# Patient Record
Sex: Female | Born: 1955 | Race: White | Hispanic: No | Marital: Married | Smoking: Former smoker
Health system: Southern US, Community
[De-identification: ages and names within clinical notes are randomized; demographics above are authoritative.]

## PROBLEM LIST (undated history)

## (undated) DIAGNOSIS — N189 Chronic kidney disease, unspecified: Secondary | ICD-10-CM

## (undated) DIAGNOSIS — F319 Bipolar disorder, unspecified: Secondary | ICD-10-CM

## (undated) DIAGNOSIS — I219 Acute myocardial infarction, unspecified: Secondary | ICD-10-CM

## (undated) DIAGNOSIS — I2510 Atherosclerotic heart disease of native coronary artery without angina pectoris: Secondary | ICD-10-CM

## (undated) DIAGNOSIS — K449 Diaphragmatic hernia without obstruction or gangrene: Secondary | ICD-10-CM

## (undated) DIAGNOSIS — Z8679 Personal history of other diseases of the circulatory system: Secondary | ICD-10-CM

## (undated) DIAGNOSIS — D649 Anemia, unspecified: Secondary | ICD-10-CM

## (undated) DIAGNOSIS — E785 Hyperlipidemia, unspecified: Secondary | ICD-10-CM

## (undated) DIAGNOSIS — Z9289 Personal history of other medical treatment: Secondary | ICD-10-CM

## (undated) DIAGNOSIS — R1310 Dysphagia, unspecified: Secondary | ICD-10-CM

## (undated) DIAGNOSIS — F329 Major depressive disorder, single episode, unspecified: Secondary | ICD-10-CM

## (undated) DIAGNOSIS — I10 Essential (primary) hypertension: Secondary | ICD-10-CM

## (undated) DIAGNOSIS — F4481 Dissociative identity disorder: Secondary | ICD-10-CM

## (undated) DIAGNOSIS — K219 Gastro-esophageal reflux disease without esophagitis: Secondary | ICD-10-CM

## (undated) DIAGNOSIS — F1111 Opioid abuse, in remission: Secondary | ICD-10-CM

## (undated) DIAGNOSIS — K222 Esophageal obstruction: Secondary | ICD-10-CM

## (undated) DIAGNOSIS — R002 Palpitations: Secondary | ICD-10-CM

## (undated) DIAGNOSIS — J449 Chronic obstructive pulmonary disease, unspecified: Secondary | ICD-10-CM

## (undated) HISTORY — DX: Major depressive disorder, single episode, unspecified: F32.9

## (undated) HISTORY — DX: Dissociative identity disorder: F44.81

## (undated) HISTORY — DX: Essential (primary) hypertension: I10

## (undated) HISTORY — DX: Anemia, unspecified: D64.9

## (undated) HISTORY — DX: Opioid abuse, in remission: F11.11

## (undated) HISTORY — DX: Diaphragmatic hernia without obstruction or gangrene: K44.9

## (undated) HISTORY — DX: Esophageal obstruction: K22.2

## (undated) HISTORY — DX: Personal history of other diseases of the circulatory system: Z86.79

## (undated) HISTORY — PX: ABDOMINAL HYSTERECTOMY: SHX81

## (undated) HISTORY — DX: Gastro-esophageal reflux disease without esophagitis: K21.9

## (undated) HISTORY — PX: BLADDER SURGERY: SHX569

## (undated) HISTORY — DX: Personal history of other medical treatment: Z92.89

## (undated) HISTORY — DX: Dysphagia, unspecified: R13.10

## (undated) HISTORY — DX: Acute myocardial infarction, unspecified: I21.9

## (undated) HISTORY — DX: Chronic obstructive pulmonary disease, unspecified: J44.9

## (undated) HISTORY — PX: CARPAL TUNNEL RELEASE: SHX101

## (undated) HISTORY — DX: Bipolar disorder, unspecified: F31.9

## (undated) HISTORY — PX: NEPHRECTOMY: SHX65

## (undated) HISTORY — DX: Hyperlipidemia, unspecified: E78.5

---

## 1998-03-09 ENCOUNTER — Emergency Department (HOSPITAL_COMMUNITY): Admission: EM | Admit: 1998-03-09 | Discharge: 1998-03-09 | Payer: Self-pay | Admitting: Emergency Medicine

## 1998-05-29 ENCOUNTER — Ambulatory Visit (HOSPITAL_COMMUNITY): Admission: RE | Admit: 1998-05-29 | Discharge: 1998-05-29 | Payer: Self-pay | Admitting: Gastroenterology

## 1998-05-29 ENCOUNTER — Encounter: Payer: Self-pay | Admitting: Gastroenterology

## 1998-07-14 ENCOUNTER — Encounter: Payer: Self-pay | Admitting: Internal Medicine

## 1998-07-14 ENCOUNTER — Inpatient Hospital Stay (HOSPITAL_COMMUNITY): Admission: EM | Admit: 1998-07-14 | Discharge: 1998-07-16 | Payer: Self-pay | Admitting: Emergency Medicine

## 1998-07-15 ENCOUNTER — Encounter: Payer: Self-pay | Admitting: Internal Medicine

## 1998-08-08 ENCOUNTER — Ambulatory Visit (HOSPITAL_COMMUNITY): Admission: RE | Admit: 1998-08-08 | Discharge: 1998-08-08 | Payer: Self-pay | Admitting: Internal Medicine

## 1998-08-08 ENCOUNTER — Encounter: Payer: Self-pay | Admitting: Internal Medicine

## 2001-04-04 ENCOUNTER — Other Ambulatory Visit: Admission: RE | Admit: 2001-04-04 | Discharge: 2001-04-04 | Payer: Self-pay | Admitting: *Deleted

## 2001-06-06 ENCOUNTER — Encounter: Payer: Self-pay | Admitting: Gastroenterology

## 2001-06-06 ENCOUNTER — Ambulatory Visit (HOSPITAL_COMMUNITY): Admission: RE | Admit: 2001-06-06 | Discharge: 2001-06-06 | Payer: Self-pay | Admitting: Gastroenterology

## 2001-06-24 ENCOUNTER — Encounter: Payer: Self-pay | Admitting: Emergency Medicine

## 2001-06-24 ENCOUNTER — Emergency Department (HOSPITAL_COMMUNITY): Admission: EM | Admit: 2001-06-24 | Discharge: 2001-06-24 | Payer: Self-pay | Admitting: Emergency Medicine

## 2001-08-15 ENCOUNTER — Encounter: Payer: Self-pay | Admitting: *Deleted

## 2001-08-15 ENCOUNTER — Emergency Department (HOSPITAL_COMMUNITY): Admission: EM | Admit: 2001-08-15 | Discharge: 2001-08-15 | Payer: Self-pay | Admitting: Emergency Medicine

## 2001-08-23 ENCOUNTER — Encounter (INDEPENDENT_AMBULATORY_CARE_PROVIDER_SITE_OTHER): Payer: Self-pay | Admitting: *Deleted

## 2001-08-23 ENCOUNTER — Encounter: Payer: Self-pay | Admitting: Family Medicine

## 2001-08-23 ENCOUNTER — Encounter: Admission: RE | Admit: 2001-08-23 | Discharge: 2001-08-23 | Payer: Self-pay | Admitting: Family Medicine

## 2001-10-26 ENCOUNTER — Encounter: Payer: Self-pay | Admitting: Nephrology

## 2001-10-26 ENCOUNTER — Encounter: Admission: RE | Admit: 2001-10-26 | Discharge: 2001-10-26 | Payer: Self-pay | Admitting: Nephrology

## 2002-04-04 ENCOUNTER — Emergency Department (HOSPITAL_COMMUNITY): Admission: EM | Admit: 2002-04-04 | Discharge: 2002-04-04 | Payer: Self-pay | Admitting: Emergency Medicine

## 2002-09-25 ENCOUNTER — Encounter: Payer: Self-pay | Admitting: *Deleted

## 2002-09-25 ENCOUNTER — Emergency Department (HOSPITAL_COMMUNITY): Admission: EM | Admit: 2002-09-25 | Discharge: 2002-09-25 | Payer: Self-pay | Admitting: Emergency Medicine

## 2003-04-03 ENCOUNTER — Encounter: Admission: RE | Admit: 2003-04-03 | Discharge: 2003-04-03 | Payer: Self-pay | Admitting: Family Medicine

## 2003-04-03 ENCOUNTER — Encounter: Payer: Self-pay | Admitting: Family Medicine

## 2003-05-29 ENCOUNTER — Inpatient Hospital Stay (HOSPITAL_COMMUNITY): Admission: EM | Admit: 2003-05-29 | Discharge: 2003-06-06 | Payer: Self-pay | Admitting: Emergency Medicine

## 2003-05-30 ENCOUNTER — Encounter: Payer: Self-pay | Admitting: Cardiovascular Disease

## 2003-06-29 ENCOUNTER — Encounter: Admission: RE | Admit: 2003-06-29 | Discharge: 2003-06-29 | Payer: Self-pay | Admitting: Family Medicine

## 2004-05-12 ENCOUNTER — Encounter: Admission: RE | Admit: 2004-05-12 | Discharge: 2004-05-12 | Payer: Self-pay | Admitting: Family Medicine

## 2004-12-13 ENCOUNTER — Ambulatory Visit: Payer: Self-pay | Admitting: Cardiology

## 2004-12-14 ENCOUNTER — Inpatient Hospital Stay (HOSPITAL_COMMUNITY): Admission: EM | Admit: 2004-12-14 | Discharge: 2004-12-15 | Payer: Self-pay | Admitting: Emergency Medicine

## 2004-12-25 ENCOUNTER — Ambulatory Visit: Payer: Self-pay | Admitting: Internal Medicine

## 2005-03-05 ENCOUNTER — Inpatient Hospital Stay (HOSPITAL_COMMUNITY): Admission: EM | Admit: 2005-03-05 | Discharge: 2005-03-08 | Payer: Self-pay | Admitting: Emergency Medicine

## 2005-03-08 ENCOUNTER — Ambulatory Visit: Payer: Self-pay | Admitting: Psychiatry

## 2005-03-08 ENCOUNTER — Inpatient Hospital Stay (HOSPITAL_COMMUNITY): Admission: AD | Admit: 2005-03-08 | Discharge: 2005-03-11 | Payer: Self-pay | Admitting: Psychiatry

## 2005-03-12 ENCOUNTER — Other Ambulatory Visit (HOSPITAL_COMMUNITY): Admission: RE | Admit: 2005-03-12 | Discharge: 2005-04-17 | Payer: Self-pay | Admitting: Psychiatry

## 2005-05-06 ENCOUNTER — Ambulatory Visit: Payer: Self-pay | Admitting: Cardiology

## 2005-05-27 ENCOUNTER — Emergency Department (HOSPITAL_COMMUNITY): Admission: EM | Admit: 2005-05-27 | Discharge: 2005-05-27 | Payer: Self-pay | Admitting: Emergency Medicine

## 2005-07-27 DIAGNOSIS — I219 Acute myocardial infarction, unspecified: Secondary | ICD-10-CM

## 2005-07-27 HISTORY — PX: CORONARY STENT PLACEMENT: SHX1402

## 2005-07-27 HISTORY — DX: Acute myocardial infarction, unspecified: I21.9

## 2005-08-09 ENCOUNTER — Ambulatory Visit: Payer: Self-pay | Admitting: Psychiatry

## 2005-08-10 ENCOUNTER — Inpatient Hospital Stay (HOSPITAL_COMMUNITY): Admission: RE | Admit: 2005-08-10 | Discharge: 2005-08-14 | Payer: Self-pay | Admitting: Psychiatry

## 2005-08-10 ENCOUNTER — Emergency Department (HOSPITAL_COMMUNITY): Admission: EM | Admit: 2005-08-10 | Discharge: 2005-08-10 | Payer: Self-pay | Admitting: Emergency Medicine

## 2005-10-01 ENCOUNTER — Inpatient Hospital Stay (HOSPITAL_COMMUNITY): Admission: EM | Admit: 2005-10-01 | Discharge: 2005-10-08 | Payer: Self-pay | Admitting: Emergency Medicine

## 2005-10-01 ENCOUNTER — Ambulatory Visit: Payer: Self-pay | Admitting: *Deleted

## 2005-10-27 ENCOUNTER — Ambulatory Visit: Payer: Self-pay | Admitting: Cardiology

## 2005-11-23 ENCOUNTER — Ambulatory Visit: Payer: Self-pay | Admitting: Internal Medicine

## 2005-11-25 ENCOUNTER — Emergency Department (HOSPITAL_COMMUNITY): Admission: EM | Admit: 2005-11-25 | Discharge: 2005-11-25 | Payer: Self-pay | Admitting: Emergency Medicine

## 2005-11-26 ENCOUNTER — Encounter (INDEPENDENT_AMBULATORY_CARE_PROVIDER_SITE_OTHER): Payer: Self-pay | Admitting: *Deleted

## 2005-11-26 ENCOUNTER — Ambulatory Visit: Payer: Self-pay | Admitting: Internal Medicine

## 2005-11-26 ENCOUNTER — Ambulatory Visit: Payer: Self-pay | Admitting: Gastroenterology

## 2005-11-26 ENCOUNTER — Ambulatory Visit (HOSPITAL_COMMUNITY): Admission: RE | Admit: 2005-11-26 | Discharge: 2005-11-26 | Payer: Self-pay | Admitting: Emergency Medicine

## 2006-01-20 ENCOUNTER — Observation Stay (HOSPITAL_COMMUNITY): Admission: EM | Admit: 2006-01-20 | Discharge: 2006-01-21 | Payer: Self-pay | Admitting: Emergency Medicine

## 2006-01-20 ENCOUNTER — Ambulatory Visit: Payer: Self-pay | Admitting: Cardiology

## 2006-02-04 ENCOUNTER — Ambulatory Visit: Payer: Self-pay | Admitting: Cardiology

## 2006-02-17 ENCOUNTER — Encounter (INDEPENDENT_AMBULATORY_CARE_PROVIDER_SITE_OTHER): Payer: Self-pay | Admitting: *Deleted

## 2006-02-17 ENCOUNTER — Emergency Department (HOSPITAL_COMMUNITY): Admission: EM | Admit: 2006-02-17 | Discharge: 2006-02-17 | Payer: Self-pay | Admitting: Emergency Medicine

## 2006-02-23 ENCOUNTER — Ambulatory Visit: Payer: Self-pay | Admitting: Internal Medicine

## 2006-02-24 ENCOUNTER — Ambulatory Visit: Payer: Self-pay | Admitting: Internal Medicine

## 2006-03-26 ENCOUNTER — Ambulatory Visit (HOSPITAL_COMMUNITY): Admission: RE | Admit: 2006-03-26 | Discharge: 2006-03-26 | Payer: Self-pay | Admitting: Urology

## 2006-03-26 ENCOUNTER — Encounter (INDEPENDENT_AMBULATORY_CARE_PROVIDER_SITE_OTHER): Payer: Self-pay | Admitting: *Deleted

## 2006-04-20 ENCOUNTER — Emergency Department (HOSPITAL_COMMUNITY): Admission: EM | Admit: 2006-04-20 | Discharge: 2006-04-20 | Payer: Self-pay | Admitting: Emergency Medicine

## 2006-08-05 ENCOUNTER — Ambulatory Visit: Payer: Self-pay | Admitting: Cardiology

## 2006-08-23 ENCOUNTER — Ambulatory Visit: Payer: Self-pay

## 2006-08-23 ENCOUNTER — Encounter: Payer: Self-pay | Admitting: Cardiology

## 2006-08-23 LAB — CONVERTED CEMR LAB
ALT: 15 units/L (ref 0–40)
AST: 20 units/L (ref 0–37)
Alkaline Phosphatase: 60 units/L (ref 39–117)
Bilirubin, Direct: 0.1 mg/dL (ref 0.0–0.3)
Cholesterol: 334 mg/dL (ref 0–200)
Direct LDL: 236.7 mg/dL
Total Bilirubin: 0.6 mg/dL (ref 0.3–1.2)
Triglycerides: 216 mg/dL (ref 0–149)
VLDL: 43 mg/dL — ABNORMAL HIGH (ref 0–40)

## 2006-09-02 ENCOUNTER — Ambulatory Visit: Payer: Self-pay | Admitting: Cardiology

## 2006-11-15 ENCOUNTER — Emergency Department (HOSPITAL_COMMUNITY): Admission: EM | Admit: 2006-11-15 | Discharge: 2006-11-15 | Payer: Self-pay | Admitting: Emergency Medicine

## 2006-12-07 ENCOUNTER — Ambulatory Visit: Payer: Self-pay | Admitting: Cardiology

## 2006-12-22 ENCOUNTER — Ambulatory Visit: Payer: Self-pay | Admitting: Cardiology

## 2006-12-22 LAB — CONVERTED CEMR LAB
Calcium: 9.3 mg/dL (ref 8.4–10.5)
Chloride: 107 meq/L (ref 96–112)
Creatinine, Ser: 1 mg/dL (ref 0.4–1.2)
GFR calc Af Amer: 75 mL/min

## 2007-03-08 ENCOUNTER — Ambulatory Visit: Payer: Self-pay | Admitting: Internal Medicine

## 2007-03-08 ENCOUNTER — Observation Stay (HOSPITAL_COMMUNITY): Admission: EM | Admit: 2007-03-08 | Discharge: 2007-03-09 | Payer: Self-pay | Admitting: Emergency Medicine

## 2007-03-08 ENCOUNTER — Encounter (INDEPENDENT_AMBULATORY_CARE_PROVIDER_SITE_OTHER): Payer: Self-pay | Admitting: *Deleted

## 2007-10-04 ENCOUNTER — Observation Stay (HOSPITAL_COMMUNITY): Admission: EM | Admit: 2007-10-04 | Discharge: 2007-10-05 | Payer: Self-pay | Admitting: Emergency Medicine

## 2007-10-04 ENCOUNTER — Ambulatory Visit: Payer: Self-pay | Admitting: Cardiovascular Disease

## 2007-10-05 ENCOUNTER — Encounter (INDEPENDENT_AMBULATORY_CARE_PROVIDER_SITE_OTHER): Payer: Self-pay | Admitting: *Deleted

## 2007-10-11 ENCOUNTER — Ambulatory Visit: Payer: Self-pay

## 2007-11-21 ENCOUNTER — Ambulatory Visit: Payer: Self-pay | Admitting: Cardiology

## 2008-02-23 ENCOUNTER — Emergency Department (HOSPITAL_COMMUNITY): Admission: EM | Admit: 2008-02-23 | Discharge: 2008-02-23 | Payer: Self-pay | Admitting: Emergency Medicine

## 2008-02-23 ENCOUNTER — Ambulatory Visit: Payer: Self-pay | Admitting: Cardiology

## 2008-09-26 ENCOUNTER — Observation Stay (HOSPITAL_COMMUNITY): Admission: EM | Admit: 2008-09-26 | Discharge: 2008-09-30 | Payer: Self-pay | Admitting: Emergency Medicine

## 2008-10-23 DIAGNOSIS — F17200 Nicotine dependence, unspecified, uncomplicated: Secondary | ICD-10-CM | POA: Insufficient documentation

## 2008-10-23 DIAGNOSIS — F329 Major depressive disorder, single episode, unspecified: Secondary | ICD-10-CM | POA: Insufficient documentation

## 2008-10-23 DIAGNOSIS — F4481 Dissociative identity disorder: Secondary | ICD-10-CM | POA: Insufficient documentation

## 2008-10-23 DIAGNOSIS — D649 Anemia, unspecified: Secondary | ICD-10-CM | POA: Insufficient documentation

## 2008-10-23 DIAGNOSIS — I2510 Atherosclerotic heart disease of native coronary artery without angina pectoris: Secondary | ICD-10-CM | POA: Insufficient documentation

## 2008-10-23 DIAGNOSIS — F1910 Other psychoactive substance abuse, uncomplicated: Secondary | ICD-10-CM

## 2008-10-23 DIAGNOSIS — K219 Gastro-esophageal reflux disease without esophagitis: Secondary | ICD-10-CM

## 2008-10-23 DIAGNOSIS — I10 Essential (primary) hypertension: Secondary | ICD-10-CM | POA: Insufficient documentation

## 2008-10-23 DIAGNOSIS — F319 Bipolar disorder, unspecified: Secondary | ICD-10-CM | POA: Insufficient documentation

## 2008-10-23 DIAGNOSIS — E785 Hyperlipidemia, unspecified: Secondary | ICD-10-CM

## 2008-10-23 DIAGNOSIS — J439 Emphysema, unspecified: Secondary | ICD-10-CM | POA: Insufficient documentation

## 2008-10-24 ENCOUNTER — Ambulatory Visit: Payer: Self-pay | Admitting: Pulmonary Disease

## 2008-10-24 DIAGNOSIS — R0602 Shortness of breath: Secondary | ICD-10-CM | POA: Insufficient documentation

## 2008-11-08 ENCOUNTER — Ambulatory Visit: Payer: Self-pay | Admitting: Pulmonary Disease

## 2008-11-20 ENCOUNTER — Telehealth (INDEPENDENT_AMBULATORY_CARE_PROVIDER_SITE_OTHER): Payer: Self-pay | Admitting: *Deleted

## 2008-11-26 ENCOUNTER — Telehealth (INDEPENDENT_AMBULATORY_CARE_PROVIDER_SITE_OTHER): Payer: Self-pay | Admitting: *Deleted

## 2009-02-12 ENCOUNTER — Telehealth: Payer: Self-pay | Admitting: Internal Medicine

## 2009-03-26 ENCOUNTER — Encounter: Admission: RE | Admit: 2009-03-26 | Discharge: 2009-03-26 | Payer: Self-pay | Admitting: Family Medicine

## 2009-03-26 ENCOUNTER — Encounter (INDEPENDENT_AMBULATORY_CARE_PROVIDER_SITE_OTHER): Payer: Self-pay | Admitting: *Deleted

## 2009-05-11 ENCOUNTER — Emergency Department (HOSPITAL_COMMUNITY): Admission: EM | Admit: 2009-05-11 | Discharge: 2009-05-11 | Payer: Self-pay | Admitting: Emergency Medicine

## 2010-02-20 ENCOUNTER — Encounter: Admission: RE | Admit: 2010-02-20 | Discharge: 2010-02-20 | Payer: Self-pay | Admitting: Family Medicine

## 2010-06-17 ENCOUNTER — Telehealth: Payer: Self-pay | Admitting: Cardiology

## 2010-07-07 ENCOUNTER — Ambulatory Visit: Payer: Self-pay | Admitting: Cardiology

## 2010-07-07 ENCOUNTER — Encounter: Payer: Self-pay | Admitting: Cardiology

## 2010-07-07 DIAGNOSIS — R002 Palpitations: Secondary | ICD-10-CM

## 2010-07-10 ENCOUNTER — Telehealth: Payer: Self-pay | Admitting: Cardiology

## 2010-07-16 ENCOUNTER — Telehealth (INDEPENDENT_AMBULATORY_CARE_PROVIDER_SITE_OTHER): Payer: Self-pay | Admitting: Radiology

## 2010-07-17 ENCOUNTER — Encounter: Payer: Self-pay | Admitting: Cardiology

## 2010-07-17 ENCOUNTER — Ambulatory Visit: Payer: Self-pay

## 2010-07-17 ENCOUNTER — Ambulatory Visit (HOSPITAL_COMMUNITY)
Admission: RE | Admit: 2010-07-17 | Discharge: 2010-07-17 | Payer: Self-pay | Source: Home / Self Care | Attending: Cardiology | Admitting: Cardiology

## 2010-07-17 ENCOUNTER — Ambulatory Visit: Payer: Self-pay | Admitting: Cardiology

## 2010-07-17 ENCOUNTER — Encounter (HOSPITAL_COMMUNITY)
Admission: RE | Admit: 2010-07-17 | Discharge: 2010-08-26 | Payer: Self-pay | Source: Home / Self Care | Attending: Cardiology | Admitting: Cardiology

## 2010-07-29 ENCOUNTER — Telehealth: Payer: Self-pay | Admitting: Cardiology

## 2010-07-29 ENCOUNTER — Inpatient Hospital Stay (HOSPITAL_COMMUNITY)
Admission: EM | Admit: 2010-07-29 | Discharge: 2010-07-30 | Payer: Self-pay | Source: Home / Self Care | Attending: Cardiology | Admitting: Cardiology

## 2010-07-30 LAB — LIPID PANEL
Cholesterol: 136 mg/dL (ref 0–200)
HDL: 41 mg/dL (ref >39)
LDL Cholesterol: 70 mg/dL (ref 0–99)
Total CHOL/HDL Ratio: 3.3 RATIO
Triglycerides: 123 mg/dL (ref <150)
VLDL: 25 mg/dL (ref 0–40)

## 2010-07-30 LAB — GLUCOSE, CAPILLARY
Glucose-Capillary: 119 mg/dL — ABNORMAL HIGH (ref 70–99)
Glucose-Capillary: 107 mg/dL — ABNORMAL HIGH (ref 70–99)
Glucose-Capillary: 151 mg/dL — ABNORMAL HIGH (ref 70–99)

## 2010-07-30 LAB — CARDIAC PANEL(CRET KIN+CKTOT+MB+TROPI)
CK, MB: 1 ng/mL (ref 0.3–4.0)
Relative Index: INVALID (ref 0.0–2.5)
Total CK: 58 U/L (ref 7–177)
Troponin I: 0.01 ng/mL (ref 0.00–0.06)

## 2010-07-30 LAB — BASIC METABOLIC PANEL
BUN: 19 mg/dL (ref 6–23)
CO2: 33 mEq/L — ABNORMAL HIGH (ref 19–32)
Calcium: 8.9 mg/dL (ref 8.4–10.5)
Chloride: 103 mEq/L (ref 96–112)
Creatinine, Ser: 1.29 mg/dL — ABNORMAL HIGH (ref 0.4–1.2)
GFR calc Af Amer: 52 mL/min — ABNORMAL LOW (ref >60)
GFR calc non Af Amer: 43 mL/min — ABNORMAL LOW (ref >60)
Glucose, Bld: 107 mg/dL — ABNORMAL HIGH (ref 70–99)
Potassium: 4 mEq/L (ref 3.5–5.1)
Sodium: 144 mEq/L (ref 135–145)

## 2010-07-30 LAB — CBC
HCT: 38 % (ref 36.0–46.0)
Hemoglobin: 12 g/dL (ref 12.0–15.0)
MCH: 32.4 pg (ref 26.0–34.0)
MCHC: 31.6 g/dL (ref 30.0–36.0)
MCV: 102.7 fL — ABNORMAL HIGH (ref 78.0–100.0)
Platelets: 190 10*3/uL (ref 150–400)
RBC: 3.7 MIL/uL — ABNORMAL LOW (ref 3.87–5.11)
RDW: 13.7 % (ref 11.5–15.5)
WBC: 11.7 10*3/uL — ABNORMAL HIGH (ref 4.0–10.5)

## 2010-07-30 LAB — HEPARIN LEVEL (UNFRACTIONATED)
Heparin Unfractionated: 0.3 IU/mL (ref 0.30–0.70)
Heparin Unfractionated: 0.29 IU/mL — ABNORMAL LOW (ref 0.30–0.70)

## 2010-08-01 ENCOUNTER — Telehealth: Payer: Self-pay | Admitting: Cardiology

## 2010-08-16 ENCOUNTER — Encounter: Payer: Self-pay | Admitting: Family Medicine

## 2010-08-17 ENCOUNTER — Encounter: Payer: Self-pay | Admitting: Family Medicine

## 2010-08-17 ENCOUNTER — Encounter: Payer: Self-pay | Admitting: Urology

## 2010-08-20 ENCOUNTER — Encounter: Payer: Self-pay | Admitting: Cardiology

## 2010-08-20 ENCOUNTER — Ambulatory Visit
Admission: RE | Admit: 2010-08-20 | Discharge: 2010-08-20 | Payer: Self-pay | Source: Home / Self Care | Attending: Cardiology | Admitting: Cardiology

## 2010-08-22 ENCOUNTER — Telehealth: Payer: Self-pay | Admitting: Internal Medicine

## 2010-08-26 NOTE — Progress Notes (Signed)
Summary: pt needs letter/medication   Phone Note Call from Patient Call back at Home Phone [phone]      Caller: Patient Reason for Call: Talk to Nurse Summary of Call: pt need a letter to go back to work. pt states he also needs a refill for his blood pressure medication. [facility]# [phone] Initial call taken by: Roe Coombs,  June 17, 2010 2:05 PM     Appended Document: pt needs letter/medication ? Do not see an office visit with me or in the cardiology office since 2008.  Was this patient in the hospital  Appended Document: pt needs letter/medication 06/10/10--0845--attempted to phone pt, but no answer--appears pt is seen in our [place] office--will attempt to phone pt later and advise she needs to make appoint in [place]?--nt

## 2010-08-28 NOTE — Progress Notes (Signed)
Summary: nuc pre-procedure  Phone Note Outgoing Call   Call placed by: Harlow Asa CNMT Call placed to: Patient Reason for Call: Confirm/change Appt Summary of Call: Reviewed information on Myoview Information Sheet (see scanned document for further details).  Spoke with patient.      Nuclear Med Background Indications for Stress Test: Evaluation for Ischemia, Stent Patency   History: Asthma, COPD, Echo, Heart Catheterization, Myocardial Infarction, Myocardial Perfusion Study, Stents  History Comments: '07 MI sm. non q wave / stent LAD / cath ; '09 MPI no scar / isch. EF = 72% ; '08 Echo EF= 65%  Symptoms: Chest Pain with Exertion, DOE, Palpitations, SOB  Symptoms Comments: swelling hands & feet   Nuclear Pre-Procedure Cardiac Risk Factors: Family History - CAD, Hypertension, Lipids, NIDDM, Smoker Height (in): 66

## 2010-08-28 NOTE — Assessment & Plan Note (Signed)
Summary: EC6/ PREVIOUS PT OF DR. MCDOWELL.GD  Medications Added TOPROL XL 50 MG XR24H-TAB (METOPROLOL SUCCINATE) once daily TOPROL XL 50 MG XR24H-TAB (METOPROLOL SUCCINATE) one and one-half tablets daily CRESTOR 40 MG TABS (ROSUVASTATIN CALCIUM) Take one tablet by mouth daily. ASPIRIN 81 MG TBEC (ASPIRIN) two daily ASPIRIN EC 325 MG TBEC (ASPIRIN) Take one tablet by mouth daily      Allergies Added:   Visit Type:  np Primary Viola Placeres:  [name], MD  CC:  palpitations, SOB, and swelling in hands and feet.  History of Present Illness: 55 yo former patient of Dr. Ival Bible with history of CAD and COPD presents for evaluation of palpitations and dyspnea.  Patient is short of breath after walking about 50 feet.  This seems to have been slowly progressive over the last couple of years.  She does have symptoms consistent with orthopnea and sleeps with the head of her bed raised.  She has been gaining weight, she thinks she is up 35 lbs this year.  She continues to smoke a pack a day.  She has episodes of palpitations (like "skipped beats") that have been going on for about 6 months.  She notes these daily.  She gets very rare chest pain, only 3-4 times a year, and never severe.  She has had chest pain with vacuuming, but has also gotten it when not doing anything.  She has some daytime sleepiness but not severe and snores loudly per her children.   ECG: Sinus tachycardia at 111, LAE  Problems Prior to Update: 1)  Dyspnea  (ICD-786.05) 2)  Anemia, Normocytic  (ICD-285.9) 3)  Tobacco Abuse  (ICD-305.1) 4)  Depression, Major  (ICD-296.20) 5)  Hx of Narcotic Abuse  (ICD-305.90) 6)  Bipolar Disorder Unspecified  (ICD-296.80) 7)  Multiple Personality Disorder  (ICD-300.14) 8)  Hypertension  (ICD-401.9) 9)  Hyperlipidemia  (ICD-272.4) 10)  G E R D  (ICD-530.81) 11)  C O P D  (ICD-496) 12)  Coronary Heart Disease  (ICD-414.00)  Current Medications (verified): 1)  Cymbalta 60 Mg Cpep  (Duloxetine Hcl) .... Take 1 Tablet By Mouth Two Times A Day 2)  Plavix 75 Mg Tabs (Clopidogrel Bisulfate) .... Take 1 Tablet By Mouth Once A Day 3)  Toprol Xl 50 Mg Xr24h-Tab (Metoprolol Succinate) .... Once Daily 4)  Lasix 40 Mg Tabs (Furosemide) .... Take 1 Tablet By Mouth Two Times A Day 5)  Klor-Con 10 10 Meq Cr-Tabs (Potassium Chloride) .... Take 3 Tabs By Mouth Daily 6)  Zyprexa 15 Mg Tabs (Olanzapine) .... Take 2 Tablets By Mouth At Bedtime 7)  Metformin Hcl 500 Mg Tabs (Metformin Hcl) .... Take 2 Tabs By Mouth Daily 8)  Symbicort 160-4.5 Mcg/act  Aero (Budesonide-Formoterol Fumarate) .... Inhale 2 Puffs Two Times A Day 9)  Crestor 40 Mg Tabs (Rosuvastatin Calcium) .... Take One Tablet By Mouth Daily. 10)  Aspirin Ec 325 Mg Tbec (Aspirin) .... Take One Tablet By Mouth Daily  Allergies (verified): 1)  ! * Vantin 2)  Erythromycin Ethylsuccinate (Erythromycin Ethylsuccinate) 3)  Cephalexin (Cephalexin) 4)  Trazodone Hcl (Trazodone Hcl)  Past History:  Past Medical History: 1. ANEMIA, NORMOCYTIC (ICD-285.9) 3. DEPRESSION, MAJOR (ICD-296.20) 3. Hx of NARCOTIC ABUSE (ICD-305.90) 4. BIPOLAR DISORDER UNSPECIFIED (ICD-296.80) 5. MULTIPLE PERSONALITY DISORDER (ICD-300.14) 6. HYPERTENSION (ICD-401.9) 7. HYPERLIPIDEMIA (ICD-272.4) 8. G E R D (ICD-530.81) 9. C O P D (ICD-496): Active smoker.  PFTs (4/10): FEV1 66%, ratio 64%, DLCO 67%.  10. CORONARY HEART DISEASE (ICD-414.00): Cypher DES to  mid LAD in 2007.  Myoview 3/09 with EF 72%, no ischemia or infarction.  11. Partial nephrectomy 1997 12. Diabetes mellitus 13. Echo (1/08): EF 65%, no significant valvular abnormalities.   Family History: emphysema: mother asthma: mother heart disease: father with MI at 83, brother and sister with MIs in their 40s cancer: mother (ovarian)   Social History: Smokes 1 ppd pt is married with children, lives in [place]. Unemployed  Review of Systems       All systems reviewed and negative  except as per HPI.   Vital Signs:  Patient profile:   55 year old female Height:      66 inches Weight:      235.50 pounds BMI:     38.15 Pulse rate:   121 / minute BP sitting:   140 / 76  (left arm) Cuff size:   large  Vitals Entered By: Caralee Ates CMA (July 07, 2010 11:14 AM)  Physical Exam  General:  Well developed, well nourished, in no acute distress.  Obese.  Head:  normocephalic and atraumatic Nose:  no deformity, discharge, inflammation, or lesions Mouth:  Teeth, gums and palate normal. Oral mucosa normal. Neck:  Neck thick, no JVD. No masses, thyromegaly or abnormal cervical nodes. Lungs:  Clear bilaterally to auscultation and percussion. Heart:  Non-displaced PMI, chest non-tender; regular rate and rhythm, S1, S2 without murmurs, rubs or gallops. Carotid upstroke normal, no bruit. Pedals normal pulses. No edema, no varicosities. Abdomen:  Bowel sounds positive; abdomen soft and non-tender without masses, organomegaly, or hernias noted. No hepatosplenomegaly. Extremities:  No clubbing or cyanosis. Neurologic:  Alert and oriented x 3. Skin:  Intact without lesions or rashes. Psych:  Normal affect.   Impression & Recommendations:  Problem # 1:  PALPITATIONS (ICD-785.1) Palpitations do sound most consistent with premature beats based on history.  They are bothersome.  I will get a 48 hour holter.  As her BP is also elevated, I will increase Toprol XL to 75 mg daily.   Problem # 2:  DYSPNEA (ICD-786.05) Progressive exertional dyspnea.  This may be primarily due to weight gain and emphysema.  However, she has a history of CAD.  Cannot fully rule out ischemic diastolic dysfunction.  I will arrange for her to get an ETT-myoview to assess for ischemia.  I will also get an echo to assess chamber function and valves.  We will check a BNP.   Problem # 3:  TOBACCO ABUSE (ICD-305.1) She needs to quit smoking.  We discussed this today.  She is going to try the electronic  cigarette.   Problem # 4:  CORONARY HEART DISEASE (ICD-414.00) ETT-myoview for exertional dyspnea, as described above.  She asks about stopping Plavix.  If the stress test is normal, she can stop the Plavix (PCI was in 2007).  I will also have her decrease ASA to 162 mg daily. Continue statin with goal LDL < 70.  Will get 10/11 lipid profile from Dr. Hayden Rasmussen office.   Other Orders: Echocardiogram (Echo) Holter Monitor (Holter Monitor) Nuclear Stress Test (Nuc Stress Test) TLB-BNP (B-Natriuretic Peptide) (83880-BNPR)  Patient Instructions: 1)  Your physician has recommended you make the following change in your medication:  2)  Decrease Aspirin 162mg  --this will be two 81mg  aspirin daily. 3)  Increase Toprol XL (metoprolol succinate)  to 75mg  daily--this will be one and one-half 50mg  tablets daily. 4)  Lab today--BNP 786.09 414.01 785.1 5)  Your physician has requested that you have an echocardiogram.  Echocardiography is a painless test that uses sound waves to create images of your heart. It provides your doctor with information about the size and shape of your heart and how well your heart's chambers and valves are working.  This procedure takes approximately one hour. There are no restrictions for this procedure. 6)  Your physician has requested that you have an exercise stress myoview.  For further information please visit [email or web address]  Please follow instruction sheet, as given. 7)  Your physician has recommended that you wear a holter monitor.  Holter monitors are medical devices that record the heart's electrical activity. Doctors most often use these monitors to diagnose arrhythmias. Arrhythmias are problems with the speed or rhythm of the heartbeat. The monitor is a small, portable device. You can wear one while you do your normal daily activities. This is usually used to diagnose what is causing palpitations/syncope (passing out). 48 hour monitor  8)  Use an electronic cigarrette  to help you stop smoking. You have the prescription. 9)  Your physician recommends that you schedule a follow-up appointment in: 3 weeks with Dr Shirlee Latch. Prescriptions: TOPROL XL 50 MG XR24H-TAB (METOPROLOL SUCCINATE) one and one-half tablets daily  #45 x 6   Entered by:   Katina Dung, RN, BSN   Authorized by:   Marca Ancona, MD   Signed by:   Katina Dung, RN, BSN on 07/07/2010   Method used:   Electronically to        [facility] [phone]* (retail)       [address]       [place], [place]  [address]       Ph: [phone]       Fax: [phone]   RxID:   [id number]

## 2010-08-28 NOTE — Procedures (Signed)
Summary: Summary Report  Summary Report   Imported By: Erle Crocker 08/13/2010 16:20:34  _____________________________________________________________________  External Attachment:    Type:   Image     Comment:   External Document

## 2010-08-28 NOTE — Assessment & Plan Note (Signed)
Summary: f3w per check out 07/07/10/lg  Medications Added CYMBALTA 60 MG CPEP (DULOXETINE HCL) Take 1 tablet once a day METFORMIN HCL 500 MG TABS (METFORMIN HCL) take 1  tabs by mouth daily IMDUR 30 MG XR24H-TAB (ISOSORBIDE MONONITRATE) once daily LISINOPRIL 5 MG TABS (LISINOPRIL) one daily      Allergies Added:   Visit Type:  eph  -  3 week follow up Primary Provider:  None currently  CC:  pt stated feeling better.  History of Present Illness: 55 yo former patient of Dr. Ival Bible with history of CAD and COPD presents for followup.  When I initially saw her, she reported atypical chest pain and exertional dyspnea.  I did an echo which showed moderate diastolic dysfunction and EF 55-60%.  Steffanie Dunn was also done showing no evidence for ischemia or infarction.  She then had a severe episode of chest pain and went into the hospital.  She had a left heart cath done showing patent LAD stent with no obstructive disease.  Since discharge, she has had 1 episode of chest pain that was actually exertional (after walking about 1/2 block).  She took 2 nitroglycerines with eventual relief.  She has quit smoking and is actually breathing a lot better with more stamina.  No groin complications post-cath.  No further significant palpitations and holter showed only occasional PVCs.   ECG: NSR, normal  Labs (12/11): BNP 40 Labs (1/12): HCT 38, D dimer negative, LDL 70, HDL 41, creatinine 1.29, TSH  Preventive Screening-Counseling & Management  Alcohol-Tobacco     Smoking Status: quit  Current Medications (verified): 1)  Cymbalta 60 Mg Cpep (Duloxetine Hcl) .... Take 1 Tablet Once A Day 2)  Toprol Xl 50 Mg Xr24h-Tab (Metoprolol Succinate) .... One and One-Half Tablets Daily 3)  Lasix 40 Mg Tabs (Furosemide) .... Take 1 Tablet By Mouth Two Times A Day 4)  Klor-Con 10 10 Meq Cr-Tabs (Potassium Chloride) .... Take 3 Tabs By Mouth Daily 5)  Zyprexa 15 Mg Tabs (Olanzapine) .... Take 2 Tablets By  Mouth At Bedtime 6)  Metformin Hcl 500 Mg Tabs (Metformin Hcl) .... Take 1  Tabs By Mouth Daily 7)  Symbicort 160-4.5 Mcg/act  Aero (Budesonide-Formoterol Fumarate) .... Inhale 2 Puffs Two Times A Day 8)  Crestor 40 Mg Tabs (Rosuvastatin Calcium) .... Take One Tablet By Mouth Daily. 9)  Aspirin 81 Mg Tbec (Aspirin) .... Two Daily 10)  Nitrostat 0.4 Mg Subl (Nitroglycerin) .Marland Kitchen.. 1 Tablet Under Tongue At Onset of Chest Pain; You May Repeat Every 5 Minutes For Up To 3 Doses. 11)  Imdur 30 Mg Xr24h-Tab (Isosorbide Mononitrate) .... Once Daily  Allergies (verified): 1)  ! * Vantin 2)  Erythromycin Ethylsuccinate (Erythromycin Ethylsuccinate) 3)  Cephalexin (Cephalexin) 4)  Trazodone Hcl (Trazodone Hcl)  Past History:  Past Medical History: 1. ANEMIA, NORMOCYTIC (ICD-285.9) 3. DEPRESSION, MAJOR (ICD-296.20) 3. Hx of NARCOTIC ABUSE (ICD-305.90) 4. BIPOLAR DISORDER UNSPECIFIED (ICD-296.80) 5. MULTIPLE PERSONALITY DISORDER (ICD-300.14) 6. HYPERTENSION (ICD-401.9) 7. HYPERLIPIDEMIA (ICD-272.4) 8. G E R D (ICD-530.81) 9. C O P D (ICD-496): Quit smoking in 12/11.  PFTs (4/10): FEV1 66%, ratio 64%, DLCO 67%.  10. CORONARY HEART DISEASE (ICD-414.00): Cypher DES to mid LAD in 2007.  Myoview 3/09 with EF 72%, no ischemia or infarction.  Myoview (12/11) EF 73%, suspected anterior soft tissue attenuation with no ischemia or infarction.  11. Partial nephrectomy 1997 12. Diabetes mellitus 13. Echo (1/08): EF 65%, no significant valvular abnormalities.  Echo (12/11): EF 55-60%, grade II  diastolic dysfunction, normal wall motion, mild left atrial enlargement.  14. Palpitations: Holter (12/11) with occasional PVCs  Family History: Reviewed history from 07/31/2010 and no changes required. emphysema: mother asthma: mother heart disease: father with MI at 77, brother and sister with MIs in their 57s cancer: mother (ovarian)   Social History: Reviewed history from 07/31/2010 and no changes required. pt  is married with children, lives in [place]. Unemployed Tobacco Use - Former.  07/07/10  Review of Systems       All systems reviewed and negative except as per HPI.   Vital Signs:  Patient profile:   55 year old female Height:      66 inches Weight:      243.50 pounds BMI:     39.44 Pulse rate:   79 / minute BP sitting:   124 / 62  (left arm) Cuff size:   regular  Vitals Entered By: Caralee Ates CMA (August 20, 2010 1:52 PM)  Physical Exam  General:  Well developed, well nourished, in no acute distress.  Obese.  Neck:  Neck thick, no JVD. No masses, thyromegaly or abnormal cervical nodes. Lungs:  Clear bilaterally to auscultation and percussion. Heart:  Non-displaced PMI, chest non-tender; regular rate and rhythm, S1, S2 without murmurs, rubs or gallops. Carotid upstroke normal, no bruit. Pedals normal pulses. No edema, no varicosities. Abdomen:  Bowel sounds positive; abdomen soft and non-tender without masses, organomegaly, or hernias noted. No hepatosplenomegaly. Extremities:  No clubbing or cyanosis. Neurologic:  Alert and oriented x 3. Psych:  Normal affect.   Impression & Recommendations:  Problem # 1:  PALPITATIONS (ICD-785.1) Essentially resolved.  Holter with occasional PVCs.   Problem # 2:  DYSPNEA (ICD-786.05) Improved since she quit smoking.  Suspect combination of COPD, obesity/deconditioning, diastolic dysfunction (moderate on echo).  Appears euvolemic.  Continue current Lasix dose.  Needs to exercise (walk).   Problem # 3:  HYPERTENSION (ICD-401.9) BP well-controlled.   Problem # 4:  HYPERLIPIDEMIA (ICD-272.4) LDL at goal 12/11 (< 70).   Problem # 5:  CORONARY HEART DISEASE (ICD-414.00) Patent LAD stent on recent cath with no obstructive disease.  It is possible that her chest pain symptoms are due to microvascular angina.  She should continue aggressive risk factor management with ASA, statin, Toprol XL.  Will add ACEI, lisinopril 5 mg daily with BMET  in 2 wks.   She is working on getting a new PCP.   Patient Instructions: 1)  Your physician has recommended you make the following change in your medication:  2)  Start Lisinopril 5mg  daily. 3)  Lab in 2 weeks---BMP 786.50  414.01 4)  Your physician wants you to follow-up in: 1 year with Dr Shirlee Latch.   You will receive a reminder letter in the mail two months in advance. If you don't receive a letter, please call our office to schedule the follow-up appointment. Prescriptions: LISINOPRIL 5 MG TABS (LISINOPRIL) one daily  #30 x 6   Entered by:   Katina Dung, RN, BSN   Authorized by:   Marca Ancona, MD   Signed by:   Katina Dung, RN, BSN on 08/20/2010   Method used:   Electronically to        [facility] [phone]* (retail)       [address]       [place], [place]  [address]       Ph: [phone]       Fax: [phone]   RxID:  [id number]  

## 2010-08-28 NOTE — Progress Notes (Signed)
Summary: PT RTN CALL   Phone Note Call from Patient Call back at Home Phone [phone] Call back at [phone]   Caller: Patient Reason for Call: Talk to Nurse, Talk to Doctor Summary of Call: PT RTN CALL FROM YESTERDAY Initial call taken by: Omer Jack,  July 10, 2010 10:10 AM  Follow-up for Phone Call        RN s/w Pt: unsure who called. Orders are pending to be scheduled ; s/w Sharonda: Someone will call Pt re: scheduling tests. Pt provided another contact number [phone].  Follow-up by: Bernita Raisin, RN, BSN,  July 10, 2010 12:33 PM

## 2010-08-28 NOTE — Procedures (Signed)
Summary: summary report  summary report   Imported By: Mirna Mires 08/01/2010 14:55:29  _____________________________________________________________________  External Attachment:    Type:   Image     Comment:   External Document

## 2010-08-28 NOTE — Assessment & Plan Note (Signed)
Summary: Cardiology Nuclear Testing  Nuclear Med Background Indications for Stress Test: Evaluation for Ischemia, Stent Patency   History: Asthma, COPD, Echo, Heart Catheterization, Myocardial Infarction, Myocardial Perfusion Study, Stents  History Comments: '07 MI sm. non q wave / stent LAD / cath ; '09 MPI no scar / isch. EF = 72% ; '08 Echo EF= 65%  Symptoms: Chest Pain with Exertion, DOE, Palpitations, SOB  Symptoms Comments: swelling hands & feet   Nuclear Pre-Procedure Cardiac Risk Factors: Family History - CAD, Hypertension, Lipids, NIDDM, Smoker Caffeine/Decaff Intake: None NPO After: 8:00 PM Lungs: clear IV 0.9% NS with Angio Cath: 22g     IV Site: R Forearm IV Started by: Bonnita Levan, RN Chest Size (in) 42     Cup Size C     Height (in): 66 Weight (lb): 232 BMI: 37.58  Nuclear Med Study 1 or 2 day study:  1 day     Stress Test Type:  Eugenie Birks Reading MD:  Olga Millers, MD     Referring MD:  D.McLean Resting Radionuclide:  Technetium 1m Tetrofosmin     Resting Radionuclide Dose:  10.8 mCi  Stress Radionuclide:  Technetium 43m Tetrofosmin     Stress Radionuclide Dose:  33 mCi   Stress Protocol  Max Systolic BP: 163 mm Hg Lexiscan: 0.4 mg   Stress Test Technologist:  Milana Na, EMT-P     Nuclear Technologist:  Doyne Keel, CNMT  Rest Procedure  Myocardial perfusion imaging was performed at rest 45 minutes following the intravenous administration of Technetium 61m Tetrofosmin.  Stress Procedure  The patient received IV Lexiscan 0.4 mg over 15-seconds.  Technetium 47m Tetrofosmin injected at 30-seconds.  There were no significant changes, sob, nausea without vomiting and rare pvcs with infusion.  Quantitative spect images were obtained after a 45 minute delay.  QPS Raw Data Images:  Acquisition technically good; normal left ventricular size. Stress Images:  There is decreased uptake in the anterior wall. Rest Images:  There is decreased uptake in the  anterior wall. Subtraction (SDS):  No evidence of ischemia. Transient Ischemic Dilatation:  .96  (Normal <1.22)  Lung/Heart Ratio:  .22  (Normal <0.45)  Quantitative Gated Spect Images QGS EDV:  69 ml QGS ESV:  19 ml QGS EF:  73 % QGS cine images:  Normal wall motion.   Overall Impression  Exercise Capacity: Lexiscan with no exercise. BP Response: Normal blood pressure response. Clinical Symptoms: No chest pain ECG Impression: No significant ST segment change suggestive of ischemia. Overall Impression: Normal lexiscan nuclear study with mild soft tissue attenuation but no ischemia.  Appended Document: Cardiology Nuclear Testing soft tissue attenuation, probably normal study  Appended Document: Cardiology Nuclear Testing patient is aware of her test results for her echo and stress test.  Appended Document: Cardiology Nuclear Testing OK to stop Plavix and decrease aspirin to 162 mg daily.   Appended Document: Cardiology Nuclear Testing I called the pt and made her aware she is ok to stop plavix per Dr. Shirlee Latch- she has already decreased ASA to 162mg  once daily.- she states she has stopped smoking x two weeks. I explained I would relay this message to Dr. Shirlee Latch.

## 2010-08-28 NOTE — Progress Notes (Signed)
Summary: monitor   Phone Note Outgoing Call   Call placed by: Katina Dung, RN, BSN,  August 01, 2010 1:17 PM Call placed to: Patient Summary of Call: monitor results     Appended Document: monitor Dr Shirlee Latch reviewed monitor done 07/17/10--occ PVCs, no significant bradycardia--pt notified of results

## 2010-08-28 NOTE — Progress Notes (Signed)
Summary: CP, SOB   Phone Note Call from Patient Call back at [facility] Phone [phone]   Caller: Patient Reason for Call: Talk to Nurse Initial call taken by: Judie Grieve,  July 29, 2010 8:40 AM  Follow-up for Phone Call        I spoke with the pt and she is currently having Chest pressure (7-8/10), SOB and is lightheaded.  The pt's symptoms started last night and have been constant. The pt has been unable to sleep due to symptoms. I advised the pt to go into the ER for further evaluation.  The pt will have her husband drive her to the [facility].   Trish notified and notification faxed to ER.     Follow-up by: Julieta Gutting, RN, BSN,  July 29, 2010 8:57 AM

## 2010-09-03 ENCOUNTER — Other Ambulatory Visit (INDEPENDENT_AMBULATORY_CARE_PROVIDER_SITE_OTHER): Payer: PRIVATE HEALTH INSURANCE

## 2010-09-03 ENCOUNTER — Encounter: Payer: Self-pay | Admitting: Cardiology

## 2010-09-03 ENCOUNTER — Other Ambulatory Visit: Payer: Self-pay | Admitting: Cardiology

## 2010-09-03 DIAGNOSIS — I2510 Atherosclerotic heart disease of native coronary artery without angina pectoris: Secondary | ICD-10-CM

## 2010-09-03 DIAGNOSIS — R0989 Other specified symptoms and signs involving the circulatory and respiratory systems: Secondary | ICD-10-CM

## 2010-09-03 DIAGNOSIS — R0609 Other forms of dyspnea: Secondary | ICD-10-CM

## 2010-09-03 LAB — BASIC METABOLIC PANEL
CO2: 32 mEq/L (ref 19–32)
Chloride: 100 mEq/L (ref 96–112)
Glucose, Bld: 108 mg/dL — ABNORMAL HIGH (ref 70–99)
Potassium: 3.6 mEq/L (ref 3.5–5.1)
Sodium: 144 mEq/L (ref 135–145)

## 2010-09-03 NOTE — Progress Notes (Signed)
Summary: Medication refill  Medications Added NEXIUM 40 MG CPDR (ESOMEPRAZOLE MAGNESIUM) 1 by mouth two times a day       Phone Note Call from Patient Call back at [facility] Phone [phone]   Caller: Patient Call For: dR. Zyann Mabry Reason for Call: Talk to Nurse Summary of Call: Pt wants me to let you know that she scheduled an OV wtih Marina Goodell and wants to know if she can have a refill on her nexium Initial call taken by: [place] Johnson,  August 22, 2010 1:31 PM    New/Updated Medications: NEXIUM 40 MG CPDR (ESOMEPRAZOLE MAGNESIUM) 1 by mouth two times a day Prescriptions: NEXIUM 40 MG CPDR (ESOMEPRAZOLE MAGNESIUM) 1 by mouth two times a day  #60 x 0   Entered by:   Milford Cage NCMA   Authorized by:   Hilarie Fredrickson MD   Signed by:   Milford Cage NCMA on 08/25/2010   Method used:   Electronically to        [facility] [phone]* (retail)       [address]       [place], [place]  [address]       Ph: [phone]       Fax: [phone]   RxID:   [id number]

## 2010-09-04 NOTE — Discharge Summary (Signed)
[name], [name] NO.:  [id number]  MEDICAL RECORD NO.:  [id number]          PATIENT TYPE:  INP  LOCATION:  6524                         FACILITY:  [facility]  PHYSICIAN:  [name] Ancona, MD      DATE OF BIRTH:  08-04-1955  DATE OF ADMISSION:  07/29/2010 DATE OF DISCHARGE:  07/30/2010                              DISCHARGE SUMMARY   PRIMARY CARDIOLOGIST:  [name] Ancona, MD  PRIMARY CARE PROVIDER:  [name], MD  PSYCHIATRIST:  [name], MD  DISCHARGE DIAGNOSIS:  Chest pain without objective evidence of ischemia.  SECONDARY DIAGNOSES: 1. Coronary disease status post prior left anterior descending     stenting. 2. Diabetes mellitus. 3. Anemia. 4. Thrombocytopenia. 5. Chronic obstructive pulmonary disease. 6. Hypertension. 7. Hyperlipidemia. 8. Remote tobacco abuse, quitting in December 2011. 9. Esophagitis. 10.Duodenitis. 11.History of esophageal stricture status post dilatation in 2007. 12.Bipolar disorder. 13.History of opioid abuse status post detox, January 2007. 14.History of bradycardia while on beta-blocker and clonidine in the     past.  ALLERGIES:  TRAZODONE, ERYTHROMYCIN, CEPHALOSPORINS.  PROCEDURE:  Left heart cardiac catheterization revealing patent left anterior descending stent, otherwise, nonobstructive disease and ejection fraction of 65%.  HISTORY OF PRESENT ILLNESS:  This is a 55 year old female with prior history of CAD status post non-ST-elevation MI in 2004 with nonobstructive disease at that time and subsequent stenting of the LAD with Cypher drug-eluting stent in March 2007.  She was in her usual state of health until late last month when she noted progressive dyspnea and palpitations and was seen by Dr. [name] in the office with subsequent Myoview on July 17, 2010, showing mild soft tissue attenuation but no evidence of ischemia with an EF of 73%. Unfortunately, on the evening of admission, she had  recurrent chest discomfort that was sharp and squeezing like in sensation, radiation to her jaw, associated with nausea and dyspnea.  This was incompletely relieved with sublingual nitroglycerin.  The ED ECG showed no acute changes and point-of-care markers were negative x1.  She is admitted for further evaluation.  HOSPITAL COURSE:  The patient ruled out for MI.  Decision was made to pursue diagnostic catheterization which took place earlier today revealing nonobstructive disease with patent LAD stent.  She will be discharged home this afternoon in good condition.  DISCHARGE LABORATORY DATA:  Hemoglobin 12.0, hematocrit 38.0, WBC 11.7, platelets 190.  D-dimer less than 0.22.  Sodium 144, potassium 4.0, chloride 103, CO2 is 33, BUN 19, creatinine 1.29, glucose 107, total bilirubin 0.5, alkaline phosphatase 55, AST 23, ALT 19, total protein 6.1, albumin 3.2, calcium 8.9.  CK 58, MB 1.3, troponin I 0.01.  Total cholesterol 136, triglycerides 123, HDL 41, LDL 70.  TSH 5.550.  MRSA screen was negative.  DISPOSITION:  The patient will be discharged home today in good condition.  FOLLOWUP PLANS AND APPOINTMENTS:  The patient will follow up with Dr. [name] as scheduled on August 20, 2010, at 1:45 p.m.  Follow up with Dr. Larina [name] as previously scheduled.  DISCHARGE MEDICATIONS: 1. Imdur 30 mg daily. 2. Nitroglycerin 0.4 mg sublingual p.r.n. chest pain. 3. Protonix 40 mg daily. 4. Aspirin  81 mg daily. 5. Crestor 40 mg daily. 6. Cymbalta 60 mg daily. 7. Lasix 40 mg b.i.d. 8. Metformin 500 mg 2 tablets daily, to be resumed on August 02, 2010. 9. Potassium chloride 10 mEq 3 tablets daily. 10.Symbicort 160/4.5 mcg 2 puffs b.i.d. 11.Toprol-XL 50 mg one and a half tablets daily. 12.Zyprexa 15 mg nightly.  OUTSTANDING LAB STUDIES:  None.  DURATION OF DISCHARGE ENCOUNTER:  35 minutes including physician time.     [name], ANP   ______________________________ [name] Ancona,  MD    CB/MEDQ  D:  07/30/2010  T:  07/31/2010  Job:  [id number]  cc:   [name] Gins, MD  Electronically Signed by [name] ANP on 08/18/2010 03:44:41 PM Electronically Signed by [name] Ancona MD on 09/04/2010 08:25:47 AM

## 2010-09-30 ENCOUNTER — Ambulatory Visit: Payer: Self-pay | Admitting: Internal Medicine

## 2010-10-02 NOTE — Procedures (Signed)
Summary: EGD    EGD  Procedure date:  02/24/2006  Findings:      Location:  [facility].  Findings: Stricture:  Hiatal Hernia GERD  EGD  Procedure date:  02/24/2006  Findings:      Location:  [facility].  Findings: Stricture:  Hiatal Hernia GERD Patient Name: [name], [name] MRN: [id number] Procedure Procedures: Panendoscopy (EGD) CPT: 43235.    with balloon dilation. CPT: T9508883.  Personnel: Endoscopist: Wilhemina Bonito. Marina Goodell, MD.  Exam Location: Exam performed in Outpatient Clinic. Outpatient  Patient Consent: Procedure, Alternatives, Risks and Benefits discussed, consent obtained, from patient. Consent was obtained by the RN.  Indications  Therapeutics: Reason for exam: Esophageal dilation.  Symptoms: Dysphagia. Reflux symptoms Hematemesis.  History  Current Medications: Patient is not currently taking Coumadin.  Comments: ON PLAVIX FOR DRUG ELUTING CORONARY STENT Pre-Exam Physical: Performed Feb 24, 2006  Cardio-pulmonary exam, Abdominal exam, Mental status exam WNL.  Comments: Pt. history reviewed/updated, physical exam performed prior to initiation of sedation?YES Exam Exam Info: Maximum depth of insertion Duodenum, intended Duodenum. Patient position: on left side. Vocal cords visualized. Gastric retroflexion performed. Images taken. ASA Classification: III. Tolerance: excellent.  Sedation Meds: Patient assessed and found to be appropriate for moderate (conscious) sedation. Fentanyl 150 mcg. given IV. Versed 15 given IV. Cetacaine Spray 2 sprays given aerosolized.  Monitoring: BP and pulse monitoring done. Oximetry used. Supplemental O2 given  Findings HIATAL HERNIA:  STRICTURE / STENOSIS: Stricture in Distal Esophagus.  Constriction: partial. Etiology: benign due to reflux. 35 cm from mouth. Lumen diameter is 15 mm. ICD9: Esophageal Stricture: 530.3.  - Dilation: Distal Esophagus. Balloon/Wilson-Cook dilator  used, Diameter: 18 mm, Minimal Resistance, Minimal Heme present on extraction. 18 X 2  total dilators used. Patient tolerance excellent.  - Normal: Fundus to Duodenal 2nd Portion.   Assessment  Diagnoses: 530.3: Esophageal Stricture.  553.3: Hernia, Hiatal.  530.81: GERD.   Events  Unplanned Intervention: No unplanned interventions were required.  Unplanned Events: There were no complications. Plans Medication(s): Continue current medications.  Patient Education: Patient given standard instructions for: Reflux.  Disposition: After procedure patient sent to recovery. After recovery patient sent home.  Scheduling: Follow-up prn.   This report was created from the original endoscopy report, which was reviewed and signed by the above listed endoscopist.   cc:  Talmadge Coventry, MD      Nona Dell, MD      The Patient

## 2010-10-02 NOTE — Procedures (Signed)
Summary: EGD   EGD  Procedure date:  11/26/2005  Findings:      Location: [facility]  Findings: Duodenitis  Findings: Stricture:  Findings: Esophagitis  GERD Patient Name: [name], [name] MRN: [id number] Procedure Procedures: Panendoscopy (EGD) CPT: 43235.    with biopsy(s)/brushing(s). CPT: D1846139.    with balloon dilation. CPT: T9508883.  Personnel: Endoscopist: Wilhemina Bonito. Marina Goodell, MD.  Referred By: Nona Dell, MD.  Exam Location: Exam performed in Outpatient Clinic. Outpatient  Patient Consent: Procedure, Alternatives, Risks and Benefits discussed, consent obtained, from patient. Consent was obtained by the RN.  Indications  Therapeutics: Reason for exam: Esophageal dilation.  Symptoms: Dysphagia. Reflux symptoms  History  Current Medications: Patient is not currently taking Coumadin.  Pre-Exam Physical: Performed Nov 26, 2005  Cardio-pulmonary exam, Abdominal exam, Mental status exam WNL.  Comments: Pt. history reviewed/updated, physical exam performed prior to initiation of sedation?yes Exam Exam Info: Maximum depth of insertion Duodenum, intended Duodenum. Patient position: on left side. Vocal cords visualized. Gastric retroflexion performed. Images taken. ASA Classification: III. Tolerance: excellent.  Sedation Meds: Patient assessed and found to be appropriate for moderate (conscious) sedation. Fentanyl 125 mcg. given IV. Versed 17 given IV. Cetacaine Spray given aerosolized.  Monitoring: BP and pulse monitoring done. Oximetry used. Supplemental O2 given  Fluoroscopy: Fluoroscopy was not used.  Findings HIATAL HERNIA:  STRICTURE / STENOSIS: Stricture in Distal Esophagus.  Constriction: partial. Etiology: benign due to reflux. 35 cm from mouth. Lumen diameter is 15 mm. ICD9: Esophageal Stricture: 530.3. Comment: mild esophagitis and edema.  - Dilation: Distal Esophagus. Balloon/Wilson-Cook dilator used, Diameter: 16,17,18 mm,  Minimal Resistance, No Heme present on extraction. sequential   total dilators used. Patient tolerance excellent.  - MUCOSAL ABNORMALITY: Antrum to Duodenal Apex. Erosions present. RUT done, results pending. ICD9: Duodenitis without Hemorrhage: 535.60.   Assessment  Diagnoses: 530.3: Esophageal Stricture.  535.60: Duodenitis without Hemorrhage.  530.11: Esophagitis, Reflux.  530.81: GERD.   Events  Unplanned Intervention: No unplanned interventions were required.  Unplanned Events: There were no complications. Plans Medication(s): PPI: Pantoprazole/Protonix 40 mg BID,   Patient Education: Patient given standard instructions for: Reflux.  Disposition: After procedure patient sent to recovery. After recovery patient sent home.  Scheduling: Follow-up prn.   This report was created from the original endoscopy report, which was reviewed and signed by the above listed endoscopist.   cc:  Simona Huh, MD      Talmadge Coventry, MD

## 2010-10-06 LAB — CBC
HCT: 41.7 % (ref 36.0–46.0)
MCH: 33 pg (ref 26.0–34.0)
MCHC: 32.6 g/dL (ref 30.0–36.0)
MCV: 101.2 fL — ABNORMAL HIGH (ref 78.0–100.0)
Platelets: 194 10*3/uL (ref 150–400)
RDW: 13.3 % (ref 11.5–15.5)
WBC: 8.5 10*3/uL (ref 4.0–10.5)

## 2010-10-06 LAB — DIFFERENTIAL
Basophils Absolute: 0 10*3/uL (ref 0.0–0.1)
Basophils Relative: 0 % (ref 0–1)
Eosinophils Absolute: 0.3 10*3/uL (ref 0.0–0.7)
Eosinophils Relative: 3 % (ref 0–5)
Lymphocytes Relative: 31 % (ref 12–46)
Monocytes Absolute: 0.6 10*3/uL (ref 0.1–1.0)

## 2010-10-06 LAB — BASIC METABOLIC PANEL
BUN: 17 mg/dL (ref 6–23)
Chloride: 101 mEq/L (ref 96–112)
Creatinine, Ser: 1.21 mg/dL — ABNORMAL HIGH (ref 0.4–1.2)
Glucose, Bld: 123 mg/dL — ABNORMAL HIGH (ref 70–99)

## 2010-10-06 LAB — HEPATIC FUNCTION PANEL
ALT: 19 U/L (ref 0–35)
Albumin: 3.2 g/dL — ABNORMAL LOW (ref 3.5–5.2)
Alkaline Phosphatase: 55 U/L (ref 39–117)
Indirect Bilirubin: 0.3 mg/dL (ref 0.3–0.9)
Total Protein: 6.1 g/dL (ref 6.0–8.3)

## 2010-10-06 LAB — CK TOTAL AND CKMB (NOT AT ARMC): Relative Index: INVALID (ref 0.0–2.5)

## 2010-10-06 LAB — POCT CARDIAC MARKERS

## 2010-10-06 LAB — CARDIAC PANEL(CRET KIN+CKTOT+MB+TROPI)
CK, MB: 1 ng/mL (ref 0.3–4.0)
Relative Index: INVALID (ref 0.0–2.5)
Total CK: 53 U/L (ref 7–177)
Troponin I: 0.01 ng/mL (ref 0.00–0.06)

## 2010-10-06 LAB — D-DIMER, QUANTITATIVE: D-Dimer, Quant: <0.22 ug/mL-FEU (ref 0.00–0.48)

## 2010-10-06 LAB — TROPONIN I: Troponin I: 0.01 ng/mL (ref 0.00–0.06)

## 2010-10-06 LAB — HEPARIN LEVEL (UNFRACTIONATED): Heparin Unfractionated: 0.19 IU/mL — ABNORMAL LOW (ref 0.30–0.70)

## 2010-10-06 LAB — PROTIME-INR
INR: 0.9 (ref 0.00–1.49)
Prothrombin Time: 12.4 seconds (ref 11.6–15.2)

## 2010-10-06 LAB — TSH: TSH: 5.55 u[IU]/mL — ABNORMAL HIGH (ref 0.350–4.500)

## 2010-10-06 LAB — MRSA PCR SCREENING: MRSA by PCR: NEGATIVE

## 2010-10-07 NOTE — Progress Notes (Signed)
Summary: [facility]   Imported By: Sherian Rein 09/30/2010 09:42:33  _____________________________________________________________________  External Attachment:    Type:   Image     Comment:   External Document

## 2010-10-14 ENCOUNTER — Other Ambulatory Visit: Payer: Self-pay | Admitting: Cardiology

## 2010-10-14 ENCOUNTER — Other Ambulatory Visit: Payer: Self-pay | Admitting: *Deleted

## 2010-10-14 DIAGNOSIS — R609 Edema, unspecified: Secondary | ICD-10-CM

## 2010-10-14 MED ORDER — FUROSEMIDE 20 MG PO TABS: 40.0000 mg | ORAL_TABLET | Freq: Two times a day (BID) | ORAL | Status: DC

## 2010-10-14 MED ORDER — FUROSEMIDE 40 MG PO TABS: 40.0000 mg | ORAL_TABLET | Freq: Two times a day (BID) | ORAL | Status: DC

## 2010-10-30 LAB — COMPREHENSIVE METABOLIC PANEL
ALT: 17 U/L (ref 0–35)
AST: 20 U/L (ref 0–37)
Alkaline Phosphatase: 59 U/L (ref 39–117)
CO2: 29 mEq/L (ref 19–32)
Chloride: 102 mEq/L (ref 96–112)
GFR calc Af Amer: >60 mL/min (ref >60)
GFR calc non Af Amer: 56 mL/min — ABNORMAL LOW (ref >60)
Glucose, Bld: 119 mg/dL — ABNORMAL HIGH (ref 70–99)
Sodium: 142 mEq/L (ref 135–145)
Total Bilirubin: 0.6 mg/dL (ref 0.3–1.2)

## 2010-10-30 LAB — CBC
Hemoglobin: 13.3 g/dL (ref 12.0–15.0)
MCV: 99.7 fL (ref 78.0–100.0)
RBC: 3.88 MIL/uL (ref 3.87–5.11)
WBC: 9.9 10*3/uL (ref 4.0–10.5)

## 2010-10-30 LAB — DIFFERENTIAL
Basophils Absolute: 0.1 10*3/uL (ref 0.0–0.1)
Basophils Relative: 1 % (ref 0–1)
Eosinophils Absolute: 0.3 10*3/uL (ref 0.0–0.7)
Eosinophils Relative: 3 % (ref 0–5)
Neutrophils Relative %: 60 % (ref 43–77)

## 2010-10-30 LAB — URINALYSIS, ROUTINE W REFLEX MICROSCOPIC
Bilirubin Urine: NEGATIVE
Ketones, ur: NEGATIVE mg/dL
Nitrite: NEGATIVE
Protein, ur: NEGATIVE mg/dL
Urobilinogen, UA: 1 mg/dL (ref 0.0–1.0)
pH: 6 (ref 5.0–8.0)

## 2010-10-30 LAB — LIPASE, BLOOD: Lipase: 53 U/L (ref 11–59)

## 2010-10-30 LAB — URINE MICROSCOPIC-ADD ON

## 2010-10-30 LAB — URINE CULTURE
Colony Count: NO GROWTH
Culture: NO GROWTH

## 2010-11-05 ENCOUNTER — Ambulatory Visit: Payer: Self-pay | Admitting: Internal Medicine

## 2010-11-06 LAB — HEMOGLOBIN A1C
Hgb A1c MFr Bld: 6.3 % — ABNORMAL HIGH (ref 4.6–6.1)
Mean Plasma Glucose: 134 mg/dL

## 2010-11-06 LAB — GLUCOSE, CAPILLARY
Glucose-Capillary: 142 mg/dL — ABNORMAL HIGH (ref 70–99)
Glucose-Capillary: 147 mg/dL — ABNORMAL HIGH (ref 70–99)
Glucose-Capillary: 155 mg/dL — ABNORMAL HIGH (ref 70–99)
Glucose-Capillary: 155 mg/dL — ABNORMAL HIGH (ref 70–99)
Glucose-Capillary: 196 mg/dL — ABNORMAL HIGH (ref 70–99)
Glucose-Capillary: 213 mg/dL — ABNORMAL HIGH (ref 70–99)
Glucose-Capillary: 222 mg/dL — ABNORMAL HIGH (ref 70–99)
Glucose-Capillary: 225 mg/dL — ABNORMAL HIGH (ref 70–99)

## 2010-11-06 LAB — CBC
HCT: 37.3 % (ref 36.0–46.0)
Hemoglobin: 14 g/dL (ref 12.0–15.0)
MCHC: 34.3 g/dL (ref 30.0–36.0)
MCHC: 34.4 g/dL (ref 30.0–36.0)
MCV: 97.1 fL (ref 78.0–100.0)
Platelets: 134 10*3/uL — ABNORMAL LOW (ref 150–400)
Platelets: 144 10*3/uL — ABNORMAL LOW (ref 150–400)
RBC: 3.84 MIL/uL — ABNORMAL LOW (ref 3.87–5.11)
RBC: 4.15 MIL/uL (ref 3.87–5.11)
RBC: 4.69 MIL/uL (ref 3.87–5.11)
WBC: 10.9 10*3/uL — ABNORMAL HIGH (ref 4.0–10.5)
WBC: 2.8 10*3/uL — ABNORMAL LOW (ref 4.0–10.5)

## 2010-11-06 LAB — DIFFERENTIAL
Basophils Absolute: 0 10*3/uL (ref 0.0–0.1)
Basophils Relative: 0 % (ref 0–1)
Lymphocytes Relative: 15 % (ref 12–46)
Lymphocytes Relative: 18 % (ref 12–46)
Lymphs Abs: 1.6 10*3/uL (ref 0.7–4.0)
Monocytes Relative: 9 % (ref 3–12)
Neutro Abs: 5.1 10*3/uL (ref 1.7–7.7)
Neutro Abs: 8.9 10*3/uL — ABNORMAL HIGH (ref 1.7–7.7)
Neutrophils Relative %: 73 % (ref 43–77)
Neutrophils Relative %: 82 % — ABNORMAL HIGH (ref 43–77)

## 2010-11-06 LAB — LIPID PANEL
Cholesterol: 174 mg/dL (ref 0–200)
LDL Cholesterol: 121 mg/dL — ABNORMAL HIGH (ref 0–99)
VLDL: 30 mg/dL (ref 0–40)

## 2010-11-06 LAB — EXPECTORATED SPUTUM ASSESSMENT W GRAM STAIN, RFLX TO RESP C

## 2010-11-06 LAB — MAGNESIUM: Magnesium: 1.7 mg/dL (ref 1.5–2.5)

## 2010-11-06 LAB — COMPREHENSIVE METABOLIC PANEL
Albumin: 3.2 g/dL — ABNORMAL LOW (ref 3.5–5.2)
BUN: 9 mg/dL (ref 6–23)
Creatinine, Ser: 1.2 mg/dL (ref 0.4–1.2)
Total Protein: 5.8 g/dL — ABNORMAL LOW (ref 6.0–8.3)

## 2010-11-06 LAB — TROPONIN I
Troponin I: <0.01 ng/mL (ref 0.00–0.06)
Troponin I: <0.01 ng/mL (ref 0.00–0.06)

## 2010-11-06 LAB — BASIC METABOLIC PANEL
BUN: 11 mg/dL (ref 6–23)
BUN: 16 mg/dL (ref 6–23)
CO2: 32 mEq/L (ref 19–32)
Calcium: 8.1 mg/dL — ABNORMAL LOW (ref 8.4–10.5)
Chloride: 105 mEq/L (ref 96–112)
Creatinine, Ser: 1.09 mg/dL (ref 0.4–1.2)
Creatinine, Ser: 1.12 mg/dL (ref 0.4–1.2)
GFR calc Af Amer: 59 mL/min — ABNORMAL LOW (ref >60)
GFR calc non Af Amer: 49 mL/min — ABNORMAL LOW (ref >60)
GFR calc non Af Amer: 53 mL/min — ABNORMAL LOW (ref >60)
Potassium: 3.3 mEq/L — ABNORMAL LOW (ref 3.5–5.1)
Potassium: 3.3 mEq/L — ABNORMAL LOW (ref 3.5–5.1)
Sodium: 138 mEq/L (ref 135–145)

## 2010-11-06 LAB — LEGIONELLA ANTIGEN, URINE: Legionella Antigen, Urine: NEGATIVE

## 2010-11-06 LAB — POCT I-STAT 3, ART BLOOD GAS (G3+)
O2 Saturation: 92 %
pCO2 arterial: 42.4 mmHg (ref 35.0–45.0)
pO2, Arterial: 62 mmHg — ABNORMAL LOW (ref 80.0–100.0)

## 2010-11-06 LAB — BLOOD GAS, ARTERIAL
Bicarbonate: 28 mEq/L — ABNORMAL HIGH (ref 20.0–24.0)
Patient temperature: 98.6
pH, Arterial: 7.39 (ref 7.350–7.400)

## 2010-11-06 LAB — CK TOTAL AND CKMB (NOT AT ARMC)
CK, MB: 1.9 ng/mL (ref 0.3–4.0)
CK, MB: 2.1 ng/mL (ref 0.3–4.0)
Relative Index: 0.9 (ref 0.0–2.5)
Relative Index: 1 (ref 0.0–2.5)

## 2010-11-07 ENCOUNTER — Telehealth: Payer: Self-pay | Admitting: Cardiology

## 2010-11-07 NOTE — Telephone Encounter (Signed)
Pt would like to talk to a nurse pt states two ofher meds are making her light headed.

## 2010-11-07 NOTE — Telephone Encounter (Signed)
Pt calling stating was started on 2 new meds and is now having dizziness when getting up and down--meds are lisinopril 5 mg qd and isorbide 30mg  1 qd--advised to stop each med for 5 days,one at a time, keep track of BP qd and call us with results of blood pressure checks and which med is causing dizziness--pt agrees--nt

## 2010-11-10 ENCOUNTER — Telehealth: Payer: Self-pay | Admitting: Cardiology

## 2010-11-10 NOTE — Telephone Encounter (Signed)
Pt calling re medication °

## 2010-11-10 NOTE — Telephone Encounter (Signed)
NA

## 2010-11-11 ENCOUNTER — Telehealth: Payer: Self-pay | Admitting: Cardiology

## 2010-11-11 NOTE — Telephone Encounter (Signed)
Agree with plan 

## 2010-11-11 NOTE — Telephone Encounter (Signed)
I discussed mediations with pt.

## 2010-11-11 NOTE — Telephone Encounter (Signed)
For review

## 2010-11-11 NOTE — Telephone Encounter (Signed)
Pt states she has been off Lisinopril since 11/06/10. She states she is still taking Isosorbide. Her dizziness has not improved since stopping Lisinopril. Her BP has been 140-150/80-90 range. Pt will restart Lisinopril and stop Isosorbide. She will continue to monitor her symptoms and call if her dizziness and BP do not improve.

## 2010-11-21 ENCOUNTER — Telehealth: Payer: Self-pay | Admitting: Cardiology

## 2010-11-21 NOTE — Telephone Encounter (Signed)
I talked with pt. Pt states she was taking both Lisinopril and Isosorbide. She had weakness and lightheadedness.  She tried taking Isosorbide without Lisinopril and still had weakness and lightheadedness. She never tried to take Lisinopril without Isosorbide. Pt stopped both medications and her symptoms resolved. Pt is willing to try Lisinopril without Isosorbide. She will restart Lisinopril without Isosorbide. She will call back if she is unable to tolerate the Lisinopril.

## 2010-11-21 NOTE — Telephone Encounter (Signed)
Pt having problem with isosobide or linsinopril having  weakness and lightheadedness within 3 days of taking them, she has stopped them both

## 2010-11-24 NOTE — Telephone Encounter (Signed)
Per pt calling back- c/o swelling. meds was decrease. [phone].

## 2010-11-25 ENCOUNTER — Telehealth: Payer: Self-pay | Admitting: Cardiology

## 2010-11-25 DIAGNOSIS — E785 Hyperlipidemia, unspecified: Secondary | ICD-10-CM

## 2010-11-25 DIAGNOSIS — I2510 Atherosclerotic heart disease of native coronary artery without angina pectoris: Secondary | ICD-10-CM

## 2010-11-25 NOTE — Telephone Encounter (Signed)
Can increase Lasix to 40 qam and 20 qpm.  BMET in 2 weeks, can do lipids/LFTs and hgbA1c also.

## 2010-11-25 NOTE — Telephone Encounter (Signed)
Pt lasix was cut in half per Dr. Shirlee Latch and she has swelling and wants to increase it back to 2 a day pt said she called yesterday and didn't get a call back so she was calling again/lg

## 2010-11-25 NOTE — Telephone Encounter (Signed)
Pt c/o swelling in hands, feet and face.  She is wanting to increase her Lasix 40 a day to twice a day.  I advised pt that we need to check with MD before she does that as her last Crea/BUN was elevated (09/03/10).   Instructed her that MD may want to repeat BMP prior to making that much of a change in her medications.  Pt requested if Dr Shirlee Latch wants labs on her that she also be able to have a fasting lipid and HA1C at the same time.  Will forward to MD for review and orders

## 2010-11-25 NOTE — Telephone Encounter (Signed)
Returned call to pt with Dr. Alford Highland recommendations to increase lasix 40mg  qam and 20 mg qpm.  She will return for repeat labs on 12/10/10.  Pt understands.  Pt also says she has been nauseated for 4 days especially after eating.  She has thrown up on occasion but not today.According to pt  drinks a 6 pack of regular sodas daily .  Encouraged pt to decrease sodas and supplement with water. She has also recently started taking Ambien for sleep and this has helped. Mylo Red RN

## 2010-11-25 NOTE — Telephone Encounter (Signed)
[name], could you see what is going on? Don't understand from that message. Thanks.

## 2010-12-09 NOTE — H&P (Signed)
[name], [name]         ACCOUNT NO.:  [id number]   MEDICAL RECORD NO.:  [id number]          PATIENT TYPE:  INP   LOCATION:  2036                         FACILITY:  [facility]   PHYSICIAN:  Bevelyn Buckles. Bensimhon, MDDATE OF BIRTH:  1955-09-21   DATE OF ADMISSION:  03/08/2007  DATE OF DISCHARGE:                              HISTORY & PHYSICAL   PRIMARY CARDIOLOGIST:  Dr. Simona Huh   PRIMARY CARE PHYSICIAN:  Dr. Mora Bellman   HISTORY OF PRESENT ILLNESS:  This is a 55 year old Caucasian female with  known coronary artery disease with stent to the left anterior descending  artery in March of 2007 with a drug-eluting stent per Dr. Bonnee Quin.  While cleaning her kitchen today, she began to feel shaky with weakness,  lightheadedness.  While going about her kitchen chores, she felt chest  pressure and felt as if someone was sitting on her chest radiating into  her shoulder blade; she felt very lightheaded and weak.  She asked her  son to call 911.  When EMS arrived, O2 was placed and she was given an  aspirin and she began to breathe better when she was resting.  The chest  pressure has gotten better since she has been resting; it worsens with  exertion.  She also states that she felt palpitations, felt like her  heart was dropping beat.  She states that she has had chest pressure and  shortness of breath in the past, but never had as many symptoms  happening all at the same time.  The patient has recently had a viral  diarrhea and she states I must have picked up a bug, and she has been  having nausea, vomiting and diarrhea for the last three days prior to  this ER visit.  The patient also has [place] the [place],  a two-hour trip up spending the day there and coming back the next  morning.  The patient also has a prior history of normocytic anemia,  tobacco use, recently diagnosed diabetes and hypercholesterolemia.   REVIEW OF SYSTEMS:  Positive for lightheadedness,  chest pain, shortness  of breath, dyspnea on exertion, palpitations, nausea, vomiting and  diarrhea for the last two days with nausea today with no vomiting.   PAST MEDICAL HISTORY:  As stated above and:  1. Stable angina.  2. CAD status post cardiac catheterization with drug-eluting stent to      the LAD resulting in a 75% stenosis reduced to 0%.  3. Normocytic anemia.  4. Continued tobacco abuse.  5. Recently diagnosed diabetes.  6. Hypercholesterolemia.   SOCIAL:  The patient lives in [place] with her husband.  She does not  work outside the home.  She has been smoking and has a 30-pack-year  history, negative for ETOH, drug use.  She does not get any exercise.  She has been watching her diet secondary to recent diagnosis of  diabetes.  She is not on insulin or medications at this time.   FAMILY HISTORY:  Mother with hypertension, CAD, valvular disease and  history of GI bleed.  Father deceased from an MI.  She has one  sister  with CAD, diabetes and hypertension and two brothers with diabetes.   CURRENT MEDICATIONS AT HOME:  1. Plavix 75 mg daily.  2. Enteric-coated aspirin 75 mg daily.  3. Cymbalta 60 mg daily.  4. Zyprexa 20 mg daily.  5. Vytorin 10/80 once a day.  6. Multivitamin daily.  7. Protonix 40 mg daily.  8. Toprol-XL 50 mg daily.  9. HCTZ 25 mg daily.  10.Potassium 10 mEq daily.   ALLERGIES:  VANTIN, ERYTHROMYCIN AND DESERYL.   Labs are not available.   Point-of-care markers:  Myoglobin 54.6, CK-MB less than 1.0, troponin  less than 0.05.  Chemistries and CBC are pending and have been drawn.   Chest x-ray:  No acute cardiopulmonary abnormality.   EKG revealing normal sinus rhythm with ventricular rate of 96 beats per  minute without evidence of prior or new ischemic changes.   PHYSICAL EXAMINATION:  VITAL SIGNS:  Blood pressure 136/77, pulse 108,  respirations 16, temperature 97.3.  GENERAL:  This is a 55 year old, obese Caucasian female in no  acute  distress.  HEENT:  Head is normocephalic, atraumatic.  Eyes:  PERRLA.  Mucous  membranes and mouth are pink and moist.  Tongue is midline.  NECK:  Supple.  There is no thyromegaly, no JVD, no carotid bruits are  appreciated.  CARDIOVASCULAR:  Regular rate and rhythm without murmurs, rubs or  gallops.  LUNGS:  Clear to auscultation without wheezes, rales or rhonchi.  ABDOMEN:  Soft.  There is some tenderness in left lower quadrant.  Hyperactive bowel sounds without rebound or guarding.  EXTREMITIES:  Without clubbing, cyanosis or edema.  SKIN:  Very dry.  NEURO:  Cranial nerves II-XII are grossly intact.   IMPRESSION:  1. Palpitations.  2. Atypical chest pain.  3. Coronary artery disease status post LAD stent in March of 2007.  4. Diarrhea with nausea and vomiting with associated left lower      quadrant pain.  5. Continued tobacco abuse.   PLAN:  This is a 55 year old, obese Caucasian female with known history  of coronary artery disease, hypertension, hyperlipidemia and newly  diagnosed diabetes with history of continued tobacco abuse, who is  presented to the emergency room after feeling shaky, lightheaded, dizzy,  short of breath, and feeling chest pressure as if someone were sitting  on her chest radiating through to the back.   The patient has been seen and examined by myself and Dr. Arvilla Meres.  The chest pain is fairly atypical.  We will admit for 23-  hour observation, hydrate, rule out myocardial infarction and watch on  telemetry.  Will check electrolytes and monitor for any further  palpitations.  If patient has reoccurrence of chest pain, will plan CT  to rule  out pulmonary emboli.  If no further chest pain, would plan outpatient  Myoview.  Will also check stool cultures, ova and parasites and C. diff,  and recommend smoking cessation counseling.  The patient will be  observed for 24 hours and make further recommendation.      Bettey Mare.  Lyman Bishop, NP      Bevelyn Buckles. Bensimhon, MD  Electronically Signed    KML/MEDQ  D:  03/08/2007  T:  03/08/2007  Job:  [id number]   cc:   Dr. Mora Bellman

## 2010-12-09 NOTE — Assessment & Plan Note (Signed)
[facility]                            CARDIOLOGY OFFICE NOTE   NAME:[name], [name] DEDOMINICIS                MRN:          [id number]  DATE:12/07/2006                            DOB:          Dec 05, 1955    PRIMARY CARE PHYSICIAN:  Talmadge Coventry, M.D.   REASON FOR VISIT:  Followup recent edema.   HISTORY OF PRESENT ILLNESS:  Ms. Godby called to the office last  week complaining of lower extremity edema, predominantly around her  ankles.  She was placed on hydrochlorothiazide with potassium supplement  and states that her symptoms have improved since then.  She is not  experiencing any chest pain and her dyspnea on exertion is stable.  Her  electrocardiogram today shows sinus rhythm with a fusion beat and a rate  of 90 beats per minute.  Otherwise, no significant changes compared to  the prior tracing.  She reports being compliant with her medications  otherwise.  Today, we talked about general salt restriction and  continued use of hydrochlorothiazide for both an antihypertensive and a  diuretic.   ALLERGIES:  VANTIN, ERYTHROMYCIN, DESYREL.   PRESENT MEDICATIONS:  1. Enteric-coated aspirin 325 mg p.o. daily.  2. Plavix 75 mg p.o. daily.  3. Cymbalta 60 mg p.o. daily.  4. Zyprexa 20 mg p.o. daily.  5. Vytorin 10/80 mg p.o. daily.  6. Multivitamin daily.  7. Protonix 40 mg p.o. b.i.d.  8. Toprol XL 50 mg p.o. daily.  9. Hydrochlorothiazide 25 mg p.o. daily.  10.Potassium supplement 10 mEq p.o. daily.   REVIEW OF SYSTEMS:  As described in the history of present illness.  Was  noted.   PHYSICAL EXAMINATION:  Blood pressure 162/88, heart rate is 90, weight  is 212 pounds, which is up from 207 at the last visit.  The patient is in no acute distress.  NECK:  No elevated jugular venous pressure or loud bruits.  No  thyromegaly is noted.  LUNGS:  Clear without labored breathing at rest.  CARDIAC:  Regular rate and rhythm.  No loud murmur  or gallop.  EXTREMITIES:  No significant pitting edema.  Some trace edema is  evident.  SKIN:  Warm and dry.  Pulses are 2+.   IMPRESSION/RECOMMENDATIONS:  1. Recent lower extremity edema.  Relatively mild and improved with      diuretic.  I will plan to continue hydrochlorothiazide with      potassium supplement as both an antihypertensive and also a      diuretic.  She will have a followup BMET in 2 weeks.  I will      otherwise plan to see her      back in approximately 6 months for general cardiac followup.  2. Further plan is to follow.     Jonelle Sidle, MD  Electronically Signed    SGM/MedQ  DD: 12/07/2006  DT: 12/07/2006  Job #: [id number]   cc:   Talmadge Coventry, M.D.

## 2010-12-09 NOTE — Assessment & Plan Note (Signed)
[name]                            CARDIOLOGY OFFICE NOTE   NAME:[name], [name]                MRN:          [id number]  DATE:11/21/2007                            DOB:          03-28-1956    PRIMARY CARE PHYSICIAN:  [name], M.D.   REASON FOR VISIT:  Post hospital followup.   HISTORY OF PRESENT ILLNESS:  [name] was admitted to the hospital  since her last office visit back in mid March.  She presented at that  time with lower sternal and right upper quadrant discomfort.  She ruled  out for myocardial infarction and also had a normal amylase and lipase.  CT scan of the abdomen and pelvis demonstrated no acute process.  She  had probable renal cysts and no significant pelvic pathology.  Her chest  x-ray report showed no active disease suggesting a stable right lower  lobe nodule.  In reviewing her previous evaluation including a CT scan  of the chest done in August 2008, this revealed no acute parenchymal  findings with minimal streaky bilateral atelectasis.  She was referred  for a follow-up Myoview which was done here in the office on March 17.  This study revealed soft tissue attenuation with no scar or ischemia and  a normal ejection fraction of 72%.  She comes back in today stating she  has been feeling well.  She is not having any recurrent chest pain or  abdominal pain.  She has been eating normally.  Normal bowel movements.  Electrocardiogram shows sinus tachycardia at 107 beats per minute  although her heart rate decreases in the 90s after being seated for a  few minutes.  She reports compliance with the medications otherwise.  She has had no cough, fevers, chills or hemoptysis.   ALLERGIES:  VANTIN, ERYTHROMYCIN, DESYREL.   CURRENT MEDICATIONS:  1. Enteric coated aspirin 325 mg p.o. daily.  2. Plavix 75 mg p.o. daily.  3. Cymbalta 60 mg p.o. daily.  4. Vytorin 10/80 mg p.o. daily.  5. Multivitamin 1 p.o.  daily.  6. Toprol XL 50 mg p.o. daily.  7. Potassium 10 mEq p.o. daily.  8. Lasix 40 mg p.o. daily.  9. Zyprexa 30 mg p.o. daily.  10.Protonix 40 mg p.o. daily.  11.Metformin 1000 mg p.o. daily.   REVIEW OF SYSTEMS:  As described in the history of present illness  otherwise negative.   PHYSICAL EXAMINATION:  Blood pressure is 118/80, heart rate is 90 and  regular, weight is 216 pounds up from 212.  The patient is comfortable  and in no acute distress.  NECK:  No elevated jugular venous pressure.  LUNGS:  Clear without labored breathing.  CARDIAC:  Reveals a regular rate and rhythm.  No S3 gallop or  pericardial rub.  EXTREMITIES:  Show no significant pitting edema.   IMPRESSION/RECOMMENDATIONS:  1. History of coronary artery disease status post drug-eluting stent      placement in the left anterior descending, patent at      catheterization in 2007, and now status post a nonischemic Myoview      in March of this  year. We will plan to continue medical therapy and      I will see her back over the next 6 months.  2. History of hyperlipidemia, with good LDL control in the 80s      recently, on Vytorin.  3. Hypertension, well-controlled.  4. History of esophagitis, duodenitis and esophageal stricture.  This      has been relatively quiescent without any frank dysphasia.  This      has accounted for some of the patient's chest pain presentations in      the past as well.  5. Description of a stable small right lower lobe nodule based on      plain films of the chest in March.  This is not described on CT      scan of the chest done from August of last year.  The patient is      not reporting any symptoms.  Would suggest a follow-up      noncontrasted CT scan of the chest in approximately 6 months,      around the time of the next visit.     [name] Sidle, MD  Electronically Signed    SGM/MedQ  DD: 11/21/2007  DT: 11/21/2007  Job #: [id number]   cc:   [name],  M.D.

## 2010-12-09 NOTE — H&P (Signed)
[name], [name]         ACCOUNT NO.:  [id number]   MEDICAL RECORD NO.:  [id number]          PATIENT TYPE:  EMS   LOCATION:  [facility]                         FACILITY:  [facility]   PHYSICIAN:  Noralyn Pick. Eden Emms, MD, FACCDATE OF BIRTH:  1956/04/12   DATE OF ADMISSION:  10/04/2007  DATE OF DISCHARGE:                              HISTORY & PHYSICAL   ADMITTING PHYSICIAN:  Theron Arista C. Eden Emms, MD, [facility].   SUMMARY OF HISTORY:  [name] is a 55 year old white female who  presented to [facility] emergency room via private vehicle after being  referred by her primary care physician for swelling of her hands and  feet since Thursday.  The patient also has noted an anterior left-sided  chest discomfort that she described as someone sitting on her chest,  radiating to the back and her chin since the morning of October 03, 2007,  at approximately 8:00 a.m.  She did try to take some Tylenol without  change.  She did not want to try nitroglycerin secondary to residual  headache.  Her discomfort has waxed and waned since that time, between a  4 and an 8 on a scale of 0 to 10.  The last time it was a 0 was prior to  onset.  It is worse with movement of her upper extremities.  She denies  any recent accidents or injuries.  The discomfort is pleuritic.  She has  noted a slight increased shortness of breath and nausea but denies  vomiting or diaphoresis.  She feels it is somewhat similar to her stent  and 2007 cath, but she cannot describe any differences.   It is noted that she was recently hospitalized in August for similar  symptoms.  She was recommended to have an outpatient Myoview and  followup with Dr. Diona Browner; however, the patient cancelled these  appointments because she stated that she was feeling well.   PAST MEDICAL HISTORY:  ALLERGIC TO VANTIN, ERYTHROMYCIN, AND DESYREL.   Medications prior to admission included Plavix 75 mg daily, enteric-  coated aspirin 325 daily, Cymbalta 60 mg  daily, Zyprexa 20 mg daily,  Vytorin 10/80 mg daily, multivitamin daily, Protonix 40 mg daily, Toprol  XL 50 mg daily, K-Dur 20 mEq daily, Lasix 40 mg daily, nitroglycerin 0.4  p.r.n., metformin 500 mg b.i.d., remote Chantix.   Past medical history is notable for normocytic anemia and diabetes.  Sugars at home ranged from 90-130.  Last hemoglobin A1c in August 2008  was 5.9.  She also has a history of hypertension.  Does not check her  blood pressure at home.  She has a history of hyperlipidemia.  Last  check that is available was in August 2008, showed total cholesterol  119, triglycerides 168, HDL 32, LDL 53.  She has known coronary artery  disease with LAD stenting.  Last catheterization January 20, 2006,  showed  a 30% proximal LAD patent LAD stent.  EF was 60%.  Last echocardiogram  on August 23, 2006, showed EF of 65% with no wall motion abnormalities.  Last Myoview was on October 06, 2005.  It  showed an EF of  70% without  wall motion abnormalities and no signs ischemia.  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   SOCIAL HISTORY:  She resides in [place] with her husband.  She has  four children, one grandchild.  No great-grandchildren.  She has been a  housewife.  She quit smoking on August 08, 2007.  Prior to that, she  was smoking at least a pack per day for 30 years.  Denies any alcohol,  drugs or herbal medications.  Tries very hard to follow a low-  carbohydrate ADA diet.  She has begun exercising.  She states that she  tries to walk a block a day.   FAMILY HISTORY:  Mother is alive at the age of 61 with a history of  hypertension, GI bleed, coronary artery disease, valvular problems,  cerebrovascular disease.  Father died with a myocardial infarction.  She  has one sister alive with a history of hypertension, diabetes, coronary  artery disease and two brothers with  diabetes.   Review of systems is notable for supposedly a 40-pound weight gain in  the last 3 months, dentures, chronic dyspnea on exertion that she feels  might be slightly worse, edema that does not fluctuate, wheezing,  postmenopausal.  All other systems are unremarkable.   PHYSICAL EXAMINATION:  GENERAL:  Well-nourished, well-developed,  pleasant obese white female in no apparent distress.  Husband is  present.  VITAL SIGNS:  On presentation to the emergency room, blood pressure was  167/90, pulse 95, respirations 20, temperature 98.2, and 94% sat on room  air.  Blood pressure is now 152/78.  HEENT:  Unremarkable except for dentures.  NECK:  Supple without thyromegaly, adenopathy, JVD or carotid bruits.  CHEST:  Symmetrical excursion.  She has diffuse expiratory wheezes.  HEART:  PMI is not displaced.  Regular rate and rhythm.  I do not  appreciate murmurs, rubs, clicks or gallops.  All pulses are symmetrical  and intact without abdominal or femoral bruits.  SKIN/INTEGUMENT:  Intact.  ABDOMEN:  Obese.  Bowel sounds present, without organomegaly, masses or  tenderness.  EXTREMITIES:  Negative cyanosis or clubbing.  She does have trace  nonpitting edema in her upper and lower extremities.  MUSCULOSKELETAL:  Unremarkable; however, she does have reproducible  chest discomfort with palpation.  This does not change with upper  extremity full range of motion.  NEURO:  Unremarkable.   Chest x-ray:  Stable right lower lobe nodule.  No active disease.  EKG  shows normal sinus rhythm, normal axis, normal intervals, no acute  changes.  Negative change from EKG August 2008.  H&H was 14.0 and 41.0.  Normal indices.  Platelets 248, WBC 9.9.  I STAT was 142, potassium 3.8,  BUN 12, creatinine 0.92.  Point-of-care markers negative x2.   IMPRESSION:  1. Prolonged atypical chest discomfort, probably musculoskeletal in      origin.  2. Hypertension.  3. Trace peripheral edema.  May be related  to hypertension.  She does      not appear to have any active issues related to potential heart      failure.  A 40-pound weight gain which does not appear to be fluid      related.  This may be related to her recent tobacco cessation      although she states that the weight gain began prior to her tobacco      cessation.  History as noted per past medical history.   DISPOSITION:  Dr. Eden Emms reviewed the patient's history, spoke with and  examined the patient and agrees with the above.  We will admit her for  observation.  Check our usual labs in addition to D-dimer, hemoglobin  A1c, TSH, lipids.  If she rules out for myocardial infarction, we will  recommend outpatient stress Myoview.  She will need an  outpatient blood pressure diary to ensure that her hypertension is being  treated.  We will continue her home medications, however, will hold  metformin in case she does rule in for myocardial infarction and will  need a cardiac catheterization.  We will add ACE inhibitor to her  medical regimen given her hypertension and history of diabetes.      Joellyn Rued, PA-C      Noralyn Pick. Eden Emms, MD, [facility]  Electronically Signed    EW/MEDQ  D:  10/04/2007  T:  10/05/2007  Job:  [id number]   cc:   Wilhemina Bonito. Marina Goodell, MD  Talmadge Coventry, M.D.  Jonelle Sidle, MD

## 2010-12-09 NOTE — Consult Note (Signed)
NAMEJENEVIEVE, [name]         ACCOUNT NO.:  [id number]   MEDICAL RECORD NO.:  [id number]          PATIENT TYPE:  EMS   LOCATION:  MAJO                         FACILITY:  [facility]   PHYSICIAN:  Madolyn Frieze. Jens Som, MD, FACCDATE OF BIRTH:  1956/05/28   DATE OF CONSULTATION:  02/23/2008  DATE OF DISCHARGE:                                 CONSULTATION   PRIMARY CARE PHYSICIAN:  Talmadge Coventry, MD.   PRIMARY CARDIOLOGIST:  Jonelle Sidle, MD in [place].   CHIEF COMPLAINT:  Shoulder pain.   HISTORY OF PRESENT ILLNESS:  [name] is a 55 year old female with  known coronary artery disease.  Her previous anginal symptom was chest  pain.  She had chest pain several months ago and had a Myoview at that  time that was negative.  Last p.m. at approximately 9:00 p.m., she had  onset of left shoulder pain.  She stated it radiated down her left arm  and up into the left side of her neck.  She had some shortness of  breath, nausea, but no diaphoresis or vomiting.  She tried Tums but  there was no help.  She stated that the symptoms lasted all night long  at a 7/10.  This a.m. she tried sublingual nitroglycerin.  It was no  help.  She called our office and came to the emergency room as we  instructed.  In the emergency room, she has received Reglan and  hydrocodone.  These medications were given very recently and have not  had time to take effect.  She states the shoulder pain is a new symptom  for her.  Position change or movement did not change her symptoms at  all.  There is no change with deep inspiration.  Of note, she has been  able to increase her activity recently with walking and has not had any  chest pain or other symptoms.  The left shoulder pain started while she  was driving.  Currently although she is still complaining of 7/10 pain,  she does not appear to be in acute distress.   PAST MEDICAL HISTORY:  1. 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  2. Status post cardiac catheterization in March 2007 showing an LAD      70% treated with a Cypher stent reducing the stenosis to 0,      circumflex 30%, RCA 30%.  3. Relook cath on June 2007 for chest pain, no in-stent restenosis and      other lesions unchanged.  4. Preserved left ventricular function by cath in the 2007,      echocardiogram in 2008, and Myoview in 2009.  5. Status post Myoview in March 2009 showing soft tissue attenuation      but no scar or ischemia and an EF of 72%.  6. Hyperlipidemia.  7. Hypertension.  8. Diabetes.  9. Tobacco use, quit recently.  10.History of esophagitis, duodenitis, and esophageal stricture.  11.History of bipolar disorder and opiate abuse, status post detox in      January 2007.  12.History of a  right lower lobe nodule, stable by chest x-ray in      March 2009.  13.Gastroesophageal reflux disease.  14.History of 2:1 heart block when on beta-blockers plus clonidine.  15.Obesity.   SURGICAL HISTORY:  She is status post cardiac catheterizations as well  as hysterectomy and removal of the left renal mass, which was benign.  She has also had EGD and dilatation.   ALLERGIES:  She is allergic or intolerant to DESYREL, MYCINS, and  VANTIN.   CURRENT MEDICATIONS:  1. Aspirin 325 mg a day.  2. Plavix 75 mg a day.  3. Cymbalta 60 mg a day.  4. Vytorin 10/20 daily.  5. Toprol-XL 50 mg a day.  6. Zyprexa 30 mg nightly.  7. Protonix 40 mg a day.  8. Lasix 40 mg p.o. b.i.d.  9. Potassium 10 mEq 2 tablets daily.  10.Metformin 500 mg 2 tablets q.p.m.  11.Albuterol MDI p.r.n.   SOCIAL HISTORY:  She lives in [place] with her husband.  She is a  housewife.  She has a greater than 35 pack year history of tobacco use,  but quit in January 2009.  She denies alcohol abuse and states that she  is no longer abusing pain control medications.   FAMILY HISTORY:  Her mother is alive in her late 62s with no history  of  coronary artery disease.  Her father died at age 32 of an MI in and one  of three siblings has heart disease.   REVIEW OF SYSTEMS:  She has had no fevers, chills, or sweats.  She has  chronic dyspnea on exertion, but this has been better recently because  she has been able to increase her activity and is walking more.  She  occasionally wheezes but denies cough.  She had some lower extremity  edema that improved with an increased Lasix dose.  She has occasional  arthralgias.  Her reflux symptoms are well-controlled on the Protonix  and she denies hematemesis, hemoptysis, or melena.  Full 14-point review  of systems is otherwise negative.   PHYSICAL EXAMINATION:  VITAL SIGNS:  Temperature is 98.8, blood pressure  148/87, pulse 80, respiratory rate 16, and O2 saturation 98% on room  air.  GENERAL:  She is a well-developed obese white female in no acute  distress.  HEENT:  Normal.  NECK:  There is no lymphadenopathy, thyromegaly, bruit, or JVD noted.  CVA:  Heart is regular in rate and rhythm with an S1 and S2 and no  significant murmur, rub, or gallop is noted.  Distal pulses are intact  in all four extremities and no femoral bruits are appreciated.  LUNGS:  Clear to auscultation bilaterally.  SKIN:  No rashes or lesions are noted.  ABDOMEN:  Soft and nontender with active bowel sounds.  No  hepatosplenomegaly is noted.  EXTREMITIES:  There is no cyanosis, clubbing, or edema noted.  MUSCULOSKELETAL:  There is no joint deformity or effusions and no spine  or CVA tenderness.  NEURO:  She is alert and oriented.  Cranial nerves II-XII grossly  intact.   Chest x-ray is pending.   EKG is sinus rhythm rate 79 with no acute changes.   LABORATORY VALUES:  Hemoglobin 14.9, hematocrit 44.8, WBC 8.0, platelets  224, INR 0.9, PTT 33.  Sodium 144, potassium 3.6, chloride 101, CO2 of  30, BUN 13, creatinine 0.95, glucose 98, GFR greater than 60, CK-MB  57/0.9, troponin-I 0.01.    IMPRESSION:  [name] was seen today by  Dr. Jens Som.  She is a 2-  year-old female with a past medical history of coronary artery disease  as well as multiple cardiac risk factors who presents with left shoulder  pain.  In June 2007, she had no obstructive coronary artery disease and  a Myoview just a few months ago showed no ischemia.  She describes her  left shoulder pain as an aching and states it has been continuous since  last night.  It is not pleuritic and not related to food.  She has had  some nausea.  Her EKG does not have any acute changes.  Her symptoms  have been present for greater than 12 hours continuously with negative  cardiac enzymes and no EKG changes.  Essentially, she has ruled out.  If  her chest x-ray is also without any acute abnormality, she can be  discharged on preadmission medications and follow up with Dr. Nona Dell.  She will be treated with pain control medications and asked  to follow up with her family physician as well.      Theodore Demark, PA-C   Electronically Signed     ______________________________  Madolyn Frieze. Jens Som, MD, [facility]    RB/MEDQ  D:  02/23/2008  T:  02/24/2008  Job:  [id number]   cc:   Talmadge Coventry, M.D.

## 2010-12-09 NOTE — Discharge Summary (Signed)
NAMEMAKENZE, ELLETT         ACCOUNT NO.:  [id number]   MEDICAL RECORD NO.:  [id number]          PATIENT TYPE:  INP   LOCATION:  3702                         FACILITY:  [facility]   PHYSICIAN:  Jonelle Sidle, MD DATE OF BIRTH:  07/29/1955   DATE OF ADMISSION:  10/04/2007  DATE OF DISCHARGE:  10/05/2007                               DISCHARGE SUMMARY   PROCEDURE:  CT of the abdomen and pelvis.   PRIMARY FINAL DISCHARGE DIAGNOSIS:  Lower sternal and right upper  abdominal pain, amylase and lipase within normal limits, and CT negative  for acute process.   SECONDARY DIAGNOSES:  1. Chest pain, cardiac enzymes negative for myocardial infarction and      outpatient Myoview scheduled.  2. Normocytic anemia with a hemoglobin of 14, hematocrit of 41 within      normal limits this admission.  3. Status post drug-eluting stent to the LAD in March 2007.  4. Ongoing tobacco use.  5. Hyperlipidemia.  6. Diabetes.  7. History of gastroesophageal reflux disease and dysphagia with      esophageal stricture and dilatation x3.  8. Chronic obstructive pulmonary disease.  9. History of bipolar disorder and a history of opiate abuse, detox      January 2007.   TIME OF DISCHARGE:  49 minutes.   HOSPITAL COURSE:  Ms. Piedra is a 55 year old female with a history  of coronary artery disease and prior coronary intervention.  She had  left-sided chest pain and to some degree epigastric pain that she  treated with Tylenol because nitroglycerin gives her a headache.  She  came to the emergency room and was admitted for further evaluation and  treatment.   The patient also complained of pain in her right upper quadrant and  subxiphoid area.  Amylase and lipase were within normal limits, and a CT  of the abdomen and pelvis were without significant abnormality with  renal cyst stable from previous testing.  She had other labs checked  including a D-dimer which was within normal limits, a  hemoglobin A1c  which was 6.1, a TSH within normal limits at 1.941, and a BNP within  normal limits at 76.  CK-MB and troponin I were negative for MI.   On October 05, 2007, Ms. Schriever was getting some pain control with  Darvocet, and although it made her slightly nauseated, she felt like she  could tolerate this as long as she ate.  It was felt that since her labs  were within normal limits and her CT was without acute process, she  could be safely discharged home with an outpatient Myoview and follow-up  arranged.   LABORATORY VALUES:  Total cholesterol 149, triglycerides 190, HDL 27,  LDL 84, amylase 50, and lipase 37.   DISCHARGE INSTRUCTIONS:  Her activity level is to be as tolerated.  She  is to stick to a low-sodium diabetic diet.  She is to follow up with a  stress test on March 17 at 8:30 and is not to eat or drink anything  after midnight before; although, she can take her meds with the  exception of Toprol  the day of the stress test.  She is to take a half  dose of her metformin the evening before the stress test.  She is to  follow up with Dr. Diona Browner on March 27 at 11 a.m. and with Dr.  Smith Mince as needed.   DISCHARGE MEDICATIONS:  1. Plavix 75 mg daily.  2. Aspirin 325 mg daily.  3. Cymbalta 60 mg daily.  4. Zyprexa 20 mg daily.  5. Vitamins as prior to admission.  6. Toprol XL 50 mg a day.  7. Lasix 40 mg a day.  8. Potassium 20 mEq a day.  9. Vytorin 10/80 daily.  10.Protonix 40 mg a day.  11.Metformin 500 mg 2 tablets q.p.m.  12.Tylenol or Darvocet p.r.n.Theodore Demark, PA-C      Jonelle Sidle, MD  Electronically Signed    RB/MEDQ  D:  10/05/2007  T:  10/06/2007  Job:  [id number]   cc:   Talmadge Coventry, M.D.

## 2010-12-09 NOTE — Discharge Summary (Signed)
[name], [name]         ACCOUNT NO.:  [id number]   MEDICAL RECORD NO.:  [id number]          PATIENT TYPE:  OBV   LOCATION:  2036                         FACILITY:  [facility]   PHYSICIAN:  [name] Beals. Juanda Chance, MD, FACCDATE OF BIRTH:  30-Dec-1955   DATE OF ADMISSION:  03/08/2007  DATE OF DISCHARGE:  03/09/2007                               DISCHARGE SUMMARY   PRIMARY CARDIOLOGIST:  Dr. Simona [name].   PRIMARY CARE PHYSICIAN:  Dr. Smith [name].   DISCHARGE DIAGNOSIS:  Chest pain.   SECONDARY DIAGNOSES:  1. Coronary artery disease, status post drug-eluting stent placement      to the left anterior descending in March of 2007.  2. Ongoing tobacco abuse.  3. Hyperlipidemia.  4. Newly diagnosed type 2 diabetes mellitus.  5. A history of normocytic anemia.  6. Obesity.   ALLERGIES:  1. VANTIN.  2. ERYTHROMYCIN.  3. DESYREL.   PROCEDURE:  None.   HISTORY OF PRESENT ILLNESS:  A 55 year old Caucasian female with a prior  history of CAD and stenting of the LAD in March of 2007, who was in her  usual state of health until March 08, 2007 when she had sudden onset of  left-sided chest discomfort, which was fairly focal in nature; however,  with radiation between her shoulder blades and associated with  lightheadedness and weakness.  EMS was activated and patient was given  aspirin with improvement in symptoms.  She was taken to the [facility]  ED for further evaluation.  An ECG was without any acute changes and  first set of cardiac markers were negative.  She was admitted for  further evaluation.   HOSPITAL COURSE:  Ms. [name] continued to have some low level, focal  left-sided chest discomfort, which was worse with palpation, as well as  some abdominal discomfort.  A CT of the abdomen and pelvis was  performed, which showed no evidence of pulmonary embolism or thoracic  and/or abdominal aortic aneurysm or dissection.  There were no acute  abdominal findings with the exception  of bilateral, stable renal cysts.  This morning, she appears comfortable, although continues to complain of  reproducible left chest discomfort.  Her ECG is normal and cardiac  markers remain negative.  She will be discharged home today with plans  for an outpatient exercise Myoview on August 20 at 8:30 a.m.   DISCHARGE LABS:  Hemoglobin 12.4, hematocrit 36.0, WBC 10.1, platelets  219.  Sodium 140, potassium 4.1, chloride 106, CO2 30, BUN 15,  creatinine 0.93, glucose 98.  Cardiac markers negative x3.  LFTs are  within normal limits.  Total cholesterol 119, triglycerides 168, HDL 32,  LDL 53, hemoglobin A1c 5.9.  Urinalysis was negative.   DISPOSITION:  The patient is being discharged home today in good  condition.   FOLLOWUP PLANS AND APPOINTMENTS:  We have arranged for an exercise  Myoview on August 20 at 8:30 a.m.  She will follow up with Dr. Diona [name]  following this on September 9 at 3:30 p.m.  She is asked to follow up  with primary care [name], Dr. Smith [name], in 1-2 weeks.   DISCHARGE MEDICATIONS:  1. Plavix 75 mg daily.  2. Aspirin 325 mg daily.  3. Cymbalta 60 mg daily.  4. Zyprexa 20 mg daily.  5. Vytorin 10/80 mg q.p.m.  6. Multivitamin 1 daily.  7. Protonix 40 mg daily.  8. Toprol-XL 50 mg daily.  9. HCTZ 25 mg daily.  10.Potassium 10 mEq daily.  11.Glucophage 500 mg b.i.d.  12.Nitroglycerin 0.4 mg sublingual p.r.n. chest pain.   OUTSTANDING LAB STUDIES:  None.   DURATION OF DISCHARGE ENCOUNTER:  Forty-five minutes, including  physician time.      [name], ANP      [name] R. Juanda Chance, MD, [facility]  Electronically Signed    CB/MEDQ  D:  03/09/2007  T:  03/09/2007  Job:  [id number]   cc:   [name], M.D.

## 2010-12-09 NOTE — H&P (Signed)
[name], [name]         ACCOUNT NO.:  [id number]   MEDICAL RECORD NO.:  [id number]          PATIENT TYPE:  OBV   LOCATION:  3028                         FACILITY:  [facility]   PHYSICIAN:  Lonia Blood, M.D.      DATE OF BIRTH:  [birth year]   DATE OF ADMISSION:  09/26/2008  DATE OF DISCHARGE:                              HISTORY & PHYSICAL   CHIEF COMPLAINT:  Cough/shortness of breath.   HISTORY OF PRESENT ILLNESS:  The patient is a 55 year old woman with  past medical history significant for CAD, status post stents, DM,  hyperlipidemia, tobacco abuse, COPD, not on home oxygen, and bipolar  disorder presented to the ED after being seen by PCP for a 2-day history  of increased shortness of breath and cough with green sputum.  The  patient also complained of fevers, chills, and having been around her  grandchildren who are also sick.  The patient also complains of headache  and loose stools.  She denies chest pain, dizziness, hemoptysis,  abdominal pain, nausea, vomiting, constipation, hematuria, frequency,  dysuria, melena, hematochezia, and lower extremity edema or pain.   PAST MEDICAL HISTORY:  1. CAD, status post MI status post drug-eluding stent to the LAD with      a patent cath in 2007.  2. Normocytic anemia.  3. Tobacco abuse.  4. Hyperlipidemia.  5. Diabetes.  6. History of GERD with dysphagia with esophageal stricture dilatation      x3.  7. COPD, not on home O2.  8. History of bipolar disorder.  9. History of opiate abuse as well as benzodiazepine abuse.  10.History of suicidal ideation.  11.Hypertension.  12.Obesity.  13.PTSD.  14.Major depressive disorder.  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  16.History of right lower lobe pneumonia.   PAST SURGICAL HISTORY:  1. The patient is status post TAH-BSO.  2. C-section, 1983.  3. The patient had a bladder tack in 1987.  4. Partial nephrectomy of the left kidneys in 1997, secondary to      benign cyst.  5.  Tonsillectomy.  6. Adenoidectomy.   ALLERGIES:  Include ERYTHROMYCIN, which causes vomiting, DESYREL, which  causes anaphylaxis, VANTIN with unknown reaction.   MEDICATIONS:  1. Cymbalta 60 mg p.o. daily.  2. Plavix 75 mg p.o. daily.  3. Protonix 40 mg p.o. daily.  4. Toprol-XL 50 mg p.o. daily.  5. Lasix 40 mg p.o. daily.  6. Potassium chloride 10 mEq p.o. t.i.d.  7. Zyprexa 30 mg p.o. at bedtime.  8. Metformin 500 mg p.o. b.i.d.  9. Lipitor 30 mg p.o. daily.   SOCIAL HISTORY:  Tobacco, positive 40 years 1 pack per day.  The patient  denies any alcohol or illicit drugs.  She is married and  lives in  [place].  She also has Acupuncturist for insurance.   FAMILY HISTORY:  Mom has bilateral carotid artery stenosis, status post  endarterectomies, diabetes mellitus.  Dad passed away at the age of 60  with MI.  She has 11 siblings and is unsure of their medical history.  She has 4 children including 3 sons and 1 daughter who are healthy.  PHYSICAL EXAMINATION:  VITAL SIGNS:  Temperature equals 100.3, pulse  equals 131, respiratory rate equals 23, O2 sat equals 94% on 3 liters,  and blood pressure equals 138/88.  GENERAL:  NAD, talking in full sentences.  HEENT:  AT, [place], MMM, no erythema.  CV:  S1, S2, regular rate and rhythm.  No murmurs, rubs, or gallops.  LUNGS:  Mild bilateral diffuse wheezing, right lower lobe, positive  rhonchi, talking in full sentences, no accessory muscle usage, inferior  air movement.  ABDOMEN:  Soft, nontender, positive bowel sounds, no guarding, or no  rebound.  NEURO:  Alert and oriented x3, nonfocal.  EXTREMITIES:  No edema, no cyanosis.  GU:  No CVA tenderness.  PSYCH:  Appropriate.   LABORATORY DATA:  Blood gas; pH equals 7.446, pCO2 equals 42.4, pO2  equals 62.0, bicarb equals 29.2, and O2 sats equals 92%.  WBC 7.0,  hemoglobin 15.7, platelets 143, MCV equals 97.8, RDW equals 14.4, and  ANC equals 5.1.  Chest x-ray early/developing right lower  lobe  infiltrate.   ASSESSMENT AND PLAN:  1. Pneumonia.  We will check sputum Gram stain and C&S, urine for      Legionella strep pneumo antigen, chest x-ray in a.m., and consider      Tamiflu.  We will start the patient on azithromycin, Rocephin,      albuterol and Atrovent nebs, Solu-Medrol, O2 via nasal cannula, and      guaifenesin as needed for cough.  We also give Tylenol p.r.n. fever      and pain.  We will also have RT check pre and post bronchodilator      peak flows.  We will get a smoking cessation consult and start her      on nicotine patch.  2. Chronic obstructive pulmonary disease, see above.  3. Coronary artery disease.  The patient denies any chest pain.  We      will continue home medications, check FOB, TSH, and hemoglobin A1c.      We will also check cardiac enzymes x3 given the patient is diabetic      and her shortness of breath may be an anginal equivalent, although      unlikely.  4. Diabetes mellitus.  We will check hemoglobin A1c, start metformin,      and put the patient on sliding-scale insulin, NovoLog, with a      sensitive scale.  5. Hyperlipidemia.  We will check a fasting lipid panel and continue      her Lipitor at this time.  6. Gastroesophageal reflux disease.  We will continue her Protonix.  7. Anemia.  We will follow CBC given normal hemoglobin level at this      time.  8. Bipolar disorder/posttraumatic stress disorder/depression.  We will      continue her Zyprexa and Cymbalta.  9. Tobacco abuse.  We will give her nicotine patch and a smoking      cessation consult.  10.Hypertension.  We will continue her home medications unless her      blood pressure drops.  11.Prophylaxis,  Lovenox/Protonix.  12.Mild thrombocytopenia.  Question if this secondary to her Zyprexa.      We will monitor for now since this is not that low and no signs of      bleeding.      Rufina Falco, M.D.  Electronically Signed      Lonia Blood, M.D.   Electronically Signed    JY/MEDQ  D:  09/26/2008  T:  09/27/2008  Job:  [id number]

## 2010-12-10 ENCOUNTER — Other Ambulatory Visit: Payer: PRIVATE HEALTH INSURANCE | Admitting: *Deleted

## 2010-12-12 NOTE — Discharge Summary (Signed)
[name], [name]         ACCOUNT NO.:  [id number]   MEDICAL RECORD NO.:  [id number]          PATIENT TYPE:  IPS   LOCATION:  0305                          FACILITY:  [facility]   PHYSICIAN:  Geoffery Lyons, M.D.      DATE OF BIRTH:  1955-08-18   DATE OF ADMISSION:  08/10/2005  DATE OF DISCHARGE:  08/14/2005                                 DISCHARGE SUMMARY   CHIEF COMPLAINT AND PRESENT ILLNESS:  This was the second admission to [facility] for this 55 year old married white female voluntarily  admitted.  History of depression, suicidal ideation.  Has been abusing  OxyContin for the past couple of months.  Reports depression has also been  increasing over the past few months with plans to overdose.  Has been taking  20 mg to 40 mg daily of OxyContin for the last three weeks.  Long history of  opiate abuse.  Relapsing last December.   PAST PSYCHIATRIC HISTORY:  Second time at [facility].  Was admitted  in August of 2006.  History of bipolar disorder.  Seeing Dr. Madaline Guthrie on an  outpatient basis.   ALCOHOL/DRUG HISTORY:  As already stated, persistent use of the opioids and  also admits ongoing use of benzodiazepines.   MEDICAL HISTORY:  Gastroesophageal reflux, chronic obstructive pulmonary  disease, myocardial infarction in 2004.   MEDICATIONS:  Had been on Plavix 75 mg per day, Protonix 40 mg per day,  Zyprexa 5 mg at night, Librium 25 mg three times a day, Cymbalta 30 mg and  Vytorin and __________.   PHYSICAL EXAMINATION:  Performed and failed to show any acute findings.   LABORATORY DATA:  CBC with white blood cells 7.2, hemoglobin 13.4.  Blood  chemistry with glucose 89.  Liver enzymes with SGOT 17, SGPT 16, TSH 0.925.   MENTAL STATUS EXAM:  Alert, cooperative female with good eye contact.  Dressed in [place].  Speech is clear, normal in pace and tone.  Feeling  tired and depressed.  Appears sad and tired.  Thought processes are coherent  with no  evidence of psychosis.  No active suicidal or homicidal ideation.  No hallucinations.  Cognition was well-preserved.   ADMISSION DIAGNOSES:  AXIS I:  Bipolar disorder, depressed.  Opiate  dependence.  Benzodiazepine dependence.  AXIS II:  No diagnosis.  AXIS III:  Gastroesophageal reflux, status post myocardial infarction.  AXIS IV:  Moderate.  AXIS V:  GAF upon admission 35; highest GAF in the last year 60-65.   HOSPITAL COURSE:  She was admitted.  She was started in individual and group  psychotherapy.  She was detoxified with clonidine.  We went off the Librium.  We maintained the albuterol inhaler 2 puffs every six hours as needed,  Plavix 75 mg per day, Zyprexa Zydis 10 mg at night, Cymbalta 30 mg per day,  Vytorin.  Zyprexa was increased to 15 mg and, later on, it was increased to  20 mg at night.  She did admit to increased signs and symptoms of depression  during the holidays, brought memories back of the trauma she went through.  A  friend called and offered opioids.  She was in full relapse.  Has been  abusing benzodiazepines all along.  Wanted to be detoxed.  Endorsed suicidal  ruminations but could contract for safety.  A sense of hopelessness and  helplessness.  Was having nausea, diarrhea, aches, muscle cramps, sweats.  We pursued the detox.  Sleep was an issue, trying to get herself back  together.  She did better on Prozac and she was willing to go back on this  medication.  So we went ahead and discontinued Cymbalta and started Prozac  10 mg, that was increased to 20 mg.  By January 19th, she was in full  contact with reality.  There were no suicidal ideation, no homicidal  ideation, no hallucinations, no delusions.  She was fully detoxed.  Endorsed  she was feeling better.  Her mood improved.  Her affect was brighter.  Willing and motivated to pursue further outpatient through the [facility].   DISCHARGE DIAGNOSES:  AXIS I:  Bipolar disorder, depressed.  Opiate   dependence.  Benzodiazepine dependence.  AXIS II:  No diagnosis.  AXIS III:  Gastroesophageal reflux disease, status post myocardial  infarction in 2004.  AXIS IV:  Moderate.  AXIS V:  GAF upon discharge 55.   DISCHARGE MEDICATIONS:  1.  Plavix 75 mg per day.  2.  Protonix 40 mg per day.  3.  Catapres taper 0.1 mg at night on August 14, 2005, then 1 in the      morning for two days and then discontinue.  4.  Zyprexa 20 mg at night.  5.  Prozac 20 mg per day.  6.  Ambien 10 mg at night for sleep.   FOLLOW UP:  Dr. Sandy Salaam and Lorenda Cahill.      Geoffery Lyons, M.D.  Electronically Signed     IL/MEDQ  D:  08/24/2005  T:  08/25/2005  Job:  [id number]

## 2010-12-12 NOTE — Assessment & Plan Note (Signed)
[facility]                            CARDIOLOGY OFFICE NOTE   NAME:[name], [name] WIEGAND                MRN:          [id number]  DATE:09/02/2006                            DOB:          Jan 31, 1956    PRIMARY CARE PHYSICIAN:  Talmadge Coventry, M.D.   REASON FOR VISIT:  Cardiac testing.   HISTORY OF PRESENT ILLNESS:  [name] returns today to review  cardiac studies.  She states that since her last visit she has felt  better and without any progressive breathlessness or significant chest  pain.  Overall her studies were reassuring.  She had a follow-up  echocardiogram demonstrating continued normal left ventricular systolic  function of 65% with no major valvular abnormalities.  Her adenosine  Myoview was also normal without evidence of ischemia.  An abdominal  ultrasound was also obtained and although described as technically  challenging revealed normal-caliber abdominal aorta with mild  atherosclerosis and normal velocities in the iliac arteries.  I did  note, however, that her LDL was significantly elevated at 236 and we  discussed this today.  She admits that she has not been taking her  Vytorin regularly.  Her liver function tests were otherwise normal.  Her  total cholesterol was 334.  Today I spoke with her about the importance  of continuing medical therapy for her lipid status.  We also talked  about beginning a regular exercise regimen.   ALLERGIES:  VANTIN, ERYTHROMYCIN, DESYREL.   PRESENT MEDICATIONS:  1. Enteric-coated aspirin 325 mg p.o. daily.  2. Plavix 75 mg p.o. daily.  3. Cymbalta 60 mg p.o. daily.  4. Zyprexa 20 mg p.o. q.h.s.  5. Multivitamin one p.o. daily.  6. Protonix 40 mg p.o. b.i.d.  7. Toprol XL 50 mg p.o. daily.  8. Sublingual nitroglycerin 0.4 mg p.r.n.   REVIEW OF SYSTEMS:  As described in the history of present illness.   PHYSICAL EXAMINATION:  VITAL SIGNS:  Blood pressure today is 161/98,  heart  rate is 92, weight is 207 pounds.  GENERAL:  The patient is comfortable and in no acute distress.  NECK:  No elevated jugular venous pressure, without bruits.  LUNGS:  Clear without labored breathing.  CARDIAC:  A regular rate and rhythm without loud murmur or S3 gallop.  ABDOMEN:  Soft, nontender, no obvious ascites on examination.  EXTREMITIES:  No significant pitting edema.   IMPRESSION AND RECOMMENDATIONS:  1. History of coronary artery disease, status post drug-eluting stent      placement to the left anterior descending.  This was documented as      widely patent at catheterization in July 2007 and more recently the      patient had a nonischemic Myoview.  Plan to continue medical      therapy.  I spoke with her about risk factor modification including      diet and exercise and have recommended that she resume her Vytorin      at 10/80 mg daily.  We will then plan to see her back over the next      6 months for symptom review and also  a follow-up lipid profile with      liver function tests.  2. Significant hyperlipidemia as discussed above.  3. Hypertension, followed by Dr. Smith Mince.  If her blood pressure      continues to trend in this range, she will most likely need      additional medication adjustments/additions.  An ACE inhibitor or      angiotensin receptor blocker could be considered if Toprol alone is      inadequate.  Hopefully, exercise will also make an impact on this.  4. Known history of esophagitis, duodenitis, and esophageal stricture,      also an etiology of chest pain for [name] in the past.  She      denies any dysphagia at this time.     Jonelle Sidle, MD  Electronically Signed    SGM/MedQ  DD: 09/02/2006  DT: 09/02/2006  Job #: [id number]   cc:   Talmadge Coventry, M.D.

## 2010-12-12 NOTE — Assessment & Plan Note (Signed)
[facility]                           GASTROENTEROLOGY OFFICE NOTE   NAME:[name], [name]                MRN:          [id number]  DATE:02/23/2006                            DOB:          1956/07/06    REASON FOR CONSULTATION:  Hematemesis, reflux disease, chest pain, and  dysphagia.   HISTORY:  This is a 55 year old white female with multiple medical problems  including coronary artery disease status post coronary artery stenting of  the left anterior descending artery with a drug-eluting stent in March 2007,  chronic obstructive pulmonary disease with ongoing tobacco abuse,  hypertension, hyperlipidemia, and multiple psychiatric diagnoses.  She was  evaluated November 23, 2005, for reflux symptoms and dysphagia.  See that  dictation for details.  She subsequently underwent upper endoscopy, Nov 26, 2005.  She was found to have a benign distal stricture of the esophagus as  well as a sliding hiatal hernia.  Balloon dilation to 18-mm was carried out.  Thereafter the patient was continued on Protonix 40 mg b.i.d.  Post  dilation, her dysphagia resolved.  However, she has had recurrent dysphagia  in recent weeks.  She has also noticed recurrent chest pain which she thinks  is related to her reflux disease, also problems with heartburn despite  b.i.d. Protonix.  She does notice with her chest discomfort that she if she  drinks liquids such as water symptoms will transiently improve.  She states  that she is adhering to reflux precautions including elevation of the head  of the bed, though she continues to smoke and has not had meaningful weight  loss.  She reports developing some problems with some left upper quadrant  pain last week, with this several episodes of vomiting.  On one occasion,  streaks of blood in her vomitus, for this she was seen in the emergency room  and underwent evaluation.  No acute abnormalities found.  Her hemoglobin was  normal at 14.9.  Chemistries including liver function studies, amylase, and  lipase were normal.  Urinalysis was negative though she does have a history  of kidney stones.  A CT scan revealed mild gastric distention as well as  bilateral renal cysts.  She was asked to follow up with myself as well as  her urologist.  The pain has resolved and she has had no further vomiting.   MEDICATIONS:  1.  Aspirin 325 mg daily.  2.  Plavix 75 mg daily.  3.  Protonix 40 mg daily.  4.  Cymbalta 60 mg daily.  5.  Zyprexa 20 mg at night.  6.  Vytorin 10/80 mg daily.  7.  Toprol 25 mg daily.  8.  Multivitamin.  9.  Protonix 40 mg b.i.d.   PHYSICAL EXAMINATION:  GENERAL:  Finds a well-appearing female in no acute  distress.  VITAL SIGNS:  Blood pressure is 128/96, initial heart rate 104 and regular,  repeat heart rate 96 and regular.  Her weight is 178.8 pounds.  HEENT:  Sclerae are anicteric.  Conjunctivae are pink.  Oral mucosa intact.  No adenopathy.  LUNGS:  Clear.  HEART:  Regular.  ABDOMEN:  Soft without tenderness, mass, or hernia.  Good bowel sounds  heard.   IMPRESSION:  1.  Gastroesophageal reflux disease.  The patient is currently experiencing      breakthrough symptoms.  Also a history of peptic stricture with      recurrent dysphagia.  2.  Recent episode of minor hematemesis, likely due to retching gastropathy,      resolved.  3.  Coronary artery disease, status post coronary artery stenting of the      left anterior descending artery with a drug-eluting stent in March 2007.  4.  Chronic obstructive pulmonary disease with ongoing tobacco abuse.  5.  Hypertension.  6.  Hyperlipidemia.  7.  Multiple psychiatric diagnoses.   RECOMMENDATIONS:  1.  Change from Protonix to Nexium 40 mg b.i.d.  2.  Continue strict adherence to reflux precautions including weight loss.  3.  Antacids p.r.n. breakthrough reflux symptoms.  4.  Schedule upper endoscopy with esophageal dilation.  5.  The  patient will remain on Plavix, given her recent drug-eluting stent      placement.  6.  Dilation under direct vision with balloon will again be used.  The      nature of the procedure as well as the risks, benefits, and alternatives      were reviewed.  She understood and agreed to proceed.  7.  Ongoing general medical care with Dr. Smith Mince and cardiac care with      Dr. Diona Browner.                                   Wilhemina Bonito. Eda Keys., MD   JNP/MedQ  DD:  02/23/2006  DT:  02/23/2006  Job #:  [id number]   cc:   Talmadge Coventry, MD  Jonelle Sidle, MD

## 2010-12-12 NOTE — Discharge Summary (Signed)
[name], [name]         ACCOUNT NO.:  [id number]   MEDICAL RECORD NO.:  [id number]          PATIENT TYPE:  INP   LOCATION:  4741                         FACILITY:  [facility]   PHYSICIAN:  Carole Binning, M.D. LHCDATE OF BIRTH:  May 08, 1956   DATE OF ADMISSION:  12/14/2004  DATE OF DISCHARGE:  12/15/2004                                 DISCHARGE SUMMARY   PRINCIPAL DIAGNOSIS:  Unstable angina/coronary artery disease.   OTHER DIAGNOSES:  1.  History of syncope.  2.  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  3.  Depression.  4.  Multiple personality disorder.  5.  Post traumatic stress disorder.  6.  Asthma.  7.  Chronic obstructive pulmonary disease.  8.  Gastroesophageal reflux disease.  9.  Hyperlipidemia.  10. Status post total abdominal hysterectomy and bilateral salpingo-      oophorectomy.  11. History of tonsillectomy and adenoidectomy greater than 10 years ago.  12. History of partial nephrectomy for benign cyst.   ALLERGIES:  Desyrel causes anaphylaxis.   PROCEDURE:  Left heart cardiac catheterization.   HISTORY OF PRESENT ILLNESS:  55 year old white female with known history of  nonobstructive coronary artery disease by cath in November 2004 presented to  [facility] on May 21 after an episode of chest pain that occurred  while watching TV earlier in the day.  The discomfort radiated to her neck  and jaw and was associated with mild shortness of breath and persisted  through the evening increasing to 8 out of 10.  In the [facility] ED, she  was given nitroglycerin with reduction of the pain to 2 out of 10.  She  ruled out for MI and was admitted for further evaluation.   HOSPITAL COURSE:  The patient was taken to the cardiac catheterization lab  on May 22 for diagnostic catheterization revealing a normal left main, 50%  lesion in the mid LAD, a normal D1, a 30% stenosis in the mid circumflex, a  20% proximal mid stenosis in the RCA.   The EF was estimated at 60% without  mitral regurgitation.  It was determined based on her normal coronary  anatomy that she should continue to be medically managed and is being  discharged home tonight in satisfactory condition.   DISCHARGE LABORATORY DATA:  Hemoglobin 13.2, hematocrit 38.7, WBC 11.8,  platelets 224.  Sodium 133, potassium 3.6, chloride 106, CO2 28, BUN 14,  creatinine 0.8, glucose 85.  PT 12.6, INR 1, PTT 48.1.  CK 42, CK MB 0.5,  troponin I less than 0.01.  Calcium 9.2.  BNP 45.  TSH 2.705.   DISCHARGE PHYSICAL EXAMINATION:  Blood pressure 110/76, heart rate 66,  respirations 20, temperature 97.2, pulse ox 95% on room air.  Pleasant white  female in no acute distress, awake, alert, oriented x 3.  Neck normal  carotid upstrokes, no bruits or JVD.  Lungs:  Clear to auscultation.  Heart:  Normal S1 and S2.  No S3.  Positive S4.  No murmurs.  Abdomen:  Round, soft,  nontender, bowel sounds x 4.  Extremities:  Warm,  dry, pink.  No cyanosis,  clubbing, and edema.  Dorsalis pedes and posterior tibial pulses are 2+  bilaterally.  The right groin size which was used for cath showed no bruise  or hematoma.   DISPOSITION:  The patient is being discharged home in good condition to  follow up with her primary care physician, Dr. Smith Mince, in one week.  She  will have follow up at [facility] with Dr. Gerri Spore in two weeks.   DISCHARGE MEDICATIONS:  Aspirin 81 mg daily, Plavix 75 mg daily, Vytorin  10/80 mg q.h.s., Protonix 40 mg daily, Xanax 2 mg in the morning, 2 mg at  lunch, 4 mg at bedtime, Zyprexa 25 mg q.h.s., Cymbalta 90 mg daily,  Torsemide 20 mg b.i.d., nitroglycerin 0.4 mg sublingual p.r.n. chest pain.   Duration of discharge encounter 45 minutes.      CRB/MEDQ  D:  12/15/2004  T:  12/15/2004  Job:  [id number]

## 2010-12-12 NOTE — H&P (Signed)
[facility]  Patient:    [name], [name] Visit Number: [id number] MRN: [id number]          Service Type: EMS Location: [facility] Attending Physician:  Hanley Seamen Admit Date:  08/15/2001 Discharge Date: 08/15/2001   CC:         Winn Jock. Earl Gala, M.D.   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  She has had chronic pain usually on the left flank with intermittent hematuria and recurrent UTIs usually Enterococcus.  She has had multiple episodes of pyelonephritis always on the left.  She has been on Ampicillin three weeks for bronchitis.  She had Enterococcus on her last culture about a month ago sensitive to this per Dr. Adele Schilder, in [place], [place].  She has had several shots of Rocephin because of inability to keep down medication.  She comes in now for attempt at partial nephrectomy, understanding the risks including possible need to do a total nephrectomy, persistent urinary drainage i.e. fistula possibility and continue pain.  MEDICATIONS:  Medicines include Prozac 20 mg q.d., Pepcid and Xanax.  ALLERGIES:  DESYREL, DARVOCET and ERYTHROMYCIN.  PAST SURGICAL HISTORY:  Includes cesarean sections, vaginal hysterectomy in August 1984, Stamey cystourethropexy February 1986. She has had previous left oophorectomy and appendectomy. Diagnostic laparoscopic procedure done October 1997 by Rosalyn Gess. Norins, M.D. [facility] because of right lower quadrant pain was negative for endometriosis.  REVIEW OF SYSTEMS:  She has started smoking quite heavily at least a pack of cigarettes per day.  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  She also has a  dependent personality and has been called a dysthymic disorder by Warnell Bureau, M.D. with history of previous drug abuse in the past.  She had a psychiatric admission in March 1992 but none since then.  No cardiac symptomatology.  Bowels are regular but has been nauseated in the last month or two.  FAMILY HISTORY/SOCIAL HISTORY:  She is going through a messy divorce.  She is actually living in [place], [place].  Her husband has her four children.  No family history of diabetes, stones, bleeding disorder or kidney problems.  PHYSICAL EXAMINATION:  VITAL SIGNS:  Temperature is afebrile.  Pulse is 80 and regular. Respirations 18.  Blood pressure 130/82.  GENERAL:  The patient is a pale, blond white female in no acute distress.  HEENT:  Clear.  Oropharynx is benign.  CHEST:  Reveals some course rhonchi, clearing with cough at both bases.  A few wheezes on the right base heard.  No murmurs.  BREASTS:  Not examined.  ABDOMEN:  Soft but with scars in the right lower quadrant in the suprapubic area but moderate left CVA tenderness to percussion. No abdominal masses or organomegaly.  PELVIC:  Reveals good support.  Absent uterus.  Negative bladder.  Good support anteriorly.  EXTREMITIES:  Showed no edema.  Good distal pulses.  Intact sensation to light touch.  IMPRESSION: 1.  Chronic left pyelonephritis. 2.  A 1.4 cm left lower pole renal calculus refractory to     extracorporeal shock wave lithotripsy. 3.  Hematuria with left flank pain. 4.  Dysthymic disorder.  RECOMMENDATIONS:  Partial left nephrectomy as above. Attending Physician:  Hanley Seamen DD:  11/14/96  TD:  11/15/96 Job: [id number]

## 2010-12-12 NOTE — Discharge Summary (Signed)
NAME:  [name], [name]                   ACCOUNT NO.:  [id number]   MEDICAL RECORD NO.:  [id number]                   PATIENT TYPE:  INP   LOCATION:  2905                                 FACILITY:  [facility]   PHYSICIAN:  Alfonse Spruce, M.D.               DATE OF BIRTH:  02-20-1956   DATE OF ADMISSION:  05/29/2003  DATE OF DISCHARGE:  06/06/2003                                 DISCHARGE SUMMARY   FINAL DIAGNOSES:  1. Loss of consciousness associated with history of suicidal attempt.  2. Underlying history of drug rehabilitation followed by the methadone     clinic.  3. Coronary artery disease with the involvement of nonobstructive type of 3     vessels and recommendation with medical therapy  4. Underlying multiple psychiatric disorder with multiple personality     disorder posttraumatic stress syndrome.  5. Underlying abnormal cardiac enzymes with underlying possible     subendocardial ischemia associated with the coronary artery disease.  6. __________ perfusion Cardiolite scan.  7. Status post cardiac catheterization.  D-dimmer was negative and also     spiral CT scan for pulmonary embolism (PE).  8. Underlying hypertension with hypertensive cardiovascular disease.  9. Sinus brachycardia with the effect of beta blockers.  10.      Gastroesophageal reflux disease.  11.      Chronic obstructive pulmonary disease was chronic smoker.  12.      Right lower lobe pneumonia treated with antibiotic and followed by     the chest x-ray as well as the CT.   HOSPITAL COURSE:  The patient initially admitted to the hospital and  monitored and consultation with cardiology was obtained.  Subsequently the  perfusion scan of the heart, Cardiolite was positive.  Underwent cardiac  catheterization and recommendation of medical therapy.  Questionable seizure  disorder was considered;  however, the patient was watched and during her  hospitalization no evidence of seizure and mainly per husband  is  questionable.  The suicidal attempt and behavioral disorder; seen by the  psychiatrist Dr. Jeanie Sewer who recommended to continue the care with her  psychiatrist as an outpatient.   The patient did well and is comfortable at this time and medication was  addressed and changed.  The patient will be stable for discharge home to be  followed by her psychiatrist as well as the cardiologist, as well as her PCP  doctor, Dr. Smith Mince.  During her hospital stay the patient underwent,  because of the hypertension, an ultrasound of the carotid which is  essentially negative.   DISCHARGE MEDICATIONS:  1. Ecotrin 325 mg tablet daily.  2. Methadone per the methadone clinic.  3. Prozac 30 mg p.o. daily.  4. Klonopin 1 mg at h.s.  5. Zyprexa 50 mg at h.s.  6. Antivert 25 mg b.i.d.  7. Lipitor 40 mg daily.  8. Avelox 400 mg daily for 10 days.  9. Plavix 75 mg  p.o. daily.  10.      Toprol XL 25 mg daily.  11.      Nicotine patch 21 mg daily for 1 week.   DISCHARGE INSTRUCTIONS:  Subsequently she will be seeing Dr. Talmadge Coventry in 1 week for adjustment of her medications.  She will be followed  by cardiology as well as the psychiatrist.  The patient is stable,  currently, for discharge and will be followed by her PCP as well.   The cardiac catheterization was done by Dr. Daisey Must, the  cardiologist.                                                Alfonse Spruce, M.D.    Wynn Maudlin  D:  06/06/2003  T:  06/07/2003  Job:  [id number]   cc:   Talmadge Coventry, M.D.  [address]  [place]  [place] [address]  Fax: [phone]   Antonietta Breach, M.D.  [address]  [place], [place] [address]  Fax: [phone]   Carole Binning, M.D. [facility]

## 2010-12-12 NOTE — Cardiovascular Report (Signed)
NAME:  [name], [name]                   ACCOUNT NO.:  [id number]   MEDICAL RECORD NO.:  [id number]                   PATIENT TYPE:  INP   LOCATION:  2905                                 FACILITY:  [facility]   PHYSICIAN:  Carole Binning, M.D. [facility]         DATE OF BIRTH:  Dec 09, 1955   DATE OF PROCEDURE:  06/04/2003  DATE OF DISCHARGE:                              CARDIAC CATHETERIZATION   PROCEDURE PERFORMED:  1. Left heart catheterization with coronary angiography and left     ventriculography.  2. Intravascular ultrasound of the left anterior descending artery.   INDICATION:  [name] is a 55 year old woman who presented after a  syncopal episode.  She did have positive cardiac enzymes consistent with a  small non-Q-wave myocardial infarction.  Dobutamine Cardiolite scan showed  evidence of anterior ischemia.   PROCEDURAL NOTE:  A 6 French sheath was placed in the right femoral artery.  Diagnostic coronary angiography was performed with 6 [place] JL-4 and JR-4  catheters.  Left ventriculography was performed with an angled pigtail  catheter.  After completion of diagnostic angiography, we opted to proceed  with intravascular ultrasound of the left anterior descending artery to  further assess the significance of disease in this vessel.  We utilized a 6  [place] JL-4 guiding catheter.  Heparin was administered to maintain an ACT  of greater than 200 seconds.  A Hi-Torque-Floppy wire was advanced into the  distal LAD.  Intravascular ultrasound was performed of the LAD with  automatic pullback and recording of images.  At the conclusion, the  ultrasound catheter and guide wire were removed.  Angiographic images were  then obtained revealing no evidence of vessel damage to the LAD.  At the  conclusion of the procedure, an Angio-Seal vascular closure device was  placed in the right femoral artery.  There were no complications.   RESULTS:   HEMODYNAMICS:  1. Left  ventricular pressure 170/20.  2. Aortic pressure 164/100.  3. There is no aortic valve gradient.   LEFT VENTRICULOGRAM:  Wall motion is normal.  Ejection fraction is estimated  at greater than or equal to 65%.  There is trace mitral regurgitation.   CORONARY ARTERIOGRAPHY (RIGHT DOMINANT):  Left main is normal.   Left anterior descending artery has a tubular somewhat irregular hazy 50%  stenosis in the mid vessel just after a large septal perforator.  Prior to  this, the LAD gives rise to a large first diagonal branch which has less  than 20% stenosis at its origin.  The distal LAD gives rise to a small  second diagonal branch.   Left circumflex gives rise to a small first marginal branch and a large  branching second marginal.  There is a 40% stenosis in the mid circumflex.   Right coronary artery is a dominant vessel. There is a 20% stenosis in the  mid right coronary artery.  The right coronary artery is otherwise normal  giving rise to  a large posterior descending artery and a normal size  posterior lateral branch.   Intravascular ultrasound of the mid left anterior descending artery reveals  the following:  The diameter of the distal reference vessel is 2.8 x 2.7 mm.  The minimal lumen diameter of the LAD in the area of stenoses was 1.8 x 1.7  mm.  This calculates to a 30% diameter stenosis by intravascular ultrasound  which does not meet criteria for significance.   IMPRESSION:  1. Normal left ventricular systolic function.  2. Moderate coronary artery disease as described.  The disease in the mid     left anterior descending does not appear to be obstructive both by     angiographic and by intravascular ultrasound criteria.   PLAN:  The patient will be managed medically.                                               Carole Binning, M.D. [facility]    MWP/MEDQ  D:  06/04/2003  T:  06/04/2003  Job:  [id number]   cc:   Talmadge Coventry, M.D.  [address]   [place]  [place] [address]  Fax: [phone]   [place] Bing, M.D.

## 2010-12-12 NOTE — Cardiovascular Report (Signed)
NAMEKIMBERLEA, [name]         ACCOUNT NO.:  [id number]   MEDICAL RECORD NO.:  [id number]          PATIENT TYPE:  INP   LOCATION:  6531                         FACILITY:  [facility]   PHYSICIAN:  Arturo Morton. Riley Kill, M.D. [facility] OF BIRTH:  1956/01/06   DATE OF PROCEDURE:  10/07/2005  DATE OF DISCHARGE:                              CARDIAC CATHETERIZATION   INDICATIONS:  [name] is a 55 year old who has had some recurrent  chest discomfort. She also has an esophageal stricture. She has had a  previously known left anterior descending stenosis of approximately 50-70%.  She underwent ultrasound in 2004 demonstrating a luminal area of 1.7 x 1.8  by Dr. Gerri Spore. This was treated medically. The lesion looks perhaps  slightly progressed from the previous study but it did not appear to be  critical. We recommended exercise testing. She underwent exercise testing x2  and did have exertional chest tightness. There was not a definite perfusion  abnormality. After extensive discussion, a repeat ultrasound with  percutaneous intervention of the LAD was recommended. There was a thorough  discussion of all the options by Dr. Diona Browner and then subsequently by me.  Percutaneous intervention was then recommended.   PROCEDURE:  1.  Intravascular ultrasound of the left anterior descending artery.  2.  Percutaneous intervention of the left anterior descending artery with      direct stenting using a 2.5 mm x 13 Cypher stent with postdilatation      using a 2.75 mm PowerSail balloon.   DESCRIPTION OF PROCEDURE:  The patient was brought to the catheterization  laboratory, prepped and draped in the usual fashion. Through an anterior  puncture, the right femoral artery was easily entered. We used a JL-3.5  guiding catheter by [employer]is. Bivalirudin was given according to protocol. A  Prowater wire was then passed down across the lesion. I elected to go ahead  and ultrasound the lesion to try to look at  the ledge just proximal to the  diagonal and also the severity of the lesion. By intravascular ultrasound,  there was a focal area where the lumen was now 1.4 x 1.5 but appeared  relatively tight. With this, we elected to stent the vessel. Extreme care  was taken to try to pass the stent just beyond where the diagonal is but  across the lesion. Direct stenting was then performed using the 2.5 Cypher  drug-eluting platform. Stent was then taken up to about 14 atmospheres. It  appeared to just cover the area of severe disease. Postdilatation was done  with a 2.75 Quantum Maverick balloon. Generous doses of intracoronary  nitroglycerin were administered. Final angiographic views were obtained. We  elected not to repeat the ultrasound at this point as there was full  dilatation of the stent and good deployment without any evidence of edge  tear. All catheters were subsequently removed and the femoral sheath was  sewn into place.   ANGIOGRAPHIC DATA:  The left main coronary artery is free of critical  disease. The left anterior descending artery after the takeoff of the major  diagonal and major septal has a focal area of narrowing that  measures  probably about 70-75% luminal reduction. By intravascular ultrasound, the  distal reference vessel was approximately 2.7 x 2.7. At the lesion site, it  measures 1.4 x 1.5. Following stenting, this area was reduced to 0% residual  luminal narrowing without any obvious compromise of the diagonal.   There is no evidence of distal embolization.   CONCLUSION:  Successful percutaneous stenting of the left anterior  descending artery.   DISPOSITION:  The patient will be followed by Dr. Diona Browner. Importantly, the  patient's symptoms with balloon dilatation were similar to what she has had  on the treadmill. I hope this proves to be the major source of her symptoms.      Arturo Morton. Riley Kill, M.D. [facility]  Electronically Signed     TDS/MEDQ  D:   10/07/2005  T:  10/08/2005  Job:  [id number]   cc:   Jonelle Sidle, M.D. [facility]  [address]  [place]  [place] [address]   CV Laboratory   Talmadge Coventry, M.D.  Fax: [phone]

## 2010-12-12 NOTE — H&P (Signed)
[name], [name]         ACCOUNT NO.:  [id number]   MEDICAL RECORD NO.:  [id number]          PATIENT TYPE:  INP   LOCATION:  1830                         FACILITY:  [facility]   PHYSICIAN:  Vida Roller, M.D.   DATE OF BIRTH:  June 21, 1956   DATE OF ADMISSION:  10/01/2005  DATE OF DISCHARGE:                                HISTORY & PHYSICAL   PRIMARY CARDIOLOGIST:  Dr. Simona Huh   REASON FOR ADMISSION:  [name] is a 55 year old female with history  of known non-obstructive coronary artery disease by two previous  catheterizations who now presents to the emergency room with an  approximately two-week history of recurrent, progressive, non-exertional  chest pressure relieved by nitroglycerin.   Patient's cardiac history notable for a small non ST elevation myocardial  infarction in November 2004 which may be secondary to a ruptured LAD plaque.  Catheterization at that time revealed a 50% LAD lesion which was not  hemodynamically compromised by IVUS.   She then had a second catheterization in May 2006 following presentation  with chest pain and negative serial cardiac markers, again revealing non-  obstructive coronary artery disease with a persistent 50% mid LAD lesion.  Left ventricular function has been normal.   Patient now presents with symptoms which she states are similar to those  prior to her previous catheterizations.  She describes this as upper  anterior chest pressure radiating to the jaw and to the left shoulder and  upper arm.  There is associated dyspnea and her symptoms are promptly  relieved by one nitroglycerin tablet.  However, she has recurrent chest pain  approximately three to four hours later.  These episodes typically occur at  rest but she did have exertional symptoms last night while walking.   Patient awoke this morning at 6:30 with chest pressure which responded to  one nitroglycerin tablet.  She called her primary care physician who in  turn  directed her to our office and patient was advised to come to the emergency  room.  She was transported via EMS, received two nitroglycerin tablets en  route as well as one additional nitroglycerin after admission.  She  currently has some mild residual discomfort and has been placed on IV  nitroglycerin.   Admission electrocardiogram notable for sinus tachycardia at 103 BPM with  presence of new small Q-waves in the inferior leads and change in axis  (RAD).   Patient denies any recent long trips or development of any lower extremity  pain/swelling.   ALLERGIES:  ERYTHROMYCIN, DESYREL, and VANTIN.   HOME MEDICATIONS:  1.  Vytorin 10/80 daily.  2.  Protonix 40 daily.  3.  Cymbalta 30 daily.  4.  Zyprexa 5 q.h.s.  5.  Clopidogrel 75 daily.   PAST MEDICAL HISTORY:  1.  Coronary artery disease as described above.  2.  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  3.  COPD/ongoing tobacco smoking.  4.  Hyperlipidemia.  5.  Hypertension.  6.  Gastroesophageal reflux disease.  7.  History of right  lower lobe pneumonia.  8.  Total hysterectomy.  9.  Left partial nephrectomy/benign mass.  10. 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   SOCIAL HISTORY:  Patient lives in [place] with her husband.  They four  grown children.  She is unemployed.  She has an approximate 40-pack-year  history of tobacco smoking.  She denies alcohol use.   FAMILY HISTORY:  Father died age 52 secondary to myocardial infarction.  She  has a sister in her late 73s reportedly with with a CAD, question diabetes.  Mother age 60, no known heart disease.   REVIEW OF SYSTEMS:  Patient  chronically sleeps on three pills but this  appears secondary to history of reflux and not orthopnea.  She denies any  paroxysmal nocturnal dyspnea, new onset external dyspnea, or lower extremity  edema.  She reports bilateral calf discomfort which is apparently new.  She  denies any recent fever, chills, productive cough, or dysuria.  Denies any  evidence of upper or lower GI bleeding.  Has no known history of peptic  ulcer disease.  Remaining systems negative.   PHYSICAL EXAMINATION:  VITAL SIGNS:  Blood pressure 128/76 on admission,  pulse 103, regular, temperature 97.8, respirations 22, saturations 93% on  room air.  GENERAL:  55 year old female in no apparent distress.  HEENT:  Normocephalic, atraumatic.  NECK:  Preserved bilateral carotid pulse without bruits; no JVD.  LUNGS:  Clear to auscultation in all fields.  HEART:  Regular rate and rhythm (S1, S2).  No murmurs, rubs, or gallops.  ABDOMEN:  Soft, nontender with intact bowel sounds.  EXTREMITIES:  Palpable bilateral femoral pulses without bruits; palpable  posterior tibialis pulses with 1+ lower extremity edema.  NEUROLOGIC:  Flat affect.   Admission chest x-ray:  Pending.  Admission electrocardiogram:  Sinus  tachycardia at 103 BPM with right axis deviation; small Q-waves in leads 2,  3, and aVF which are new;  no acute changes.   LABORATORY DATA:  Cardiac enzymes (POC):  MB less than 1, troponin I less  than 0.05.  Sodium 30, potassium 0.7, BUN 30, creatinine 1.  Hemoglobin  15.6, hematocrit 46.   IMPRESSION:  1.  Crescendo angina pectoris.      1.  Atypical/typical features.      2.  Relieved by nitroglycerin.  2.  Non-obstructive coronary artery disease.      1.  Status post cardiac catheterization in November 2004 and May 2006.      2.  Status post non ST elevation myocardial infarction November 2004          (question secondary to ruptured left anterior descending plaque).     3.  Normal exercise stress  Cardiolite August 2005.  3.  Normal ventricular function.  4.  Multiple cardiac risk factors.      1.  Hypertension.      2.  Hyperlipidemia.      3.  Long-standing tobacco.  5.  History of a atrioventricular block.      1.  On combination beta-blocker/clonidine.  6.  Sinus tachycardia.  7.  Gastroesophageal reflux disease.  8.  Complex psychiatric history.   PLAN:  Patient will be admitted to telemetry for close monitoring and rule  out of myocardial  infarction.  Plan is to proceed with cardiac  catheterization tomorrow to exclude any significant underlying coronary  artery disease.  However, it may be that patient's symptoms are secondary to  coronary vasospasm given that this typically occurs at rest.   Patient will be continued on Plavix and will also be placed on aspirin in  conjunction with IV nitroglycerin and IV heparin.  Of note, we will consider  adding Toprol 25 daily if the D-dimer is negative.  Patient will need close  monitoring; however, given her history of AV block.   Risks/benefits of the procedure have been discussed and patient is agreeable  to proceed.      Gene Serpe, P.A. [facility]      Vida Roller, M.D.  Electronically Signed    GS/MEDQ  D:  10/01/2005  T:  10/01/2005  Job:  [id number]   cc:   Talmadge Coventry, M.D.  Fax: [phone]

## 2010-12-12 NOTE — Cardiovascular Report (Signed)
NAMENATORI, GUDINO         ACCOUNT NO.:  [id number]   MEDICAL RECORD NO.:  [id number]          PATIENT TYPE:  INP   LOCATION:  4741                         FACILITY:  [facility]   PHYSICIAN:  Carole Binning, M.D. LHCDATE OF BIRTH:  [birth year]   DATE OF PROCEDURE:  12/15/2004  DATE OF DISCHARGE:                              CARDIAC CATHETERIZATION   PROCEDURE PERFORMED:  Left heart catheterization with coronary angiography  and left ventriculography.   INDICATIONS:  The patient is a 55 year old woman with history of coronary  artery disease. She underwent cardiac catheterization two  years ago  following a non-ST-segment elevation myocardial infarction. This showed  approximately 50% stenosis in left anterior descending artery. We did an  intravascular ultrasound which by ultrasound criteria, revealed that the  plaque was not hemodynamically significant. The patient has therefore been  managed medically. She presented to the hospital last evening with chest  pain. She has had negative cardiac enzymes. We opted to proceed with cardiac  catheterization to reassess her coronary anatomy.   PROCEDURE NOTE:  A 6-French sheath was placed in the right femoral artery.  Coronary angiography was performed with standard Judkins 6-French catheters.  Left ventriculography was performed with an angled pigtail catheter.  Contrast was Omnipaque. There no complications.   RESULTS:  Hemodynamics:  Left ventricular pressure 160/22. Aortic pressure  140/82. There is no aortic valve gradient on catheter pullback.   Left ventriculogram:  Wall motion is normal, ejection fraction estimated at  60%. There is no mitral regurgitation.   Coronary arteriography:  Left main is normal.  Left anterior descending artery has a tubular 50% stenosis in the mid  vessel. There is possibly mild haziness in the mid portion of this disease.  The LAD gives rise to single large diagonal branch which arises from  the  diseased segment of vessel.  Left circumflex has a 20% stenosis in the proximal vessel and 30% stenosis  in the mid vessel at the origin of the second obtuse marginal branch.  Circumflex gives rise to small first obtuse marginal branch and large second  obtuse marginal branch.  Right coronary is a dominant vessel. There is a diffuse 20% stenosis in the  proximal vessel and less than 20% stenosis in the mid vessel. The distal  right coronary gives rise to large posterior descending artery and two small  posterolateral branches.   IMPRESSION:  1.  Normal left ventricular systolic function.  2.  One-vessel coronary artery disease characterized by moderate disease in      the left anterior descending artery which does not appear to be      obstructive in nature. Furthermore, it does not appear to be      significantly changed compared to previous catheterization. There is a      question of possibly very mild haziness in this segment of vessel.   PLAN:  The patient be managed medically. We will restart her on Plavix  therapy because of her residual disease in the left anterior descending  artery and anticipate long-term Plavix in addition to her ongoing medical  therapy.      MWP/MEDQ  D:  12/15/2004  T:  12/15/2004  Job:  [id number]   cc:   Talmadge Coventry, M.D.  [address]  [place]  [place] [address]  Fax: [phone]

## 2010-12-12 NOTE — Cardiovascular Report (Signed)
NAMEAIJA, SCARFO         ACCOUNT NO.:  [id number]   MEDICAL RECORD NO.:  [id number]          PATIENT TYPE:  INP   LOCATION:  3705                         FACILITY:  [facility]   PHYSICIAN:  Arturo Morton. Riley Kill, M.D. [facility] OF BIRTH:  1955-08-13   DATE OF PROCEDURE:  10/02/2005  DATE OF DISCHARGE:                              CARDIAC CATHETERIZATION   INDICATIONS:  Ms. Sabatino is a 55 year old who has had some recurrent  chest discomfort. She underwent catheterization in 2004 by Dr. Loraine Leriche  Pulsipher. At that time, intravascular ultrasound was done because of a mid-  LAD stenosis that looked hypodense. At that time, it suggested an area of  about 1.6 or 1.7 with a distal reference vessel to 1.8 x 1.7 for a distal  reference vessel of 2.8 x 2.7 with an area of stenosis of 37%. Based on  this, of Dr. Gerri Spore elected medical therapy. She underwent repeat  catheterization in 2006 with similar findings. At this time, ultrasound was  not performed. She now presents with recurrent chest discomfort over the  past 10 days, some with and without exertion. The current study was done to  identify coronary anatomy.   PROCEDURES:  1.  Left heart catheterization.  2.  Selective coronary arteriography.  3.  Selective left ventriculography.   DESCRIPTION OF PROCEDURE:  The patient was brought to the cath lab and  prepped and draped in the usual fashion. Through an anterior puncture, the  right femoral artery was entered. 4-French sheaths were placed. Views of  left and right coronary arteries were obtained in multiple angiographic  projections. Central aortic and left ventricular pressures were measured  with a pigtail. Ventriculography was then performed in the RAO projection.  Following this, I reviewed the films carefully and compared them to the  previous films of 2004-2006. I then reviewed the films with the patient's  husband. There were no complications. She was taken to the holding  area for  direct manual compression.   HEMODYNAMIC DATA:  1.  Central aortic pressure 108/64.  2.  Left ventricular pressure 110/1.  3.  No gradient on pullback across the aortic valve.   ANGIOGRAPHIC DATA:  1.  Ventriculography was done in the RAO projection. Overall systolic      function was preserved. No segmental abnormalities or contraction      identified.  2.  The left main is free of critical disease.  3.  Left anterior descending artery courses to the apex. After the major      diagonal and the major septal, there is an area of moderate segmental      plaquing that measures probably about 50-70% in luminal reduction. It is      slightly hypodense. When compared to the previous studies, it looks      fairly similar. The distal vessel goes to the apical tip.  4.  The circumflex provides a marginal branch and then a large marginal      branch that bifurcates and has about 30% narrowing.  5.  A right coronary is large-caliber vessel with posterior descending and      posterolateral system  with mild eccentric plaquing of about 30%.   CONCLUSION:  1.  Well-preserved left ventricular function.  2.  Continued presence of a moderate left anterior descending artery      stenosis of uncertain hemodynamic significance. Based on the previous      studies, this does not appear to be dramatically changed and if the      patient does have some recurrent episodes of chest pain, physiologic      evaluation with Adenosine-Myoview would be warranted. If this is      positive, then percutaneous intervention with intravascular ultrasound      would be entertained. If this is negative, a continued medical program      might be most appropriate. Given lack of change, she will need to be      followed closely. Discontinuation of smoking would be strongly advised.      Arturo Morton. Riley Kill, M.D. [facility]  Electronically Signed     TDS/MEDQ  D:  10/02/2005  T:  10/03/2005  Job:  [id number]   cc:   CV  Laboratory   Patient's medical record   Jonelle Sidle, M.D. [facility]  [address]  [place]  [place] [address]

## 2010-12-12 NOTE — Discharge Summary (Signed)
NAMEPARTHENA, FERGESON         ACCOUNT NO.:  [id number]   MEDICAL RECORD NO.:  [id number]          PATIENT TYPE:  INP   LOCATION:  3028                         FACILITY:  [facility]   PHYSICIAN:  Renato Battles, M.D.     DATE OF BIRTH:  1956-01-25   DATE OF ADMISSION:  03/04/2005  DATE OF DISCHARGE:  03/08/2005                                 DISCHARGE SUMMARY   REASON FOR ADMISSION:  Benzodiazepine overdose and suicide attempt.   DISCHARGE DIAGNOSES:  1.  Benzodiazepine overdose, status post detoxification.  2.  Suicide attempt.  3.  Depression.  4.  Coronary artery disease.  5.  Hyperlipidemia.  6.  Insomnia and anxiety.  7.  Gastroesophageal reflux disease.   DISCHARGE MEDICATIONS:  1.  Aspirin 325 mg p.o. daily.  2.  Plavix 75 mg p.o. daily.  3.  Rozerem 8 mg p.o. q.h.s. p.r.n.  4.  Vytorin 10/80 one tablet p.o. daily.  5.  Protonix 40 mg p.o. daily.  6.  Cymbalta 30 mg p.o. daily.  7.  Zyprexa 5 mg p.o. q.h.s.  8.  Nicotine patch taper 21 mg one patch a day for two weeks, then 14 mg      patch one daily for two weeks, then 7 mg patch one a day for two weeks,      and then discontinue.   PROCEDURE:  None.   CONSULTATIONS:  Psychiatry--Dr. Williford.   HISTORY & PHYSICAL/HOSPITAL COURSE:  The patient is a pleasant 55 year old  white female with history of depression and anxiety who was admitted for  suicidal ideation and benzodiazepine withdrawal symptoms. The patient  reports that she had increasing anxiety over the last few weeks prior to  admission and she has been using more and more Xanax and had intention to  harm herself. Initial physical exam revealed an anxious patient in no acute  distress, with normal vital signs with lab affect. Otherwise, normal  physical exam, except the pupils were dilated and sluggish. Initial workup  revealed normal electrolytes and blood work. The patient was admitted for  further treatment and management. She was with the sitter 24/7,  on telemetry  initially, and then on regular floor. She was placed on Ativan withdrawal  protocol with tapering doses and frequency of Ativan, and did fine with  that, minimizing anxiety and tremor. She has no complications and was ready  to be discharged by the third of day of admission. Dr. Jeanie Sewer saw the  patient and confirmed that she needs to be discharged for further management  and stabilization of her depression.   Her hospitalization was uneventful. She had transient hypokalemia which  improved with diet and did not even require supplementation. She had an  incident where she was caught smoking in the room with a nicotine patch on  and she was instructed not to try that again and complied with that.   DISCHARGE INSTRUCTIONS:  Diet is low fat, cardiac healthy diet. Activity as  tolerated. Instructions include suicide fairly high. The patient needs to be  monitored at all times. Do not smoke with the patch on.   DISPOSITION:  To  a psychiatric facility at this time, I am not sure which  one.  We will aim for a behavioral health facility. If this is not feasible  then will try to other facilities.      Renato Battles, M.D.  Electronically Signed     SA/MEDQ  D:  03/08/2005  T:  03/08/2005  Job:  [id number]   cc:   Talmadge Coventry, M.D.  [address]  [place]  [place] [address]  Fax: [phone]   Antonietta Breach, M.D.
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
## 2010-12-12 NOTE — Discharge Summary (Signed)
NAMESUMER, [name]         ACCOUNT NO.:  [id number]   MEDICAL RECORD NO.:  [id number]          PATIENT TYPE:  INP   LOCATION:  2001                         FACILITY:  [facility]   PHYSICIAN:  Jonelle Sidle, M.D. LHCDATE OF BIRTH:  1956-02-21   DATE OF ADMISSION:  01/19/2006  DATE OF DISCHARGE:  01/21/2006                                 DISCHARGE SUMMARY   PROCEDURES:  1.  Cardiac catheterization.  2.  Coronary arteriogram.  3.  Left ventriculogram.   TIME AT DISCHARGE:  34 minutes.   PRIMARY DIAGNOSIS:  Chest pain.   SECONDARY DIAGNOSES:  1.  Status post Cypher stent to the left anterior descending in March 2007      after intravascular ultrasound.  2.  Non-ST segment elevation myocardial infarction in November 2004 with      medical therapy.  3.  Chronic obstructive pulmonary disease.  4.  Gastroesophageal reflux disease symptoms.  5.  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 6.  History of right lower lobe pneumonia.  6.  Status post hysterectomy and benign kidney mass.  7.  History of multiple personality disorders, depression and post-traumatic      stress disorder, as well as a suicide attempt.  8.  History of esophageal stricture and dilatation.   FAMILY HISTORY:  Coronary artery disease in her father.   ALLERGIES OR INTOLERANCES:  ERYTHROMYCIN, DESYREL, and VANTIN.   HOSPITAL COURSE:  [name] is a 55 year old female with a known  history of coronary artery disease.  She had chest pain similar to her pre  PCI symptoms and came to the hospital where she was admitted for further  evaluation and treatment.   Her cardiac enzymes were negative for MI but it was felt that cardiac  catheterization was indicated to further define her anatomy and this was  performed on 01/20/2006.  The cardiac catheterization showed a patent stent,  no other significant disease, and an EF of 60%.   As part of her  evaluation, a lipid profile was performed which showed a  total cholesterol 132, triglycerides 40, HDL 37, LDL 87.  She is on Vytorin  10/22 and Dr. Diona Browner evaluated her and decided this should be increased to  10/40 or encouraged diet compliance.   The day after the catheterization, [name] complained of some pain in  her right lower extremity.  There was no evidence of pseudoaneurysm or  hematoma.  She stated that when they did the cath, it felt like they hit a  nerve.  She was provided with reassurance and advised to take Tylenol for  pain.  She is to contact us if her symptoms do not improve.   [name] is pending evaluation by Dr. Diona Browner but is tentatively  considered stable for discharge with outpatient follow-up arranged.   DISCHARGE INSTRUCTIONS:  1.  Her activity level is to be increased gradually.  2.  She is to call our office for problems with the cath site.  3.  She is to follow up with Dr. Diona Browner on July 12 at  4:15 and with Dr.      Smith Mince as needed.   DISCHARGE MEDICATIONS:  1.  Plavix 75 mg a day.  2.  Protonix 40 mg a day.  3.  Toprol XL 25 mg a day.  4.  Vytorin 10/20 mg a day.  5.  Cymbalta 60 mg a day.  6.  Coated aspirin 325 mg a day.  7.  Zyprexa 5 mg 20 mg q.h.s.      Theodore Demark, P.A. [facility]      Jonelle Sidle, M.D. [facility]  Electronically Signed    RB/MEDQ  D:  01/21/2006  T:  01/21/2006  Job:  [id number]   cc:   Talmadge Coventry, M.D.  Fax: [phone]

## 2010-12-12 NOTE — H&P (Signed)
[name], [name]         ACCOUNT NO.:  [id number]   MEDICAL RECORD NO.:  [id number]          PATIENT TYPE:  INP   LOCATION:  1829                         FACILITY:  [facility]   PHYSICIAN:  Reginia Forts, MD     DATE OF BIRTH:  1956-07-24   DATE OF ADMISSION:  01/19/2006  DATE OF DISCHARGE:                                HISTORY & PHYSICAL   CHIEF COMPLAINT:  Chest pain.   [name] is a 55 year old Caucasian woman, with a history of  myocardial infarction, psychiatric illness and a history of PTCI to the LAD  in March of 2007, who presents with seven hours of starting stuttering  substernal chest pain.  The patient has had similar chest pain episodes in  2004 and in March of 2007.  During that time, she underwent a PTCI to the  LAD showing a 75% lesion and ultimately was stented with a Cypher.  Her  symptoms resolved at that time.  Complicating her clinical course was a  history of esophageal strictures for which she underwent dilation in April  of 2007 with further improvement in her substernal chest pressure.  She had  been stable and has been active at home without any limitations in activity  until 4 p.m. today when she developed substernal chest pain and pressure at  rest, associated with shortness of breath and diaphoresis.  She noted relief  with one nitroglycerin but the symptoms recurred later tonight necessitating  an emergency room visit.   En route to the emergency room, she took another nitroglycerin which also  improved her chest pain from a 7/10 to a 4/10.  In the emergency room, she  received IV morphine with continued improvement in her symptoms.  She states  that the pain is the same as her two prior chest pain episodes that required  hospitalization with [facility].  She denies any fevers, chills, orthopnea,  paroxysm nocturnal dyspnea or syncope.   PAST MEDICAL HISTORY:  1.  History of MI non-STEMI in April of 2004.      1.  Cardiac catheterization  demonstrated a 50% LAD by [facility].      2.  Cath in May of 2006 demonstrated a 50% LAD lesion by [facility].      3.  Cath in March of 2007 demonstrated a 75% LAD lesion by [facility].  The          patient received a Cypher 2.5 x 13 mm to the proximal LAD.          Circumflex was patent.  RCA was patent.  OM1 had a 30% lesion and          PDA had a 30% lesion.  2.  Hypertension.  3.  Hyperlipidemia.  4.  Left Baker's cyst.  5.  History of 2:1 AV block secondary to beta blocker and Clonidine.  6.  COPD.  7.  GERD.  8.  History of right lower lobe pneumonia.  9.  Hysterectomy.  10. Left benign kidney mass resection.  11. Multiple personality disorder.  12. History of depression.  13. PTSD.  14. History of suicide attempt.  15. Esophageal stricture status post dilation in April of 2007.   ALLERGIES:  1.  ERYTHROMYCIN.  2.  DESYREL.  3.  VANTIN.   MEDICATIONS:  1.  Aspirin 325 mg once a day.  2.  Plavix 75 mg once a day.  3.  Cymbalta 30 mg once a day.  4.  Toprol XL 25 mg once a day.  5.  Zyprexa 5 mg once at night.  6.  Vytorin 10/20 mg once a day.  7.  Protonix 40 mg a day.   SOCIAL HISTORY:  The patient lives in [place] with her husband.  She is  married and has 40-pack-year smoking history and continues to smoke half a  pack a day.  She denies any alcohol or drug use.  She is disabled.  She has  a history of opioids and benzodiazepines use requiring drug rehabilitation  in the past.   FAMILY HISTORY:  Notable for mother who is age 82 with no heart disease.  Father who died at age 37 from a myocardial infarction and a sister who  developed coronary artery disease in her late 27s.   REVIEW OF SYSTEMS:  Notable for symptoms per HPI, GERD, occasional mood  disturbances with depression and anxiety.  The rest of review of systems was  reviewed and is negative.   PHYSICAL EXAMINATION:  VITAL SIGNS:  Temperature is 97.5, pulse 70, blood  pressure 145/90, respiratory rate is 16.   Saturating 98% on room air.  GENERAL:  The patient is awake, alert and oriented x3 in no acute distress.  HEENT:  Normocephalic and atraumatic.  Pupils are equal, round, and reactive  to light.  Extraocular movements are intact.  NECK:  Shows no JVD and no carotid bruits.  No lymphadenopathy.  CARDIOVASCULAR:  Regular rhythm.  Normal rate.  No murmurs, rubs, gallops.  LUNGS:  Clear to auscultation bilaterally.  ABDOMEN:  Positive bowel sounds, soft, nontender, and nondistended.  EXTREMITIES:  No clubbing, cyanosis, or edema.  NEUROLOGICAL:  Cranial nerves II through XII grossly intact.  No focal  musculoskeletal or sensory deficits.  MUSCULOSKELETAL:  Demonstrates no significant tenderness, deformities.  PSYCHIATRIC:  Demonstrates stable affect.   Chest x-ray demonstrates no cardiopulmonary process.  EKG demonstrates  normal sinus rhythm with nonspecific ST changes in lead V1.  EKG is in the  setting of 5/10 chest pain and appears relatively unchanged from EKG in  October 07, 2005.  Troponin is less than 0.05.  CK is 47, MB is 4, BUN is 18,  creatinine is 0.9.   ASSESSMENT:  This is a 55 year old Caucasian woman with recent percutaneous  transluminal coronary intervention to the left anterior descending artery  with mild residual nonobstructive coronary disease who presents with  symptoms concerning for acute coronary syndrome.  In the setting of her  previous hospitalizations and stress tests which have been negative for  ischemia, the likelihood of this being a significant acute coronary syndrome  is low.   PLAN:  1.  Coronary artery disease:  We will rule out myocardial infarction with      serial cardiac markers.  The patient will be placed on heparin drip and      will be continued on Toprol XL.  Lisinopril 5 mg will be added for blood      pressure control.  Pending the results of the serial markers, it will be     determined whether the patient will require repeat cardiac  catheterization versus a possible stress test which may be more likely.  2.  Psychiatric:  The patient will continue on her current medications.  3.  Disposition:  The patient will be admitted under the ACS protocol with      expected length of stay of three to five days.      Reginia Forts, MD  Electronically Signed     RA/MEDQ  D:  01/20/2006  T:  01/20/2006  Job:  [id number]

## 2010-12-12 NOTE — H&P (Signed)
NAME:  KOURTLYNN, TREVOR                   ACCOUNT NO.:  [id number]   MEDICAL RECORD NO.:  [id number]                   PATIENT TYPE:  INP   LOCATION:  2108                                 FACILITY:  [facility]   PHYSICIAN:  Alfonse Spruce, M.D.               DATE OF BIRTH:  [birth year]   DATE OF ADMISSION:  05/29/2003  DATE OF DISCHARGE:                                HISTORY & PHYSICAL   PRIMARY CARE PHYSICIAN:  Talmadge Coventry, M.D.   CHIEF COMPLAINT:  The patient was brought by EMS to the emergency room with  a loss of consciousness as she slumped over her coffee table on an ashtray,  was not responding to verbal stimuli or physical contact.   HISTORY OF PRESENT ILLNESS:  The patient, who is a 55 year old white female,  was found by her husband sitting on the coffee table slumped over the  ashtray with no reaction, no shaking, flacid, not responding to verbal  stimuli or physical contact, and she is sleeping with snoring, no  convulsion, no jerking.  It was around 4:55 p.m. when he came from work.  The patient's husband stated that he talked to her at lunchtime, and she was  fine.  The patient is awake, alert at the time of the examination in the  emergency room; however, she was hypotensive with blood pressure ranging  between 90-95; however, when she was admitted to the ER it was 185/120 and  dropped to 92/61.   At the time of her arrival to the emergency room, she was found to be  obtunded, lethargic, sleeping.  She was given Narcan 2 mg followed by 2 mg  and the EMS gave her 1 mg.  The patient stated herself that she was ironing  and she felt weak, dizzy at that time.  She unplugged the iron and sat on  the table as well as put out her cigarette in the ashtray.  Ss she sat down,  she slumped over.  Subsequently she did not know what happened in the  subsequent time.  She denied any incontinence of bowel or bladder.  The  husband subsequently called 911, and the EMS came  and picked her up, and  they could not start an IV line.  As well in the emergency room, we could  not start an IV line, and the IV team tried also to start IV line; however,  no success.   Then to avoid major central line, we will start a temporary IV in the left  foot until IV line started in the upper extremity, especially with the  hypotension and need for IV fluids.   PAST MEDICAL HISTORY:  1. Hysterectomy with oophorectomy.  2. She had a history of partial nephrectomy for benign cyst.  3. She had a right lateral carpal tunnel by the orthopedic.  4. She is a chronic smoker, smoked a pack per day for more than 15 years.  5. She has a history of esophageal stricture and underwent dilatation by GI     eight years ago.  6. History of GERD disease.  7. She had also a past history of and currently treated for multiple     personality disorder.  8. Post-traumatic stress disorder.  9. She had a history of edema of the joints and build up, swelling and they     do not know yet if it is due to other reasons.  However, she went to [facility] and they gave her a name for her illness.  She does not know it or     her husband.  Dr. Smith Mince may know that.  10.      She had asthma.  11.      Chronic obstructive pulmonary disease.  12.      History of TIAs ten years ago.   FAMILY HISTORY:  Father died at the age of 41 years and mother is living at  the age of 45 and she has rheumatoid arthritis.  She has brothers and  sister.  One sister has kidney disease.   ALLERGIES:  She is allergic to [facility] tablet.   HABITS:  No alcohol intake.  She has smoked one pack per day x15 years.  Her  physician psychiatrist, Dr. Sherilyn Banker.  She also goes to the methadone  clinic for rehabilitation from prescription drugs as Percocet, and she is  receiving 7 mg of methadone.   MEDICATIONS:  1. Clonidine 0.1 mg q.i.d. which she takes b.i.d.  2. Clonazepam 1 mg, 1-2 tablets at bedtime.  3. Demadex  20 mg b.i.d.  4. Abilify 5 mg t.i.d.  5. Zyprexa 5 mg tablets; however, she takes three tablets equal to 15 mg at     bedtime.   REVIEW OF SYSTEMS:  NEUROPSYCHIATRY:  History of TIA eight years ago but no  history of stroke.   LABORATORY DATA:  The patient had a CAT scan on admission which was  essentially negative.  She had also had x-ray indicating that she may have  atelectasis; however, when I review questionable pneumonitis of the left  lower lung is considered.   She had a leukocytosis by the laboratories and renal function was in the  borderline with a creatinine of 1.3.  Sodium 140, potassium 4, blood sugar  106, chloride 108, carbon dioxide 26.   PHYSICAL EXAMINATION:  VITAL SIGNS:  Blood pressure was, as mentioned on  admission, 185/120 and subsequently dropped to 91/59.  With the hypotension  the patient's at home blood pressure 140/95.  HEENT:  The head was normocephalic, atraumatic.  The pupils were reactive.  She had dentures upper and lower.  GENERAL:  She was able to speak to me and at this time she is alert,  oriented x3.  NECK:  Supple.  No JVD.  No thyromegaly.  No lymphadenopathy.  Trachea  midline.  CHEST:  Clear to auscultation and percussion; however, decreased air entry  on the lower bases.  HEART:  Normal sinus rhythm.  Mild cardiomegaly.  ABDOMEN:  Protuberant, soft, positive bowel sounds and no tenderness.  Scar  from previous surgery.  EXTREMITIES:  There is no edema and positive pulses.  The left knee was  bothering her; however, no evidence of effusion.  The pulses were intact on  the lower extremities.  NEUROLOGICAL:  Grossly intact with only a feeling of numbness; however,  there is no impairment of sensation  and normal motor and sensory.  Cranial  nerves were intact.  Deep tendon reflexes were normal.  No pitting edema as  well.   IMPRESSION:  1. Loss of consciousness with underlying witnessed. 2. Hypotension with a history of hypertension.   3. Underlying atelectasis versus pneumonia.  4. Rule out sepsis.  5. Abnormal cardiac enzymes with mild elevation of Troponin.  6. Multiple personality disorder.  7. Post-traumatic stress disorder.  8. Chronic obstructive pulmonary disease.  9. Smoker.  10.      Questionable right lower lobe pneumonia versus atelectasis.  11.      Leukocytosis.  12.      Gastroesophageal reflux disease.   PLAN:  We will be admitting the patient to the stepdown ICU with the  underlying hypotension, hydration with normal saline, starting with bolus  500 followed by 150 mL/hr and a chest x-ray in the a.m.  CBC with  differential in the a.m.  Blood culture planned too, first set now and  followed by blood cultures x2, second set if the temperature is 101 or  above.  Also I spoke to Kappa from [employer] to see the patient in  consultation and further evaluation with a psychiatrist and starting Zosyn  IV piggyback q.6 h., 3.375.  Tomorrow will be followed by the [facility], one of my associates.   LABORATORY DATA:  On admission from the ER, indicated pH 7.297, pCO2 52.6  and she was, at that time, under the effect of respiratory acidosis;  however, pO2 was 294.  Creatinine 1.3.  White count 22.3, hemoglobin 15,  hematocrit 42.8, MCV 94.5, and platelet count 181.  CK total 101, CK-MB 3,  index 3, and Troponin recorded as 0.05 by the ER physician.  However, I  could not find it on the laboratory.   PLAN:  As mentioned, the patient will be admitted and will be on clear-  liquid diet and monitored in the stepdown ICU, with IV fluids and we will  hold her medication except Ambien 10 mg at bedtime as well as Tylenol gr 10  q.4-6 h. p.r.n.                                                Alfonse Spruce, M.D.    Wynn Maudlin  D:  05/29/2003  T:  05/30/2003  Job:  [id number]   cc:   Talmadge Coventry, M.D.  [address]  [place]  [place] [address]  Fax: [phone]

## 2010-12-12 NOTE — Discharge Summary (Signed)
NAMESHERLE, MELLO         ACCOUNT NO.:  [id number]   MEDICAL RECORD NO.:  [id number]          PATIENT TYPE:  INP   LOCATION:  2001                         FACILITY:  [facility]   PHYSICIAN:  Jonelle Sidle, M.D. LHCDATE OF BIRTH:  1956/07/24   DATE OF ADMISSION:  01/19/2006  DATE OF DISCHARGE:  01/21/2006                                 DISCHARGE SUMMARY   PROCEDURES:  1.  Cardiac catheterization.  2.  Coronary arteriogram.  3.  Left ventriculogram.   TIME AT DISCHARGE:  34 minutes.   CHIEF COMPLAINT:  Chest pain, cardiac enzymes negative for MI, and status  post cardiac catheterization without critical coronary artery disease.   SECONDARY DIAGNOSES:  1.  Non-ST segment elevation myocardial infarction in April 2004.  2.  Status post cardiac catheterization in March of 2007 with IVUS showing a      75% left anterior descending lesion treated with Cypher stent.  3.  Hypertension.  4.  Hyperlipidemia.  5.  Left Baker's cyst.  6.  History of 2:1 AV block secondary to a combination of beta blocker and      clonidine.  7.  Chronic obstructive pulmonary disease.  8.  Gastroesophageal reflux disease symptoms.  9.  History of right lower lobe pneumonia.   Dictation ended at this point.      Theodore Demark, P.A. [facility]      Jonelle Sidle, M.D. [facility]     RB/MEDQ  D:  01/21/2006  T:  01/21/2006  Job:  [id number]

## 2010-12-12 NOTE — H&P (Signed)
[place]  Patient:    [name], [name] Visit Number: [id number] MRN: [id number]          Service Type: EMS Location: [facility] Attending Physician:  Hanley Seamen Admit Date:  08/15/2001 Discharge Date: 08/15/2001   CC:         Winn Jock. Earl Gala, M.D.                         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  She has had chronic pain, usually on the left flank, intermittent hematuria, with recurrent urinary tract infection.  It is usually Enterococcus.  She has had multiple episodes of pyelonephritis, always on the left.  She has been on ampicillin for three weeks for bronchitis.  She had Enterococcus on her last culture about one month ago sensitive to this, per Dr. Adele Schilder in [place], [place].  She has had several shots of Rocephin, because of the inability to keep down medication.  She comes in now for an attempt at a partial nephrectomy, understanding the risks, including possible need to do a total nephrectomy, persistent urinary drainage i.e. fistula Attending Physician:  Molpus, John L DD:  11/14/96 TD:  11/15/96 Job: [id number]

## 2010-12-12 NOTE — H&P (Signed)
[name], [name]         ACCOUNT NO.:  [id number]   MEDICAL RECORD NO.:  [id number]          PATIENT TYPE:  IPS   LOCATION:  0305                          FACILITY:  [facility]   PHYSICIAN:  Geoffery Lyons, M.D.      DATE OF BIRTH:  September 06, 1955   DATE OF ADMISSION:  08/10/2005  DATE OF DISCHARGE:                         PSYCHIATRIC ADMISSION ASSESSMENT   IDENTIFYING INFORMATION:  This is a 55 year old married white female  voluntarily admitted on August 10, 2005.   HISTORY OF PRESENT ILLNESS:  The patient presents with a history of  depression and suicidal ideation.  Has been abusing OxyContin for the past  couple of months.  The patient reports her depression has also been  increasing over the past few months with plans to overdose.  The patient has  been taking 20-40 mg daily of OxyContin x3 weeks.  She does report a long  history of opiate abuse.  Relapsing this past December.  Last use was on  August 10, 2005.  Feeling very depressed and has not been sleeping well.   PAST PSYCHIATRIC HISTORY:  Second visit to [facility].  Was  here in August of 2006.  Sees Dr. Madaline Guthrie as an outpatient.  Has a history  of bipolar disorder as a suicide attempt but cutting her wrist and  overdosing on Ativan in the past.  She also has a Warden/ranger, Lorenda Cahill.   SOCIAL HISTORY:  This is a 55 year old married white female, married for 32  years.  Has four children.  They are all adult.  She has a 12th grade  education.  Unemployed.  Lives with her husband.  No legal issues.   FAMILY HISTORY:  Denies.   ALCOHOL/DRUG HISTORY:  The patient smokes.  Denies any alcohol or any other  drug use.   PRIMARY CARE PHYSICIAN:  Dr. Talmadge Coventry in [place].   MEDICAL PROBLEMS:  Gastroesophageal reflux disease, chronic obstructive  pulmonary disease and patient had a myocardial infarction in 2004.   MEDICATIONS:  Has been on Plavix 75 mg daily, Protonix 40 mg daily, Zyprexa  5 mg  at bedtime, Librium 25 mg p.o. t.i.d., Cymbalta 30 mg daily, Vytorin  and Halcion.   ALLERGIES:  DESYREL (patient reports an anaphylactic reaction).   PHYSICAL EXAMINATION:  Done at [facility].  This is a middle-aged, tired-  appearing female with redden eyes, somewhat shaky.  Temperature is 98, heart  rate 87, respirations 18, blood pressure 168/107, height 5 feet 6 inches  tall, weight 175 pounds.   LABORATORY DATA:  Her CBC is within normal limits.  Albumin is 3.4.  Urine  drug screen was positive for benzodiazepines.  Alcohol level is less than 5.   MENTAL STATUS EXAM:  Fully alert, cooperative female with good eye contact.  Dressed in [place].  Her speech is clear, normal pace and tone.  The patient  feels tired and depressed.  The patient appears sad and tired-appearing.  Thought processes are coherent with no evidence of psychosis.  Cognitive  function intact.  Memory is good.  Judgment is poor.  Insight is limited.  Average intelligence.  Concentration intact.  DIAGNOSES:  AXIS I:  Bipolar disorder.  Opiate dependence.  AXIS II:  Deferred.  AXIS III:  Gastroesophageal reflux disease, status post myocardial  infarction in 2004.  AXIS IV:  Medical problems, other psychosocial problems related to recent  drug use.  AXIS V:  Current 35.   PLAN:  To detox the patient.  Work on relapse prevention.  Will monitor  withdrawal symptoms.  Will resume her current medications.  Will have a  family session with husband for support and education.  The patient is to  follow up with the [facility] program and continue to follow up with her  outpatient psychiatric treatment.   TENTATIVE LENGTH OF STAY:  Five to six days.      Landry Corporal, N.P.      Geoffery Lyons, M.D.  Electronically Signed    JO/MEDQ  D:  08/12/2005  T:  08/12/2005  Job:  [id number]

## 2010-12-12 NOTE — Assessment & Plan Note (Signed)
[facility]                              CARDIOLOGY OFFICE NOTE   NAME:[name], [name]                MRN:          [id number]  DATE:02/04/2006                            DOB:          11/05/1955    PRIMARY CARE PHYSICIAN:  Talmadge Coventry, M.D.   REASON FOR VISIT:  Followup of recent hospitalization.   HISTORY OF PRESENT ILLNESS:  [name] was last in the office back in  April.  She has a history of coronary artery disease with drug-eluting stent  placement to the left anterior descending as well as symptoms of reflux and  prior esophageal stricture requiring dilatation.  She was seen by  Gastroenterology in April and underwent endoscopy in May demonstrating  duodenitis, esophagitis with reflux, and a partial stricture in the distal  esophagus.  This area was dilated.  She was also placed on a proton pump  inhibitor at that time.  She was also recently admitted to the hospital in  June with recurrent chest pain.  She was ruled out for myocardial infarction  by serial cardiac markers and was taken to the Cardiac Catheterization Lab  by Dr. Charlton Haws.  This study revealed a widely patent stent in the left  anterior descending without any major progressive atherosclerosis.  Medical  therapy was anticipated and going forward.  She comes in the office today  stating that she has had no chest discomfort whatsoever and reports  compliance with her medications.  She does tell me parenthetically that she  has had some problems with a cough and sometimes posttussive emesis.  She  has not yet seen [name] about this.   ALLERGIES:  1.  VANTIN.  2.  ERYTHROMYCIN.  3.  DESYREL.   PRESENT MEDICATIONS:  1.  Enteric-coated aspirin 325 mg p.o. q.d.  2.  Plavix 75 mg p.o. q.d.  3.  Cymbalta 60 mg p.o. q.d.  4.  Zyprexa 20 mg p.o. q.h.s.  5.  Vytorin 10/80 mg p.o. q.d.  6.  Toprol XL 25 mg p.o. q.d.  7.  Multivitamin 1 p.o. q.d.  8.   Protonix 40 mg p.o. b.i.d.   REVIEW OF SYSTEMS:  As described in History of Present Illness.  She has not  had any problems with her groin site status post cardiac catheterization.   EXAMINATION:  Blood pressure 134/84.  Heart rate 79.  Weight 180 pounds.  The patient is in no acute distress.  Examination of the neck shows no elevated jugular venous pressure, without  bruits.  LUNGS:  Generally clear without labored breathing or egophony.  No wheezing  is noted.  CARDIAC:  A regular rate and rhythm without murmur or gallop.  EXTREMITIES:  No significant pitting edema.   IMPRESSION AND RECOMMENDATIONS:  1.  Known coronary artery disease status post drug-eluting stent placement      to the left anterior descending.  This was recently re-evaluated at      angiography on January 20, 2006, with findings of a widely-patent stent.      No other major obstructive disease.  Left ventriculography was normal at  that time at 60%.  The plan will be to continue medical therapy and I      will see her back over the next six months.  2.  History of esophagitis, duodenitis, and esophageal stricture, status      post dilatation in May.  3.  Recent cough with some posttussive emesis.  I have asked the patient to      follow up with [name] and have recommended some cough      suppressants that she could get over-the-counter.                                Jonelle Sidle, MD    SGM/MedQ  DD:  02/04/2006  DT:  02/04/2006  Job #:  [id number]   cc:   Talmadge Coventry, MD

## 2010-12-12 NOTE — Discharge Summary (Signed)
[name], [name]         ACCOUNT NO.:  [id number]   MEDICAL RECORD NO.:  [id number]          PATIENT TYPE:  INP   LOCATION:  6531                         FACILITY:  [facility]   PHYSICIAN:  Vida Roller, M.D.   DATE OF BIRTH:  Jan 14, 1956   DATE OF ADMISSION:  10/01/2005  DATE OF DISCHARGE:  10/08/2005                           DISCHARGE SUMMARY - REFERRING   SUMMARY OF HISTORY:  [name] is a 55 year old female with no non-  obstructive coronary artery disease by catheterizations.  She presented with  a two-week history of chest discomfort radiating to her jaw and left upper  extremity associated shortness of breath and usually relieved with  sublingual nitroglycerin.  However, this episode has lasted three to four  hours and her symptoms are reminiscent of prior catheterization.  Past  medical history is notable for non Q-wave myocardial infarction secondary to  a possible ruptured LAD plaque.  Last catheterization in May 2006 showed non-  obstructive coronary artery disease with an EF of 60%.  She has a history of  2:1 AV block secondary to beta blockers and clonidine, normal stress test in  August 2005, COPD, hypertension, hyperlipidemia, multiple personality  disorder, PTSD, depression, suicide attempt, drug rehabilitation, and  continued tobacco use.   LABORATORY DATA:  CK-MBs and relative indexes and troponins were negative  for myocardial infarction.  TSH was 0.966.  Sodium was 140, potassium 3.7,  BUN 20, creatinine 1, glucose 98, AST and ALT were slightly elevated at 66  and 108.  PTT was 32, PT 11.7, H&H 13.4 and 39.4, normal indices.  Platelets  236, WBCs 8.2.  Subsequent chemistries and hematologies were essentially  unremarkable.  EKG showed sinus bradycardia, normal axis, nonspecific ST-T  wave changes throughout her stay.   HOSPITAL COURSE:  The patient was placed on IV heparin as well as her home  medications.  Case management assisted with discharge  needs.  [name]  underwent cardiac catheterization on October 02, 2005 by Dr. Riley Kill.  He felt  that her LAD looked similar to 2004 and 2006 and felt that she should  continue medical treatment with cardiac risk factor modification.  Myoview  was ordered for completion of evaluation of her LAD lesion.  This was  performed on the 11th; however, it as noted as a walking only stress test as  there was no Myoview available.  However, this was arranged for the  following day as that Myoview was available.  On October 06, 2005 stress test  was performed.  However, it was noted that the stress test was complicated  because the patient did not give a warning she does let go of the bar on the  treadmill and sustained some chemosis and abrasions to both these knees.  Follow-up on her fall did not reveal any injuries.  Dr. Diona Browner spent a  considerable amount of time on October 06, 2005 with the patient.  Long  discussion in regards to medical treatment, her re-occurring symptoms, and  her continued tobacco use.  Myoview did not show any ischemia with a normal  EF.  On October 07, 2005 PCI of the  LAD was performed by Dr. Riley Kill.  By  October 08, 2005 it was felt that she could be discharged home.   DISCHARGE DIAGNOSIS:  1.  UAP.  2.  Normocytic anemia.  3.  Tobacco use.  4.  History as noted below.   PROCEDURES PERFORMED:  Cardiac catheterization and status post Cypher  stenting of the left anterior descending on October 07, 2005, history as  previously.   DISPOSITION:  Patient was discharged home.  Asked to maintain low salt, fat,  cholesterol diet.  Activities and wound care per supplemental discharge  sheet.  Medications include Vytorin 10/80 mg q.h.s., Protonix 40 mg daily,  Cymbalta 30 mg daily, __________ 5 mg q.h.s., Plavix 75 mg daily, aspirin  325 daily, nitroglycerin 0.4 as needed.  She will follow up with Dr.  Diona Browner on October 27, 2005 at 2:15 p.m.      Joellyn Rued, P.A.  [facility]      Vida Roller, M.D.  Electronically Signed    EW/MEDQ  D:  01/27/2006  T:  01/27/2006  Job:  [id number]   cc:   Jonelle Sidle, M.D. [facility]  [address]  [place]  [place] [address]

## 2010-12-12 NOTE — Consult Note (Signed)
NAME:  [name], STROUGH                   ACCOUNT NO.:  [id number]   MEDICAL RECORD NO.:  [id number]                   PATIENT TYPE:  INP   LOCATION:  2108                                 FACILITY:  [facility]   PHYSICIAN:  Golden Bing, M.D.               DATE OF BIRTH:  1956/01/07   DATE OF CONSULTATION:  DATE OF DISCHARGE:                                   CONSULTATION   REFERRING PHYSICIAN:  Dr. Alfonse Spruce.   PRIMARY PHYSICIAN:  Dr. Verline Lema.   HISTORY OF PRESENT ILLNESS:  The patient is a 55 year old woman admitted for  syncopal episode.  The patient has no significant cardiovascular history.  She was told of mitral valve prolapse in the past.  Approximately three  weeks ago, she fell while folding clothes.  She did not have premonitory  symptoms.  She believes that she transiently lost consciousness.  She did  have a bruise on her forehead, but apparently no significant injury.  She  has subsequently felt dizzy, and apparently lost consciousness, slipping  forward onto a table.  Her husband was present at that time and reported  that she as flaccid and not reporting to voice.  She has sonorous  respirations.  The patient does not recall the episode.  She remained  unresponsive until arrival in the emergency department.  Narcan was  administered.  A blood pressure of 185/120 was initially obtained, but the  next blood pressure three hours later was 92/61.  Heart rate was normal.  Respirations were rapid.  No opiates were detected on her initial drug  screen despite the fact that she reports treatment with methadone.  Initial  renal function was normal.  White count was 22,300 with a left shift.  Initial CPK was 100 with an MB of 3 and a troponin of 0.07.  On subsequent  determinations, CPK increased to over 500, MB to nearly 25, and troponin to  0.25.   PAST MEDICAL HISTORY:  Notable for partial nephrectomy for a benign lesion,  bilateral carpal tunnel  release and a hysterectomy.  The patient reports  oophorectomy at the same time, but does not provide any reason why that  procedure would have been performed.  She has GERD and has required  dilatation of an esophageal stricture.  She has multiple psyche problems  characterized as posttraumatic stress disorder, and multiple personality  disorder.  There is a history of COPD and asthma due to tobacco use.  She  reports a remote [place].   FAMILY HISTORY:  Father died at a young age due to coronary disease.   SOCIAL HISTORY:  Total smoking history of 15 pack years.  Denies excessive  alcohol.  Has not used street drugs.  Is being treated with methadone to  withdraw her from prescription narcotics.   OTHER MEDICATION:  1. Clonidine 0.1 mg q.i.d.  2. Clonazepam 1 mg q.h.s.  3. Demadex 20 mg b.i.d.  4. Abilify.  5. Zyprexa.   REVIEW OF SYSTEMS:  Chronic mild pedal edema.  Borderline hypertension.  Class 2 dyspnea on exertion, weakness and numbness in her left foot and leg.  Urinary urgency, need for corrective lenses.  Difficulty falling to sleep.   PHYSICAL EXAMINATION:  GENERAL:  A pleasant, well-nourished, well-spoken  woman in no acute distress.  VITAL SIGNS:  Heart rate 70 and regular.  Blood pressure 120/60,  respirations 16, temperature 96.3.  HEENT:  Nonicteric sclerae.  NECK:  No jugular venous distension, no carotid bruits.  ENDOCRINE:  No thyromegaly.  HEMATOPOIETIC:  No adenopathy.  LUNGS:  Clear.  CARDIAC:  Normal first and second heart sounds.  ABDOMEN:  Soft, nontender, no organomegaly.  Normal bowel sounds.  EXTREMITIES:  No edema; normal distal pulses.  NEUROMUSCULAR:  Symmetric strength and tone.  SKIN:  No significant lesions.   EKG:  Sinus bradycardia; within normal limits.  Other EKG is also normal  except for the initial EKG where interpretation was impaired by marked  muscle artifact.   IMPRESSION:  The patient presents with a prolonged loss of  consciousness  that is not consistent with syncope due to a cardiac cause.  Blood pressure  was elevated when she first arrived, further supporting this notion.  She  does have abnormal cardiac markers, but with a 5% MB fraction, and troponin  of 0.25.  These are not diagnostic for a myocardial source.  I agree with  the workup as outline.  In addition, TSH will be obtained as well as  orthostatic vital signs.  It is likely that her episode was due to either  drug excess or drug withdrawal.  I will be happy to follow her and assist  with the interpretation of her cardiac tests.                                               Schleicher Bing, M.D.    RR/MEDQ  D:  05/30/2003  T:  05/31/2003  Job:  [id number]

## 2010-12-12 NOTE — H&P (Signed)
[name], [name]         ACCOUNT NO.:  [id number]   MEDICAL RECORD NO.:  [id number]          PATIENT TYPE:  INP   LOCATION:  1827                         FACILITY:  [facility]   PHYSICIAN:  Lorain Childes, M.D. LHCDATE OF BIRTH:  10-Aug-1955   DATE OF ADMISSION:  12/14/2004  DATE OF DISCHARGE:                                HISTORY & PHYSICAL   PHYSICIANS:  1.  Talmadge Coventry, M.D., primary care physician.  2.  Lorain Childes, M.D., primary cardiologist.   CHIEF COMPLAINT:  Chest pain.   HISTORY OF PRESENT ILLNESS:  The patient is a 55 year old female with  history of nonobstructive CAD by catheterization on November 2004 who is now  admitted with chest pain. The patient reports that she was watching T.V.  this afternoon when she developed pain in her chest radiating up to her jaw  around 4 p.m. She described the pain as pressure. She denied any nausea or  diaphoresis but she reports that there was radiation to her neck and jaw and  she also had mild shortness of breath. The patient states the pain persisted  throughout the evening and increased to approximately an 8 out of 10 but  then she discussed it with her husband and she was brought to the emergency  room for further evaluation. She was given sublingual nitroglycerin in the  ER which decreased her pain down to a 2 out of 10. Currently, she reports  minimal chest discomfort.   On further questioning, she reports her last episode of chest pain at rest  was similar to this was approximately three months ago and lasted  approximately 45 minutes and then resolved on its own. She does not take  nitroglycerin at home. She does report she is having increasing fatigue over  the past three months with exertional chest pain which is relieved with  rest. Her ambulation has decreased as her exertional chest discomfort has  increased.   PAST MEDICAL HISTORY:  1.  CAD:  She is status post cardiac catheterization on  June 04, 2003 for      a non-ST segment elevation. Her EF was noted to be 65%. She had trace      mitral regurgitation. Her left main was normal. LAD had a 50% hazy      lesion which was IBUS at the time and found to have a 37% stenoses. Her      D1 had a 20% proximal lesion. Left circumflex had a 40% mid-stenosis and      RCA had a 20% midstenosis.  2.  History of syncope in November of 2004.  3.  History of 2:1 heart block when she was on a beta blocker, Clonidine and      also related to a vasovagal episode.  4.  History of depression.  5.  Multiple personality disorder.  6.  PTSD.  7.  History of synovitis.  8.  History of asthma/COPD.  9.  Hyperlipidemia.  10. Gastroesophageal reflux disease.  11. History of TIA greater than 10 years ago.  12. Status post TAH/BSO.  13. Status post partial nephrectomy for benign cyst  in childhood.   MEDICATIONS:  1.  Aspirin 1 tablet daily.  2.  Protonix 40 mg p.o. daily.  3.  Rozerem 8 mg p.o. q.h.s.  4.  Xanax 2 mg p.o. q.a.m., 2 mg p.o. at lunch time, and 4 mg p.o. q.h.s.  5.  Zyprexa 25 mg p.o. q.h.s.  6.  Vytorin 10/80 1 tablet p.o. q.h.s.  7.  Cymbalta 90 mg p.o. daily.  8.  Furosemide 20 mg p.o. b.i.d.   SOCIAL HISTORY:  She lives in [place] with her husband. She has 18-pack  year history of tobacco. She currently smokes one and a half packs per day.  She denies any alcohol. No drugs. She has been off of methadone for the past  one year. She does have a history of polysubstance abuse. She denies any  herbal medicines. She follows a regular diet. She denies any regular  exercise.   FAMILY HISTORY:  Her mother is alive at the age of 30 with relatively good  health. Father died at the age of 72 with a MI. She has 12 brothers and  sisters in total. She has one sister she knows who has heart disease in her  17s. Her siblings she is unsure of.   REVIEW OF SYMPTOMS:  Remarkable for occasional headache, decreased vision  over the  past year for which she is followed by per primary care physician  and is being referred to an eye doctor. No nasal discharge. No bleeding. No  voice changes. No vertigo. No photophobia. No skin rashes or lesions.  Cardiopulmonary is per HPI. She reports shortness of breath and dyspnea on  exertion. She reports she has been sleeping on three pillows for the past  three months. Some mild lower extremity pedal edema. She does report  palpitations. She denies any syncope other than her admission in November of  2004. She reports coughing, wheezing. She denies any urinary symptoms.  Neuropsychiatric shows she denies any focal weakness. She reports depression  and anxiety which are controlled with her medications. She has a history of  joint swelling. GI shows she denies any nausea, vomiting, diarrhea. No  bright red blood per rectum. No melena. No hematemesis. She does have  gastroesophageal reflux disease for which she takes Protonix. She denies any  change in her bowel habit. All other systems are negative.   PHYSICAL EXAMINATION:  VITAL SIGNS:  Temperature 97.4, pulse 87, respiration  20, blood pressure 135/74. She is saturating 97% on two liters nasal  cannula.  HEENT:  Oropharynx is clear.  NECK:  Supple. She has no bruits. JVP is approximately 7 cm.  CARDIOVASCULAR:  Normal S1, split S2. Regular rate and rhythm. Her PMI is  nondisplaced. No murmurs appreciated.  PULSES:  Her pulses are 2+ throughout and without bruits.  LUNGS:  She has coarse breath sounds, minimal expiratory wheezing.  ABDOMEN:  Soft and nontender. Normal active bowel sounds. No organomegaly.  EXTREMITIES:  She has trace pedal edema.  NEUROLOGICAL:  Grossly nonfocal.   LABORATORY DATA:  Chest x-ray shows she has stable right lower lobe scar-  like opacity. No active disease. EKG shows rate of 97, normal rhythm, normal axis. PR interval is 200, QRS is 96. She has no ischemic changes. No Q-  waves. No hypertrophy. No  change from prior EKG from November of 2004.   Her hemoglobin is 14.3, hematocrit 42.  Potassium is 4.4, creatinine 0.9.  Point of care cardiac enzymes have a troponin of less than  0.05. On two  sets, her MB is less than 1.0. Myoglobin is normal.   ASSESSMENT/PLAN:  The patient is a 55 year old female with history of  nonobstructive coronary artery disease who now presents with progressive  angina and unstable angina.   PLAN:  1.  CAD:  The patient with unstable angina. We will admit her to telemetry,      cycle cardiac enzymes, and follow EKG. We will start medical management.      We will start aspirin, Lovenox, low dose beta blocker, and continue her      Statin. We will start her on nitroglycerin drip also. We encouraged      tobacco cessation. We will check a urine drug screen due to her prior      history and check a fasting lipid panel. We will plan for a repeat      catheterization tomorrow to assess for progression of her previous      disease. Her story is concerning for progressive angina over the past      several months and was indicative of unstable angina picture.  2.  Dyspnea on exertion:  The patient reports dyspnea for the past three      months. We will assess her EF at the time of her cardiac      catheterization. She had no significant valvular disease on a previous      echocardiogram. We will check a BNP today. Her chest x-ray does not look      like pulmonary edema. We will monitor her for now. We will consider      obtaining PFTs as her symptoms may be related to her tobacco abuse and      known diagnosis of asthma/COPD.  3.  Psychiatric:  The patient has multiple psychiatric disorders. She is on      an intense psychiatric regimen. We will continue her medications per her      home regimen.  4.  History of polysubstance abuse:  She reports complete cessation. She is      no longer followed in the methadone clinic. We will check a urine drug      screen  today.      CGF/MEDQ  D:  12/15/2004  T:  12/15/2004  Job:  [id number]

## 2010-12-12 NOTE — H&P (Signed)
[name], [name]         ACCOUNT NO.:  [id number]   MEDICAL RECORD NO.:  [id number]          PATIENT TYPE:  INP   LOCATION:                               FACILITY:  [facility]   PHYSICIAN:  Danae Chen, M.D.DATE OF BIRTH:  1956-01-25   DATE OF ADMISSION:  03/05/2005  DATE OF DISCHARGE:                                HISTORY & PHYSICAL   CHIEF COMPLAINT:  Suicidal ideation, benzodiazepine withdrawal symptoms.   PRIMARY CARE PHYSICIAN:  Dr. Rise Mu Mazzocchi   HISTORY OF PRESENT ILLNESS:  Patient is a 55 year old white female with a  history of depression and anxiety who is admitted for detoxification from  benzodiazepine addiction and possible withdrawal symptoms.  She reports that  over the last two weeks she has been increasing her use of Xanax with the  intent of harming herself.  She has been on Xanax for quite some time under  the supervision of her physician and psychiatrist but over the last couple  months to two weeks she has been increasingly despondent and had self-  induced increased her use of the Xanax.  However, over the last day or two  she had decided to come off of her Xanax but her psychiatrist has been out  of town.  She came to the emergency department for further evaluation and  for medical clearance for detoxification and for inpatient treatment.   PAST MEDICAL HISTORY:  1.  Coronary artery disease status post catheterization per Dr. Daisey Must, [facility].  2.  History of reflux disease.  3.  History of depression and insomnia.   PAST SURGICAL HISTORY:  1.  Status post hysterectomy.  2.  Status post cesarean section.  3.  Knee and foot surgery.  4.  Partial nephrectomy secondary to kidney stones.   SOCIAL HISTORY:  She does smoke.  Denies any alcohol use.  She is married.  Has four kids.  Does not work outside the home.   FAMILY HISTORY:  Significant for heart disease.  Is unsure of whether  depression or suicidal  ideation runs in her family.   REVIEW OF SYSTEMS:  No recent weight gain, weight loss.  No headaches,  dizziness, chest pain, nausea, or vomiting.  She has some tremors and some  agitation.   MEDICATIONS:  1.  Plavix 75 mg p.o. daily.  2.  Aspirin 325 mg p.o. daily.  3.  Cymbalta 30 mg p.o. daily.  4.  Zyprexa 5 mg p.o. daily.  5.  Rozerem 8 mg p.o. daily.  6.  Vytorin 10/80 p.o. daily.  7.  Protonix 40 mg p.o. daily.  8.  Xanax 8 mg p.o. daily.   ALLERGIES:  DESYREL.   PHYSICAL EXAMINATION:  VITAL SIGNS:  Blood pressure 110/72, temperature  97.9, pulse 82, O2 saturation 96% on room air.  GENERAL:  She is in no acute distress.  Husband at bedside.  Oropharynx is  clear.  NECK:  Supple.  No lymphadenopathy.  HEENT:  Pupils are dilated and sluggish.  Head is normocephalic, atraumatic.  Nares are patent.  LUNGS:  Clear to auscultation bilaterally.  HEART:  Rate is regular.  No murmurs are appreciated.  ABDOMEN:  Soft, nontender.  No rebound.  No guarding.  2+ femoral pulses.  2+ dorsalis pedis pulses.  EXTREMITIES:  No peripheral edema is noted.  SKIN:  Warm and dry.  NEUROLOGIC:  She is alert and oriented to person, place, and time, and  month.  5/5 adequate strength.  No focal neurologic deficits noted  otherwise.   LABORATORIES:  Sodium 135, potassium 3.6, BUN 14, glucose 112, creatinine 1.  Alcohol less than 5 mg/dl.  Urine drug screen positive only for opiates and  barbiturates and benzodiazepines.   IMPRESSION:  A 55 year old with suicidal ideation and benzodiazepine  addiction and possible withdrawal symptoms.  Patient is here for  detoxification and for placement inpatient psychiatric services.   PLAN:  We will admit her to a telemetry bed until her vital signs are stable  x24 hours.  Will place her on an Ativan withdrawal protocol.  Continue her  other home medications as prescribed.  Place her in one-on-one observation  for 24 hours and obtain psych consult for  placement in Behavioral Health  when she is medically cleared.      Danae Chen, M.D.  Electronically Signed     RLK/MEDQ  D:  03/05/2005  T:  03/05/2005  Job:  [id number]

## 2010-12-12 NOTE — Discharge Summary (Signed)
NAME:  [name], [name] NO.:  [id number]   MEDICAL RECORD NO.:  [id number]          PATIENT TYPE:  IPS   LOCATION:  0303                          FACILITY:  [facility]   PHYSICIAN:  Jeanice Lim, M.D. DATE OF BIRTH:  Oct 02, 1955   DATE OF ADMISSION:  03/08/2005  DATE OF DISCHARGE:  03/11/2005                                 DISCHARGE SUMMARY   IDENTIFYING DATA:  This is a 55 year old married Caucasian female,  voluntarily admitted and transferred from medicine unit, having suicidal  thoughts for one month, had counted out pills, working out overdose  strategy, over the past month increased Xanax from 8 mg to 15 mg using a  prescription, also on OxyContin 20 daily for 6 months, with suicidal  thoughts, irritable for 5 weeks.  Past psychiatric history:  First  [facility] admission.  Medications:  See detailed list.   ALLERGIES:  HYDROXACEN, DESYREL, ZOFRAN.   PHYSICAL AND NEUROLOGICAL EXAMINATION:  Within normal limits.   MENTAL STATUS EXAM:  Fully alert, pleasant and cooperative, anxious and  tearful, normal speech, decreased amount.  Mood depressed, affect  restricted, thought process positive for passive suicidal ideation,  cognitively intact.  Judgment and insight impaired.  The patient admitted to  having some agitated thoughts.   ADMISSION DIAGNOSES:  AXIS I:  Major depressive disorder, recurrent, severe,  and benzodiazepine dependence, opiate abuse.  AXIS II:  None.  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  AXIS IV:  Moderate to severe problems with stressors, including psychosocial  issues, limited support system.  AXIS V:  30/60.   HOSPITAL COURSE:  The patient was admitted and ordered routine p.r.n.  medications, underwent further monitoring, and was encouraged to participate  in individual, group and milieu therapy.  Nicotine patch was placed.  The  patient  was placed on Librium, detox protocol with Zyprexa was optimized.  Baseline hemoglobin A1C and lipid panel were obtained as indicated and the  patient was monitored medically.  The patient admitted that she had been  taking 15 mg Xanax a day and had a plan to take 50 mg of Xanax but then  called M.D.  The patient reported severe withdrawal symptoms, anxiety,  nausea, diarrhea, difficulty sleeping.  Agreed to family meeting with  husband to mobilize support system and identify treatment issues.  The  patient reported a positive response from detox and tolerated detox without  complications and was discharged in markedly improved condition following  contact with family for followup in Intensive Outpatient Program treatment,  discharged in improved condition with no dangerous ideation, no acute  evidence of withdrawal, given medication education and discharged on:  1.  Neurontin 300 mg q.8 p.m.  2.  Librium 25 mg t.i.d. for 10 days, then b.i.d. for 10 days, then 1 a day      for 10 days, then stop for physiologic and more gradual taper due to the      patient's history of extremely high, extended benzodiazepine dependence.  3.  Zyprexa 15 mg q.8 p.m.  4.  Aspirin 325  mg q.a.m.  5.  Rozerem 8 mg q.h.s.  6.  Protonix 40 mg q.a.m.  7.  Cymbalta 30 mg q.a.m.   DISPOSITION:  The patient will follow up with [facility].  Husband was to  administer and monitor medications, especially in regards to Librium.  The  patient was to not be placed on any benzodiazepines due to pattern, and the  patient was to follow up with Dr. Phillip Heal and Lorenda Cahill, appointment  August 30 at 2 p.m.   DISCHARGE DIAGNOSES:  AXIS I:  Major depressive disorder, recurrent, severe,  and benzodiazepine dependence, opiate abuse.  AXIS II:  None.  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  AXIS IV:  Moderate to severe problems with stressors,  including psychosocial  issues, limited support system.  AXIS V:  Global assessment of function on discharge was 55-60.      Jeanice Lim, M.D.  Electronically Signed     JEM/MEDQ  D:  04/11/2005  T:  04/12/2005  Job:  [id number]

## 2010-12-12 NOTE — Discharge Summary (Signed)
NAMEJALICIA, [name]         ACCOUNT NO.:  [id number]   MEDICAL RECORD NO.:  [id number]          PATIENT TYPE:  OBV   LOCATION:  3028                         FACILITY:  [facility]   PHYSICIAN:  Theodosia Paling, MD    DATE OF BIRTH:  1956-07-21   DATE OF ADMISSION:  09/26/2008  DATE OF DISCHARGE:  09/30/2008                               DISCHARGE SUMMARY   DISCHARGE DIAGNOSES:  1. Chronic obstructive pulmonary disease exacerbation.  2. Diabetes mellitus.  3. Coronary artery disease.  4. Anemia.  5. Thrombocytopenia.   DISCHARGE MEDICATIONS:  The patient is to go home on following  medications   Dictation ended at this point.      Theodosia Paling, MD  Electronically Signed     NP/MEDQ  D:  12/06/2008  T:  12/07/2008  Job:  [id number]

## 2010-12-12 NOTE — Assessment & Plan Note (Signed)
[facility]                            CARDIOLOGY OFFICE NOTE   NAME:[name], [name]                MRN:          [id number]  DATE:08/05/2006                            DOB:          11-04-1955    PRIMARY CARE PHYSICIAN:  Talmadge Coventry, M.D.   REASON FOR VISIT:  Routine cardiac followup.   HISTORY OF PRESENT ILLNESS:  I saw Ms. Witucki back in July.  Her  history is outlined in my previous note and includes a recent cardiac  catheterization from July showing a widely patent left anterior  descending stent.  She mentions a few complaints today, one being weight  gain which she has noticed since Thanksgiving, reportedly without any  marked increase in oral intake.  Her weight is noted to be up today from  the 170s to the 207 range.  She has experienced some general discomfort  and bloating in her abdomen as well as some lower extremity edema, but  denies any frank problems with indigestion or dysphagia and states that  her bowel habits are normal.  Since Sunday she has felt more breathless  and has had a new indigestion-like chest pain/pressure.  She reports  being compliant with her medications.  Her electrocardiogram today is  stable without any marked changes compared to the prior tracing from  April 2007.  She denies being on any new medications.  She apparently  had a urinary tract infection in the interim since I saw her.   ALLERGIES:  1. VANTIN.  2. ERYTHROMYCIN.  3. DESYREL.   PRESENT MEDICATIONS:  1. Enteric-coated aspirin 325 mg p.o. daily.  2. Plavix 75 mg p.o. daily.  3. Cymbalta 60 mg p.o. daily.  4. Zyprexa 20 mg p.o. q.h.s.  5. Vytorin 10/80 mg p.o. daily.  6. Toprol-XL 25 mg p.o. daily.  7. Multivitamin once daily.  8. Protonix 40 mg p.o. b.i.d.   REVIEW OF SYSTEMS:  Is as outlined above, otherwise negative.   EXAMINATION:  VITAL SIGNS:  Blood pressure today is 163/92, heart rate  is 92.  Weight is 207  pounds, up from 178 in July.  GENERAL:  She is not in any acute distress at this time.  HEENT:  Conjunctivae and lids normal, oropharynx is clear.  NECK:  Supple without elevated jugular venous pressure or loud carotid  bruits.  No thyromegaly is noted.  LUNGS:  Clear with no obvious rales.  There is some end-expiratory  wheeze noted, although mild, and respiratory effort is nonlabored.  CARDIAC:  Reveals a regular rate and rhythm without S3 gallop or  pericardial rub.  ABDOMEN:  Is somewhat protuberant, unable to adequately palpate liver  edge.  No tenderness or guarding noted.  Bowel sounds are present.  EXTREMITIES:  Exhibit 1+ edema below the knees bilaterally, otherwise  warm and dry.  No ulcerative changes.  MUSCULOSKELETAL:  No kyphosis is noted.   IMPRESSION/RECOMMENDATION:  1. Recent increase in dyspnea with some recurrent indigestion-like      chest pain.  The patient's history is complicated by both known      coronary artery disease as well as gastroesophageal  disease status      post previous dilatation.  She is not reporting dysphagia at this      time, however.  Her electrocardiogram shows no acute changes.  She      describes her symptoms sometimes as being pressure-like.  We will      therefore proceed with a 2-D echocardiogram to reassess left      ventricular systolic function, particularly given element of edema      and increased weight, and also follow up with an adenosine Myoview      on medical therapy to reassess for left anterior descending      ischemia.  I will have her follow up in the office to discuss      these.  In the meanwhile, I will increase her Toprol-XL to 50 mg      daily.  2. Increase in weight with general abdominal discomfort and bloating.      An abdominal ultrasound will be scheduled.  Will also obtain liver      function tests as part of her routine lipid surveillance, which      will also be obtained.  3. Hyperlipidemia, on Vytorin 10/80  mg p.o. daily.  Lipids will be      obtained.  4. Hypertension.  5. Known history of esophagitis, duodenitis, and esophageal stricture      status post dilatation.     Jonelle Sidle, MD  Electronically Signed    SGM/MedQ  DD: 08/05/2006  DT: 08/05/2006  Job #: [id number]   cc:   Talmadge Coventry, M.D.

## 2010-12-12 NOTE — Cardiovascular Report (Signed)
NAMEKETA, VANVALKENBURGH         ACCOUNT NO.:  [id number]   MEDICAL RECORD NO.:  [id number]          PATIENT TYPE:  INP   LOCATION:  2001                         FACILITY:  [facility]   PHYSICIAN:  Charlton Haws, M.D.     DATE OF BIRTH:  [birth year]   DATE OF PROCEDURE:  01/20/2006  DATE OF DISCHARGE:                              CARDIAC CATHETERIZATION   CORONARY ARTERIOGRAPHY:  Previous stent to the LAD in March of 2007, recurrent chest pain relieved by  nitro.   Cine catheterization done from the right femoral artery.  At the end of the  case nonselective iliac angiography showed [place] to be in good position for  AngioSeal.   We had excellent hemostasis at the end of the case.   Left main coronary artery was normal.   Proximal LAD had a 30% tubular stenosis.   The mid vessel stent was widely patent.  At one point in the shallow RAO  view there appeared to be diffuse stenosis in the stent.  However, this  turned out to be an overlapping large diagonal vessel.  This was determined  after switching out for a 3.5 guide catheter and repeating the injection  with IC nitro.   The circumflex coronary artery was nondominant and normal.   Right coronary artery was dominant and normal.   RAO VENTRICULOGRAPHY:  RAO ventriculography was normal.  EF was 60%.  There  was no gradient across the aortic valve and no MR.  Aortic pressure was  112/66.  LV pressure was 110/8.   The patient stent is widely patent.  She does not have any new lesions.  She  will be watched overnight and probably discharged in the morning.  To  continue medical therapy including aspirin and Plavix.           ______________________________  Charlton Haws, M.D.     PN/MEDQ  D:  01/20/2006  T:  01/20/2006  Job:  [id number]

## 2010-12-17 ENCOUNTER — Other Ambulatory Visit (INDEPENDENT_AMBULATORY_CARE_PROVIDER_SITE_OTHER): Payer: PRIVATE HEALTH INSURANCE | Admitting: *Deleted

## 2010-12-17 DIAGNOSIS — E785 Hyperlipidemia, unspecified: Secondary | ICD-10-CM

## 2010-12-17 DIAGNOSIS — I2510 Atherosclerotic heart disease of native coronary artery without angina pectoris: Secondary | ICD-10-CM

## 2010-12-17 DIAGNOSIS — I259 Chronic ischemic heart disease, unspecified: Secondary | ICD-10-CM

## 2010-12-17 LAB — HEPATIC FUNCTION PANEL
ALT: 13 U/L (ref 0–35)
Bilirubin, Direct: 0.1 mg/dL (ref 0.0–0.3)
Total Bilirubin: 0.6 mg/dL (ref 0.3–1.2)

## 2010-12-17 LAB — BASIC METABOLIC PANEL
BUN: 23 mg/dL (ref 6–23)
CO2: 32 mEq/L (ref 19–32)
Chloride: 101 mEq/L (ref 96–112)
Glucose, Bld: 146 mg/dL — ABNORMAL HIGH (ref 70–99)
Potassium: 3.3 mEq/L — ABNORMAL LOW (ref 3.5–5.1)
Sodium: 142 mEq/L (ref 135–145)

## 2010-12-17 LAB — LIPID PANEL
LDL Cholesterol: 55 mg/dL (ref 0–99)
Total CHOL/HDL Ratio: 4
VLDL: 37.2 mg/dL (ref 0.0–40.0)

## 2010-12-19 NOTE — Telephone Encounter (Signed)
Patient wants to know if she needs to come to the office next week, to re-check  Potassium level, after increasing  Potassium 10 mEq, to  4 tabs a day.

## 2010-12-19 NOTE — Telephone Encounter (Signed)
Should patient come back in the office to have blood work drawn.

## 2010-12-29 NOTE — Telephone Encounter (Signed)
I talked with pt an confirmed she increased KCL to four 10 mEq daily.

## 2011-01-02 ENCOUNTER — Encounter: Payer: Self-pay | Admitting: *Deleted

## 2011-01-05 ENCOUNTER — Encounter: Payer: Self-pay | Admitting: Internal Medicine

## 2011-01-05 ENCOUNTER — Ambulatory Visit (INDEPENDENT_AMBULATORY_CARE_PROVIDER_SITE_OTHER): Payer: PRIVATE HEALTH INSURANCE | Admitting: Internal Medicine

## 2011-01-05 VITALS — BP 134/78 | HR 88 | Ht 66.0 in | Wt 249.0 lb

## 2011-01-05 DIAGNOSIS — K222 Esophageal obstruction: Secondary | ICD-10-CM

## 2011-01-05 DIAGNOSIS — Z1211 Encounter for screening for malignant neoplasm of colon: Secondary | ICD-10-CM

## 2011-01-05 DIAGNOSIS — E119 Type 2 diabetes mellitus without complications: Secondary | ICD-10-CM

## 2011-01-05 DIAGNOSIS — R1310 Dysphagia, unspecified: Secondary | ICD-10-CM

## 2011-01-05 DIAGNOSIS — K219 Gastro-esophageal reflux disease without esophagitis: Secondary | ICD-10-CM

## 2011-01-05 MED ORDER — PEG-KCL-NACL-NASULF-NA ASC-C 100 G PO SOLR: 1.0000 | Freq: Once | ORAL | Status: DC

## 2011-01-05 NOTE — Patient Instructions (Signed)
Colon/Endo with Dil and Propoful 02/10/11 8:30 am arrive at 7:30 am on 4th floor. Moviprep prescription sent to your pharmacy Colon/endo brochures given for you to review.

## 2011-01-05 NOTE — Progress Notes (Signed)
HISTORY OF PRESENT ILLNESS:  [name] is a 55 y.o. female with multiple significant medical problems including coronary artery disease status post coronary artery stenting of the LAD, COPD with long-standing tobacco abuse, obesity, hypertension, hyperlipidemia, and multiple psychiatric diagnoses. She has been seen in the esophagus principally for GERD complicated by peptic stricture. She has not been seen in years. Last endoscopy with dilation appears to have been August 2007. No prior colonoscopy. Previously on Plavix, but no longer period does not have a primary provider at this time.. Principle reason for the visit today is recurrent dysphagia in the need for PP prescription. She had been taking Nexium. More recently H2 receptor antagonist with significant breakthrough. Currently complains of regurgitation and pyrosis. Intermittent solid food dysphagia several months duration. Also complains of weight gain and bloating. GI review of systems is otherwise negative. Takes metformin at night for her diabetes.  REVIEW OF SYSTEMS:  All non-GI ROS negative except for Sleeping problems  Past Medical History  Diagnosis Date  . Anemia, unspecified   . Major depressive disorder, single episode, unspecified   . Other, mixed, or unspecified nondependent drug abuse, unspecified   . Bipolar disorder, unspecified   . Dissociative identity disorder   . Unspecified essential hypertension   . Other and unspecified hyperlipidemia   . Esophageal reflux   . Chronic airway obstruction, not elsewhere classified   . Coronary atherosclerosis of unspecified type of vessel, native or graft   . Diabetes mellitus   . Hiatal hernia     Past Surgical History  Procedure Date  . Cesarean section   . Bladder surgery     Tack  . Nephrectomy     Part of left kidney  . Tonsillectomy and adenoidectomy     Social History [name]  reports that she quit smoking about 5 months ago. Her smoking  use included Cigarettes. She quit after 40 years of use. She has never used smokeless tobacco. She reports that she does not drink alcohol or use illicit drugs.  family history includes Emphysema in her mother; Heart attack (age of onset:30) in her brother and sister; Heart disease in her father; and Ovarian cancer in her mother.  There is no history of Colon cancer.  Allergies  Allergen Reactions  . Cefpodoxime Proxetil   . Cephalexin     REACTION: unspecified  . Erythromycin Ethylsuccinate     REACTION: unspecified  . Trazodone Hcl     REACTION: unspecified       PHYSICAL EXAMINATION: Vital signs: BP 134/78  Pulse 88  Ht 5\' 6"  (1.676 m)  Wt 249 lb (112.946 kg)  BMI 40.19 kg/m2  Constitutional:  obese and unhealthy -appearing, no acute distress Psychiatric: alert and oriented x3, cooperative Eyes: extraocular movements intact, anicteric, conjunctiva pink Mouth: oral pharynx moist, no lesions Neck: supple, thick,  no lymphadenopathy Cardiovascular: heart regular rate and rhythm, no murmur Lungs: clear to auscultation bilaterally Abdomen: soft,obese,  nontender, nondistended, no obvious ascites, no peritoneal signs, normal bowel sounds, no organomegaly Rectal:Deferred until colonoscopy Extremities: no lower extremity edema bilaterally Skin: no lesions on visible extremities Neuro: No focal deficits. No asterixis.   ASSESSMENT:  #1. GERD. Significant breakthrough on H2 receptor antagonist therapy #2. Recurrent dysphagia in a patient with known peptic stricture requiring dilation previously #3. Multiple significant comorbidities #4. Screening colonoscopy. Appropriate candidate without contraindication though higher than baseline risk due to her comorbidities. She is interested in screening colonoscopy  PLAN:  #1. Refill Nexium  40 mg daily #2. Reflux precautions with attention to weight loss #3. Upper endoscopy with esophageal dilation.The nature of the procedure, as well  as the risks, benefits, and alternatives were carefully and thoroughly reviewed with the patient. Ample time for discussion and questions allowed. The patient understood, was satisfied, and agreed to proceed.  #4. Screening colonoscopy.The nature of the procedure, as well as the risks, benefits, and alternatives were carefully and thoroughly reviewed with the patient. Ample time for discussion and questions allowed. The patient understood, was satisfied, and agreed to proceed. Movi prep prescribed. The patient instructed on its use. #5. Because of her obesity, COPD, and psychiatric issues, I feel that her sedation would be safest via propofol and MAC/CRNA supervision

## 2011-01-06 ENCOUNTER — Other Ambulatory Visit: Payer: Self-pay | Admitting: Internal Medicine

## 2011-01-07 ENCOUNTER — Telehealth: Payer: Self-pay | Admitting: Internal Medicine

## 2011-01-07 NOTE — Telephone Encounter (Signed)
Pt states that she is nervous about the colon endo. Reassured pt that she will be taken care of well. Instructed pt to be sure and follow the prep as instructed and things should go smoothly. Pt verbalized understanding.

## 2011-01-16 ENCOUNTER — Telehealth: Payer: Self-pay | Admitting: Cardiology

## 2011-01-16 NOTE — Telephone Encounter (Signed)
01/16/11--spoke with dr Shirlee Latch and dicussed pt's request for increase in lasix--per dr Clancy Gourd pt increase K to qd and increase lasix to 40mg  twice daily--pt should divide doses taking 1/2 of each in am and pm--then come in for blood work in 10 days--pt agrees--appoint scheduled for lab on 01/27/11

## 2011-01-16 NOTE — Telephone Encounter (Signed)
Pt called wanting to reduce lasix-pls call has questions

## 2011-01-16 NOTE — Telephone Encounter (Signed)
[name]--ms Sapien calling stating since her lasix was reduced to 40mg  qd, she is now experiencing swelling of ankles etc. --she wants to take her old dose of 80mg  qd--advised her to take a 40mg  tab of lasix now(she took one already today-40mg ) and i would speak to dr  Shirlee Latch and call back later if he wants any changes--pt agrees--nt

## 2011-01-26 ENCOUNTER — Other Ambulatory Visit: Payer: Self-pay | Admitting: Internal Medicine

## 2011-01-26 ENCOUNTER — Telehealth: Payer: Self-pay

## 2011-01-26 ENCOUNTER — Encounter: Payer: Self-pay | Admitting: Internal Medicine

## 2011-01-26 ENCOUNTER — Ambulatory Visit (HOSPITAL_COMMUNITY)
Admission: RE | Admit: 2011-01-26 | Discharge: 2011-01-26 | Disposition: A | Discharge status: Home / Self Care | Payer: PRIVATE HEALTH INSURANCE | Source: Ambulatory Visit | Attending: Internal Medicine | Admitting: Internal Medicine

## 2011-01-26 ENCOUNTER — Ambulatory Visit (AMBULATORY_SURGERY_CENTER): Payer: PRIVATE HEALTH INSURANCE | Admitting: Internal Medicine

## 2011-01-26 DIAGNOSIS — K639 Disease of intestine, unspecified: Secondary | ICD-10-CM

## 2011-01-26 DIAGNOSIS — R1310 Dysphagia, unspecified: Secondary | ICD-10-CM

## 2011-01-26 DIAGNOSIS — K222 Esophageal obstruction: Secondary | ICD-10-CM

## 2011-01-26 DIAGNOSIS — R1031 Right lower quadrant pain: Secondary | ICD-10-CM | POA: Insufficient documentation

## 2011-01-26 DIAGNOSIS — K219 Gastro-esophageal reflux disease without esophagitis: Secondary | ICD-10-CM

## 2011-01-26 DIAGNOSIS — Z1211 Encounter for screening for malignant neoplasm of colon: Secondary | ICD-10-CM

## 2011-01-26 DIAGNOSIS — K921 Melena: Secondary | ICD-10-CM | POA: Insufficient documentation

## 2011-01-26 LAB — GLUCOSE, CAPILLARY
Glucose-Capillary: 112 mg/dL — ABNORMAL HIGH (ref 70–99)
Glucose-Capillary: 104 mg/dL — ABNORMAL HIGH (ref 70–99)

## 2011-01-26 MED ORDER — SODIUM CHLORIDE 0.9 % IV SOLN: 500.0000 mL | INTRAVENOUS | Status: DC

## 2011-01-26 NOTE — Telephone Encounter (Signed)
Pt aware of results per Dr. Perry. 

## 2011-01-26 NOTE — Patient Instructions (Signed)
Discharge instructions given with verbal understanding. Handout on dilatation diet given. Resume previous medications.

## 2011-01-26 NOTE — Telephone Encounter (Signed)
Message copied by Michele Mcalpine on Mon Jan 26, 2011  4:07 PM ------      Message from: Hilarie Fredrickson      Created: Mon Jan 26, 2011  3:38 PM       Let pt know that BE was negative. Needs recall colon for 5 years

## 2011-01-27 ENCOUNTER — Other Ambulatory Visit (INDEPENDENT_AMBULATORY_CARE_PROVIDER_SITE_OTHER): Payer: PRIVATE HEALTH INSURANCE | Admitting: *Deleted

## 2011-01-27 ENCOUNTER — Telehealth: Payer: Self-pay

## 2011-01-27 DIAGNOSIS — R002 Palpitations: Secondary | ICD-10-CM

## 2011-01-27 DIAGNOSIS — E785 Hyperlipidemia, unspecified: Secondary | ICD-10-CM

## 2011-01-27 DIAGNOSIS — I10 Essential (primary) hypertension: Secondary | ICD-10-CM

## 2011-01-27 DIAGNOSIS — I2510 Atherosclerotic heart disease of native coronary artery without angina pectoris: Secondary | ICD-10-CM

## 2011-01-27 DIAGNOSIS — R0602 Shortness of breath: Secondary | ICD-10-CM

## 2011-01-27 LAB — BASIC METABOLIC PANEL
CO2: 34 mEq/L — ABNORMAL HIGH (ref 19–32)
Calcium: 9 mg/dL (ref 8.4–10.5)
Creatinine, Ser: 1.2 mg/dL (ref 0.4–1.2)
Glucose, Bld: 90 mg/dL (ref 70–99)

## 2011-01-27 NOTE — Telephone Encounter (Signed)

## 2011-01-29 ENCOUNTER — Telehealth: Payer: Self-pay | Admitting: Cardiology

## 2011-01-29 NOTE — Telephone Encounter (Signed)
Patient aware of lab results.

## 2011-01-29 NOTE — Telephone Encounter (Signed)
Pt calling for lab results from tuesday

## 2011-02-06 ENCOUNTER — Other Ambulatory Visit: Payer: Self-pay | Admitting: Internal Medicine

## 2011-02-10 ENCOUNTER — Encounter: Payer: PRIVATE HEALTH INSURANCE | Admitting: Internal Medicine

## 2011-03-27 ENCOUNTER — Telehealth: Payer: Self-pay | Admitting: Cardiology

## 2011-03-27 NOTE — Telephone Encounter (Signed)
After receiving a ROI/Request for medical records, I placed patients records in an addressed envelope and left it to go with the [place] courier.

## 2011-04-01 ENCOUNTER — Other Ambulatory Visit: Payer: Self-pay | Admitting: Internal Medicine

## 2011-04-20 LAB — BASIC METABOLIC PANEL
BUN: 13
CO2: 31
Chloride: 104
Glucose, Bld: 95
Potassium: 3.8
Sodium: 141

## 2011-04-20 LAB — I-STAT 8, (EC8 V) (CONVERTED LAB)
BUN: 12
Bicarbonate: 25.6 — ABNORMAL HIGH
Glucose, Bld: 92
pCO2, Ven: 36.1 — ABNORMAL LOW

## 2011-04-20 LAB — LIPID PANEL
Cholesterol: 149
LDL Cholesterol: 84
Total CHOL/HDL Ratio: 5.5

## 2011-04-20 LAB — COMPREHENSIVE METABOLIC PANEL
AST: 18
Alkaline Phosphatase: 52
BUN: 11
CO2: 29
Chloride: 106
Creatinine, Ser: 0.85
GFR calc Af Amer: >60
GFR calc non Af Amer: >60
Potassium: 3.7
Total Bilirubin: 0.6

## 2011-04-20 LAB — B-NATRIURETIC PEPTIDE (CONVERTED LAB): Pro B Natriuretic peptide (BNP): 76

## 2011-04-20 LAB — CBC
MCHC: 34.2
Platelets: 248
RDW: 13.3

## 2011-04-20 LAB — POCT CARDIAC MARKERS
CKMB, poc: <1 — ABNORMAL LOW
Myoglobin, poc: 54.6
Operator id: 234501
Operator id: 294521
Troponin i, poc: <0.05

## 2011-04-20 LAB — HEMOGLOBIN A1C: Mean Plasma Glucose: 140

## 2011-04-20 LAB — POCT I-STAT CREATININE: Creatinine, Ser: 0.9

## 2011-04-20 LAB — TROPONIN I: Troponin I: 0.01

## 2011-04-24 LAB — TSH: TSH: 1.445

## 2011-04-24 LAB — DIFFERENTIAL
Basophils Relative: 0
Eosinophils Absolute: 0.1
Lymphs Abs: 2.6
Monocytes Absolute: 0.5
Monocytes Relative: 7

## 2011-04-24 LAB — CBC
HCT: 44.8
Hemoglobin: 14.9
MCHC: 33.4
MCV: 98.9
RBC: 4.53
WBC: 8

## 2011-04-24 LAB — CK TOTAL AND CKMB (NOT AT ARMC)
CK, MB: 0.9
Total CK: 57

## 2011-04-24 LAB — COMPREHENSIVE METABOLIC PANEL
Albumin: 3.8
Alkaline Phosphatase: 58
BUN: 13
Calcium: 9.5
Potassium: 3.6
Total Protein: 6.7

## 2011-04-24 LAB — PROTIME-INR: INR: 0.9

## 2011-05-11 LAB — LIPID PANEL
Cholesterol: 119
HDL: 32 — ABNORMAL LOW
LDL Cholesterol: 53
Total CHOL/HDL Ratio: 3.7
Triglycerides: 168 — ABNORMAL HIGH

## 2011-05-11 LAB — CBC
HCT: 40.5
Hemoglobin: 14
MCHC: 34.4
MCV: 95.6
MCV: 95.6
Platelets: 219
RBC: 3.77 — ABNORMAL LOW
RBC: 4.24
WBC: 10.4

## 2011-05-11 LAB — COMPREHENSIVE METABOLIC PANEL
ALT: 19
AST: 18
Albumin: 2.8 — ABNORMAL LOW
CO2: 30
Calcium: 8.6
Creatinine, Ser: 0.93
GFR calc Af Amer: >60
Sodium: 140
Total Protein: 4.5 — ABNORMAL LOW

## 2011-05-11 LAB — DIFFERENTIAL
Eosinophils Absolute: 0.1
Eosinophils Relative: 1
Lymphocytes Relative: 20
Lymphs Abs: 2.1
Monocytes Relative: 4

## 2011-05-11 LAB — URINALYSIS, ROUTINE W REFLEX MICROSCOPIC
Bilirubin Urine: NEGATIVE
Bilirubin Urine: NEGATIVE
Glucose, UA: NEGATIVE
Glucose, UA: NEGATIVE
Hgb urine dipstick: NEGATIVE
Ketones, ur: NEGATIVE
Ketones, ur: NEGATIVE
Nitrite: NEGATIVE
Protein, ur: NEGATIVE
Protein, ur: NEGATIVE
Urobilinogen, UA: 0.2

## 2011-05-11 LAB — CARDIAC PANEL(CRET KIN+CKTOT+MB+TROPI)
CK, MB: 1.1
CK, MB: 1.2
Relative Index: INVALID
Relative Index: INVALID
Total CK: 51
Troponin I: 0.01
Troponin I: 0.01
Troponin I: <0.01

## 2011-05-11 LAB — BASIC METABOLIC PANEL
BUN: 15
Chloride: 108
GFR calc Af Amer: >60
Potassium: 4.3
Sodium: 141

## 2011-05-11 LAB — POCT CARDIAC MARKERS: Troponin i, poc: <0.05

## 2011-05-11 LAB — MAGNESIUM: Magnesium: 1.7

## 2011-05-11 LAB — URINE CULTURE: Colony Count: 15000

## 2011-05-18 ENCOUNTER — Observation Stay (HOSPITAL_COMMUNITY)
Admission: EM | Admit: 2011-05-18 | Discharge: 2011-05-19 | DRG: 313 | Disposition: A | Discharge status: Home / Self Care | Payer: PRIVATE HEALTH INSURANCE | Attending: Cardiology | Admitting: Cardiology

## 2011-05-18 ENCOUNTER — Telehealth: Payer: Self-pay | Admitting: Cardiology

## 2011-05-18 ENCOUNTER — Emergency Department (HOSPITAL_COMMUNITY): Payer: PRIVATE HEALTH INSURANCE

## 2011-05-18 DIAGNOSIS — E669 Obesity, unspecified: Secondary | ICD-10-CM | POA: Insufficient documentation

## 2011-05-18 DIAGNOSIS — I252 Old myocardial infarction: Secondary | ICD-10-CM | POA: Insufficient documentation

## 2011-05-18 DIAGNOSIS — J4489 Other specified chronic obstructive pulmonary disease: Secondary | ICD-10-CM | POA: Insufficient documentation

## 2011-05-18 DIAGNOSIS — R079 Chest pain, unspecified: Principal | ICD-10-CM | POA: Insufficient documentation

## 2011-05-18 DIAGNOSIS — D649 Anemia, unspecified: Secondary | ICD-10-CM | POA: Insufficient documentation

## 2011-05-18 DIAGNOSIS — I2510 Atherosclerotic heart disease of native coronary artery without angina pectoris: Secondary | ICD-10-CM | POA: Insufficient documentation

## 2011-05-18 DIAGNOSIS — R0602 Shortness of breath: Secondary | ICD-10-CM

## 2011-05-18 DIAGNOSIS — Z9861 Coronary angioplasty status: Secondary | ICD-10-CM | POA: Insufficient documentation

## 2011-05-18 DIAGNOSIS — E119 Type 2 diabetes mellitus without complications: Secondary | ICD-10-CM | POA: Insufficient documentation

## 2011-05-18 DIAGNOSIS — J449 Chronic obstructive pulmonary disease, unspecified: Secondary | ICD-10-CM | POA: Insufficient documentation

## 2011-05-18 LAB — COMPREHENSIVE METABOLIC PANEL WITH GFR
ALT: 12 U/L (ref 0–35)
AST: 14 U/L (ref 0–37)
Albumin: 3.2 g/dL — ABNORMAL LOW (ref 3.5–5.2)
Alkaline Phosphatase: 73 U/L (ref 39–117)
BUN: 16 mg/dL (ref 6–23)
CO2: 28 meq/L (ref 19–32)
Calcium: 9.4 mg/dL (ref 8.4–10.5)
Chloride: 104 meq/L (ref 96–112)
Creatinine, Ser: 1.16 mg/dL — ABNORMAL HIGH (ref 0.50–1.10)
GFR calc Af Amer: 60 mL/min — ABNORMAL LOW
GFR calc non Af Amer: 52 mL/min — ABNORMAL LOW
Glucose, Bld: 114 mg/dL — ABNORMAL HIGH (ref 70–99)
Potassium: 3.4 meq/L — ABNORMAL LOW (ref 3.5–5.1)
Sodium: 144 meq/L (ref 135–145)
Total Bilirubin: 0.4 mg/dL (ref 0.3–1.2)
Total Protein: 6.1 g/dL (ref 6.0–8.3)

## 2011-05-18 LAB — POCT I-STAT, CHEM 8
BUN: 16 mg/dL (ref 6–23)
Hemoglobin: 14.3 g/dL (ref 12.0–15.0)
Potassium: 3.4 mEq/L — ABNORMAL LOW (ref 3.5–5.1)
Sodium: 140 mEq/L (ref 135–145)
TCO2: 28 mmol/L (ref 0–100)

## 2011-05-18 LAB — PROTIME-INR
INR: 1 (ref 0.00–1.49)
Prothrombin Time: 13.4 seconds (ref 11.6–15.2)

## 2011-05-18 LAB — GLUCOSE, CAPILLARY: Glucose-Capillary: 132 mg/dL — ABNORMAL HIGH (ref 70–99)

## 2011-05-18 LAB — CK TOTAL AND CKMB (NOT AT ARMC)
CK, MB: 2.3 ng/mL (ref 0.3–4.0)
Relative Index: INVALID (ref 0.0–2.5)
Total CK: 69 U/L (ref 7–177)

## 2011-05-18 LAB — CBC
Hemoglobin: 14.2 g/dL (ref 12.0–15.0)
MCHC: 33.8 g/dL (ref 30.0–36.0)
RDW: 13.2 % (ref 11.5–15.5)
WBC: 8.7 10*3/uL (ref 4.0–10.5)

## 2011-05-18 LAB — HEPARIN LEVEL (UNFRACTIONATED): Heparin Unfractionated: 0.2 IU/mL — ABNORMAL LOW (ref 0.30–0.70)

## 2011-05-18 LAB — POCT I-STAT TROPONIN I: Troponin i, poc: 0.01 ng/mL (ref 0.00–0.08)

## 2011-05-18 LAB — APTT: aPTT: 65 seconds — ABNORMAL HIGH (ref 24–37)

## 2011-05-18 NOTE — Telephone Encounter (Signed)
Pt calling to report heart palpitations, chest pressure, sob and a rapid irregular heart rate steadily since 9:30am today.  I recommended that she call 911 and go to the ER to be evaluated.

## 2011-05-18 NOTE — Telephone Encounter (Signed)
Pt called. She said she is having palpitations and pressure in her chest please call her back

## 2011-05-18 NOTE — Telephone Encounter (Signed)
Pt was talking and call was disconnected.  Please call her back

## 2011-05-19 LAB — LIPID PANEL
Cholesterol: 154 mg/dL (ref 0–200)
Total CHOL/HDL Ratio: 4.3 RATIO
Triglycerides: 154 mg/dL — ABNORMAL HIGH (ref <150)
VLDL: 31 mg/dL (ref 0–40)

## 2011-05-19 LAB — CARDIAC PANEL(CRET KIN+CKTOT+MB+TROPI)
CK, MB: 2.7 ng/mL (ref 0.3–4.0)
CK, MB: 2.7 ng/mL (ref 0.3–4.0)
Total CK: 77 U/L (ref 7–177)

## 2011-05-19 LAB — CBC
MCH: 33.1 pg (ref 26.0–34.0)
MCV: 100.6 fL — ABNORMAL HIGH (ref 78.0–100.0)
Platelets: 198 10*3/uL (ref 150–400)
RDW: 13.3 % (ref 11.5–15.5)

## 2011-05-20 LAB — HEMOGLOBIN A1C: Mean Plasma Glucose: 128 mg/dL — ABNORMAL HIGH (ref <117)

## 2011-05-27 ENCOUNTER — Encounter (INDEPENDENT_AMBULATORY_CARE_PROVIDER_SITE_OTHER): Payer: PRIVATE HEALTH INSURANCE

## 2011-05-27 DIAGNOSIS — R002 Palpitations: Secondary | ICD-10-CM

## 2011-05-28 ENCOUNTER — Other Ambulatory Visit: Payer: Self-pay | Admitting: Internal Medicine

## 2011-05-28 NOTE — Discharge Summary (Signed)
[name], [name]         ACCOUNT NO.:  [id number]  MEDICAL RECORD NO.:  [id number]  LOCATION:  2008                         FACILITY:  [facility]  PHYSICIAN:  Verne Carrow, MDDATE OF BIRTH:  Jul 06, 1956  DATE OF ADMISSION:  05/18/2011 DATE OF DISCHARGE:  05/19/2011                              DISCHARGE SUMMARY   PROCEDURE:  Two-view chest x-ray.  PRIMARY FINAL DISCHARGE DIAGNOSES: 1. Chest pain with shortness of breath, cardiac enzymes negative for     myocardial infarction, an outpatient Myoview planned. 2. Status post non ST-segment elevation myocardial infarction in 2004     with catheterization at that time showing noncritical disease. 3. Status post catheterization in 2007 with a Cypher stent to the left     anterior descending artery. 4. Status post cardiac catheterization in January 2012 showing     nonobstructive coronary artery disease and an ejection fraction of     65%. 5. Diabetes. 6. Anemia with a hemoglobin of 11.4 and hematocrit of 34.6 at     discharge. 7. History of thrombocytopenia, platelets within normal limits this     admission. 8. Chronic obstructive pulmonary disease and asthma. 9. Hypertension. 10.Hyperlipidemia. 11.History of tobacco use, quit in December 2011. 12.History of esophagitis/duodenitis/esophageal stricture, status post     EGD and dilatation in 2007. 13.History of bipolar disorder. 14.History of opioid abuse with detox in 2007. 15.Status post hysterectomy, left renal cystectomy for benign cysts, C-     section and carpal tunnel surgery. 16.Allergy or intolerance to TRAZODONE, ERYTHROMYCIN and VANTIN, but     tolerates Rocephin. 17.History of bradycardia when on beta-blockers and clonidine. 18.History of a right lower lobe nodules, stable by chest x-ray in     2009. 19.Obesity. 20.Family history of coronary artery disease in her father and one of     her siblings.  TIME AT DISCHARGE:  Thirty six minutes.  HOSPITAL  COURSE:  [name] is a 55 year old female with a history of coronary artery disease.  She came to the hospital with chest pain and was admitted for further evaluation and treatment.  Her cardiac enzymes were negative for MI.  A lipid profile showed slightly elevated triglycerides at 154 and an HDL at 36, but her LDL was 87.  She had mild elevation in her blood sugars, but none were over 150.  Her creatinine was slightly elevated at 1.16 with a BUN of 16 and a GFR of 52.  Her hemoglobin was normal on admission at 14.2, but at discharge, she was mildly anemic with a hemoglobin of 11.4 and hematocrit of 34.6.  MCV was 100.6.  This can be followed as an outpatient.  She had some palpitations, but no critical arrhythmias were seen on telemetry.  On May 19, 2011, she was evaluated by Dr. Clifton James.  He felt that she was stable for discharge, to follow up as an outpatient after a Holter monitor.  Of note, her potassium was slightly low at 3.4, and was supplemented prior to discharge.  DISCHARGE INSTRUCTIONS: 1. She is encouraged to stick to a low-sodium, heart-healthy, diabetic     diet. 2. She is to follow up with Dr. Shirlee Latch after her Holter monitor. 3. She is to get a  monitor and the office will call her. 4. She is to follow up with Dr. [name] as needed.  DISCHARGE MEDICATIONS: 1. Lisinopril 5 mg daily. 2. Cymbalta 60 mg daily. 3. Zyprexa 20 mg two tablets at bedtime. 4. BuSpar 30 mg at bedtime. 5. Lunesta 3 mg at bedtime. 6. Toprol-XL 50 mg one and a half tablets daily. 7. Metformin 500 mg a day. 8. Crestor 40 mg a day. 9. Lasix 40 mg b.i.d. 10.Sublingual nitroglycerin p.r.n. 11.Aspirin 81 mg two tablets daily. 12.Nexium 40 mg b.i.d. 13.Potassium 10 mEq, four tablets daily. 14.Albuterol inhaler p.r.n.     Theodore Demark, PA-C   ______________________________ Verne Carrow, MD    RB/MEDQ  D:  05/19/2011  T:  05/19/2011  Job:  [id number]  cc:   [name],  MD  Electronically Signed by Theodore Demark PA-C on 05/28/2011 04:01:11 PM Electronically Signed by Verne Carrow MD on 05/28/2011 04:52:26 PM

## 2011-06-01 ENCOUNTER — Encounter: Payer: Self-pay | Admitting: Cardiology

## 2011-06-01 ENCOUNTER — Ambulatory Visit (INDEPENDENT_AMBULATORY_CARE_PROVIDER_SITE_OTHER): Payer: PRIVATE HEALTH INSURANCE | Admitting: Cardiology

## 2011-06-01 DIAGNOSIS — R0602 Shortness of breath: Secondary | ICD-10-CM

## 2011-06-01 DIAGNOSIS — G4733 Obstructive sleep apnea (adult) (pediatric): Secondary | ICD-10-CM

## 2011-06-01 DIAGNOSIS — R002 Palpitations: Secondary | ICD-10-CM

## 2011-06-01 DIAGNOSIS — R0609 Other forms of dyspnea: Secondary | ICD-10-CM

## 2011-06-01 DIAGNOSIS — I2510 Atherosclerotic heart disease of native coronary artery without angina pectoris: Secondary | ICD-10-CM

## 2011-06-01 DIAGNOSIS — R0989 Other specified symptoms and signs involving the circulatory and respiratory systems: Secondary | ICD-10-CM

## 2011-06-01 DIAGNOSIS — E785 Hyperlipidemia, unspecified: Secondary | ICD-10-CM

## 2011-06-01 LAB — BASIC METABOLIC PANEL
BUN: 20 mg/dL (ref 6–23)
Chloride: 104 mEq/L (ref 96–112)
Potassium: 4.2 mEq/L (ref 3.5–5.1)
Sodium: 144 mEq/L (ref 135–145)

## 2011-06-01 LAB — BRAIN NATRIURETIC PEPTIDE: Pro B Natriuretic peptide (BNP): 21 pg/mL (ref 0.0–100.0)

## 2011-06-01 MED ORDER — BUDESONIDE-FORMOTEROL FUMARATE 80-4.5 MCG/ACT IN AERO: 2.0000 | INHALATION_SPRAY | Freq: Two times a day (BID) | RESPIRATORY_TRACT | Status: DC

## 2011-06-01 NOTE — Patient Instructions (Addendum)
Your physician recommends that you have  lab work today--BMET/BNP 786.09  414.01  Your physician has requested that you have an echocardiogram. Echocardiography is a painless test that uses sound waves to create images of your heart. It provides your doctor with information about the size and shape of your heart and how well your heart's chambers and valves are working. This procedure takes approximately one hour. There are no restrictions for this procedure.  Your physician has recommended that you have a sleep study. This test records several body functions during sleep, including: brain activity, eye movement, oxygen and carbon dioxide blood levels, heart rate and rhythm, breathing rate and rhythm, the flow of air through your mouth and nose, snoring, body muscle movements, and chest and belly movement.     Your physician recommends that you schedule a follow-up appointment in: 2 months with Dr Shirlee Latch.

## 2011-06-01 NOTE — Progress Notes (Signed)
PCP: Dr. Ivory Broad  55 yo with history of CAD and COPD presents for followup.  Since last appointment, she was admitted in 10/12 with very atypical chest pain.  She was ruled out for MI and discharged home.  She has gained 10 lbs since last appointment.  She is staying off cigarettes.    Main complaint today is increased exertional dyspnea over the last two weeks.  She has had some dyspnea for a long time.  She has been on Lasix at the current dose for several years.  She is very short of breath with steps and has chronic 2 pillow orthopnea.  She is short of breath and has to stop after walking about 20 feet.    Patient also reports loud snoring (per her daughter) and daytime sleepiness.  She has never been tested for sleep apnea.   Labs (12/11): BNP 40  Labs (1/12): HCT 38, D dimer negative, LDL 70, HDL 41, creatinine 1.29, TSH  Labs (10/12): TSH normal, LDL 87, HDL 36, K 3.4, creatinine 1.61  Allergies (verified):  1) ! * Vantin  2) Erythromycin Ethylsuccinate (Erythromycin Ethylsuccinate)  3) Cephalexin (Cephalexin)  4) Trazodone Hcl (Trazodone Hcl)   Past Medical History:  1. 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 DEPRESSION, MAJOR (ICD-296.20)  3. Hx of NARCOTIC ABUSE (ICD-305.90)  4. BIPOLAR DISORDER UNSPECIFIED (ICD-296.80)  5. MULTIPLE PERSONALITY DISORDER (ICD-300.14)  6. HYPERTENSION (ICD-401.9)  7. HYPERLIPIDEMIA (ICD-272.4)  8. G E R D (ICD-530.81)  9. C O P D (ICD-496): Quit smoking in 12/11. PFTs (4/10): FEV1 66%, ratio 64%, DLCO 67%.  10. CORONARY HEART DISEASE (ICD-414.00): Cypher DES to mid LAD in 2007. Myoview 3/09 with EF 72%, no ischemia or infarction. Myoview (12/11) EF 73%, suspected anterior soft tissue attenuation with no ischemia or infarction.  Left heart cath (1/12): patent LAD stent, mild LIs only.  11. Partial nephrectomy 1997  12. Diabetes mellitus  13. Echo (1/08): EF 65%, no significant valvular abnormalities. Echo (12/11): EF 55-60%, grade II diastolic  dysfunction, normal wall motion, mild left atrial enlargement.  14. Palpitations: Holter (12/11) with occasional PVCs.  Holter (11/12): rare PACs and PVCs.  15. Esophageal stricture s/p dilatation in 7/12.  16. Obesity.   Family History:  emphysema: mother  asthma: mother  heart disease: father with MI at 71, brother and sister with MIs in their 26s  cancer: mother (ovarian)   Social History:  pt is married with children, lives in [place].  Unemployed  Tobacco Use - Former. 07/07/10  ROS: All systems reviewed and negative except as per HPI.   Current Outpatient Prescriptions  Medication Sig Dispense Refill  . aspirin 81 MG tablet Take 81 mg by mouth daily. 2 tablets daily       . busPIRone (BUSPAR) 30 MG tablet Take 12 tablets by mouth Daily.      . DULoxetine (CYMBALTA) 60 MG capsule Take 60 mg by mouth daily.        . furosemide (LASIX) 40 MG tablet Take 1 tablet (40 mg total) by mouth 2 (two) times daily.  60 tablet  11  . lisinopril (PRINIVIL,ZESTRIL) 5 MG tablet Take 5 mg by mouth daily.        . metFORMIN (GLUCOPHAGE) 500 MG tablet Take 500 mg by mouth daily with breakfast.        . metoprolol (TOPROL XL) 50 MG 24 hr tablet Take 50 mg by mouth. 1 1/2 tablet daily       . NEXIUM 40 MG  capsule TAKE ONE CAPSULE BY MOUTH TWICE DAILY  60 capsule  0  . nitroGLYCERIN (NITROSTAT) 0.4 MG SL tablet Place 0.4 mg under the tongue every 5 (five) minutes as needed.        Marland Kitchen OLANZapine (ZYPREXA) 20 MG tablet Take 2 tablets by mouth Daily.      . Potassium Chloride (KLOR-CON 10 PO) Take 4 tablets by mouth daily.       Marland Kitchen PROAIR HFA 108 (90 BASE) MCG/ACT inhaler       . rosuvastatin (CRESTOR) 40 MG tablet Take 40 mg by mouth daily.        . budesonide-formoterol (SYMBICORT) 80-4.5 MCG/ACT inhaler Inhale 2 puffs into the lungs 2 (two) times daily.  1 Inhaler  2  . DISCONTD: budesonide-formoterol (SYMBICORT) 160-4.5 MCG/ACT inhaler Inhale 2 puffs into the lungs 2 (two) times daily.          Current Facility-Administered Medications  Medication Dose Route Frequency Provider Last Rate Last Dose  . 0.9 %  sodium chloride infusion  500 mL Intravenous Continuous Yancey Flemings, MD        BP 126/96  Pulse 92  Ht 5\' 6"  (1.676 m)  Wt 253 lb (114.76 kg)  BMI 40.84 kg/m2 General: NAD, obese.  Neck: Thick neck, no JVD, no thyromegaly or thyroid nodule.  Lungs: Clear to auscultation bilaterally with mildly prolonged expiratory phase.  CV: Nondisplaced PMI.  Heart regular S1/S2, no S3/S4, no murmur.  No peripheral edema.  No carotid bruit.  Normal pedal pulses.  Abdomen: Soft, nontender, no hepatosplenomegaly, no distention.  Neurologic: Alert and oriented x 3.  Psych: Normal affect. Extremities: No clubbing or cyanosis.

## 2011-06-01 NOTE — Assessment & Plan Note (Signed)
Cardiac cath earlier this year with patent LAD stent and mild luminal irregularities only.  Chest pain prompting admission in 10/12 was very atypical.  I will continue medical management with ASA 81, lisinopril, Toprol XL, and statin.

## 2011-06-01 NOTE — Assessment & Plan Note (Signed)
Symptoms and body habitus create significant suspicion for OSA.  I will get a sleep study.

## 2011-06-01 NOTE — Assessment & Plan Note (Addendum)
I suspect that dyspnea is multifactorial.  Obesity and deconditioning certainly play a large role.  She has significant COPD.  She may have OSA which could contribute a sense of general fatigue.  Diastolic CHF is also a concern though she does not appear volume overoloaded on exam.  I think that CAD is less likely.   - Restart Symbicort.  She says this helped in the past.   - Sleep study as above.  - Echo to assess LV systolic function.  Will also get BNP.  She does not appear particularly volume overloaded so I am not going to increase Lasix.

## 2011-06-01 NOTE — Assessment & Plan Note (Signed)
LDL was a bit above goal recently (goal LDL < 70).  She is already on Crestor 40.  She needs to take it regularly.

## 2011-06-01 NOTE — Assessment & Plan Note (Signed)
Rare PVCs and PACs only on recent holter.

## 2011-06-03 ENCOUNTER — Telehealth: Payer: Self-pay | Admitting: *Deleted

## 2011-06-03 NOTE — Telephone Encounter (Signed)
[name], Merita Norton - sleep study More Detail >>      sleep study      [name]        Sent: Wed June 03, 2011  2:49 PM    To: Jacqlyn Krauss, RN        BROCK MOKRY    MRN: [id number] DOB: 1956-06-08     Pt Home: [phone]               Message     No Show/Cancel within 24 hours (Pt. decided not to have study done.)she will explain everything to Dr. Shirlee Latch per patient.

## 2011-06-03 NOTE — H&P (Signed)
[name], MEDER NO.:  [id number]  MEDICAL RECORD NO.:  [id number]  LOCATION:  2008                         FACILITY:  [facility]  PHYSICIAN:  Peter M. Swaziland, M.D.  DATE OF BIRTH:  1956/01/26  DATE OF ADMISSION:  05/18/2011 DATE OF DISCHARGE:                             HISTORY & PHYSICAL   This is a 55 year old Caucasian female with past medical history of coronary artery disease, type 2 diabetes, hypertension, hyperlipidemia, 40 plus pack year history, family history of premature coronary artery disease presenting to the ED today complaining of chest pain and shortness of breath.  PAST MEDICAL HISTORY: 1. Coronary artery disease.     a.     NSTEMI - 2004, status post cardiac catheterization of left      anterior descending artery without obstruction.     b.     Drug-eluting stent throughout the anterior descending artery      - 2007, left anterior descending stent with drug-eluting stent,      Cypher.     c.     Lexiscan - on December 2011, mild soft tissue attenuation,      failed to show ischemia; January 2012, left heart catheterization;      patent left anterior descending stent, nonobstructive disease and      EF of 65%. 2. Diabetes mellitus type 2. 3. Anemia and thrombocytopenia. 4. COPD. 5. Hypertension. 6. Hyperlipidemia. 7. History of tobacco abuse - quit in December 2011. 8. Esophagitis/duodenitis/esophageal stricture, status post dilatation     in 2007. 9. Bipolar disorder. 10.History of opioid abuse, status post detox in 2007.  PAST SURGICAL HISTORY:  EGD/dilatation, hysterectomy, left renal cystectomy, C-section, carpal tunnel surgery.  ALLERGIES: 1. TRAZODONE - ANAPHYLAXIS. 2. ERYTHROMYCIN - UPSET STOMACH. 3. CEPHALOSPORINS - __________ TOLERATES ROCEPHIN.  HISTORY OF PRESENT ILLNESS:  [name] is a pleasant 55 year old Caucasian female with past medical history significant for coronary artery disease (status post NSTEMI  with nonobstructive left anterior descending lesion on left heart catheterization in 2004, DES to left anterior descending artery with 70% to 75% stenosis in 2007, cardiac catheterization revealing patent left anterior descending stent, nonobstructive disease and EF of 65% in January 2012), type 2 diabetes, hypertension, hyperlipidemia, 40 plus pack year history and family history of premature coronary artery disease presenting to the ED today complaining of chest pain and shortness of breath.  Per the patient, pain began around 4 p.m. on May 17, 2011, substernally radiating to her jaw and back and described as a pressure sensation occurring while cleaning the kitchen which she related to mild exertion.  The pain is constant, rated at 7/10 at rest and 8/10 when aggravated by exertion.  The pain is alleviated somewhat by rest.  She is prescribed sublingual nitroglycerin, but did not take one to try and relieve the pain.  She states that the pain is similar to that experience prior to her most recent catheterization in January 2012, and different from the pain experienced with esophageal reflux and esophagitis.  She endorses associated shortness of breath, dyspnea on exertion, dizziness and palpitations, preceding the chest pain which is a new symptom for her.  She denies nausea, vomiting, fevers, edema, cough, wheezing, and  abdominal pain.  Aspirin 325 mg p.o. was given in the ED.  One inch of Nitropaste was applied without pain or alleviation.  IV heparin was initiated. Troponin I x1 was negative.  MEDICATIONS: 1. Nitroglycerin 0.4 mg sublingual p.r.n. 2. Protonix 40 mg p.o. daily. 3. Aspirin 81 mg p.o. daily. 4. Crestor 40 mg p.o. daily. 5. Lasix 40 mg p.o. b.i.d. 6. Metformin 500 mg 2 tabs p.o. daily. 7. KCl 10 mEq 3 tabs p.o. daily. 8. Symbicort 160/4.5 mcg 2 puffs b.i.d. 9. Toprol-XL 75 mg p.o. daily. 10.Zyprexa 15 mg p.o. nightly.  SOCIAL HISTORY:  [place] She is unemployed.  She is married with 4 children.  Tobacco use approximately 40-pack-years.  She denies alcohol use and recreational drug use.  FAMILY HISTORY:  Mother with emphysema.  Father with myocardial infarction at the age of 36.  Siblings/brother with coronary artery disease in his 30s x5 unspecified stents.  REVIEW OF SYMPTOMS:  CARDIOPULMONARY:  She endorses chest pain, shortness of breath, dyspnea on exertion, orthopnea, paroxysmal nocturnal dyspnea, palpitations, presyncope.  Denies edema, claudication, cough, wheezing.  MUSCULOSKELETAL:  She endorses chronic arthralgia.  Denies joint swelling, deformity, and pain.  GI:  She denies nausea, vomiting, or diarrhea.  All other symptoms reviewed and negative.  PHYSICAL EXAMINATION:  VITAL SIGNS:  Weight 111.13 kg, 245 pounds; pulse 105; respiratory rate 16; blood pressure 153/92, and oxygen saturation 99% on room air. GENERAL:  Well-appearing, in no acute distress. HEENT:  Pupils equal, round, and reactive to light.  Extraocular muscles intact.  Sclerae clear. NECK:  Supple without lymphadenopathy, bruits, or JVD. CARDIOVASCULAR:  Regular rate and rhythm, clear S1, S2 without murmurs, rubs, gallops, heaves, normal PMI, pulse is 2+ and equal without bruits. LUNGS:  Clear to auscultation bilaterally without wheezes, rales, or rhonchi. SKIN:  No rash/lesions. ABDOMINAL:  Soft, nontender, normoactive bowel sounds, no rebound/guarding. EXTREMITIES:  No clubbing, cyanosis, edema, rash, lesions, or petechiae. MUSCULOSKELETAL:  No joint deformity, fusions, no spine/CVA tenderness. NEURO:  A&O x3.  RADIOLOGY:  Chest x-ray/chronic interstitial markings.  No pleural effusion or pneumothorax.  Cardiomediastinal silhouette within normal limits.  Mild degenerative changes of thoracolumbar spine.  EKG:  Rate 104 rhythm, sinus tachycardia, normal axis, no Q-waves or ST/ischemic changes.  LABORATORY  DATA:  White blood cell count 8.7, hemoglobin 14.2, hematocrit 42, and platelets 231.  Sodium 140, potassium 3.4, chloride 102, bicarb 28, BUN 16, creatinine 1.2, glucose 136, and troponin I 0.01.  ASSESSMENT AND PLAN: 1. Acute coronary syndrome/unstable angina.  This is a 55 year old     white female, status post stent placement to the left anterior     descending artery in 2007 presents with symptoms of chest pain for     less than 24 hours, worse with deep breathing and lying on back.     The patient had similar symptoms in January 2012 - cath negative at     that time.  I think symptoms are atypical for angina.  We will     admit to telemetry to rule out myocardial infarction.  Start     analgesics/tramadol for pain. 2. Hypertension - Continue Toprol-XL 75 mg p.o. daily. 3. Hyperlipidemia - Continue Crestor 40 mg p.o. daily. 4. History of esophagitis - Continue Protonix 40 mg p.o. daily. 5. Type II diabetes mellitus - Hold metformin, initiate sliding scale     insulin while inpatient. 6. Chronic obstructive pulmonary disease - Continue outpatient  Symbicort 160/4.5 mcg 2 puffs b.i.d. 7. Continue outpatient Lasix 40 mg p.o. b.i.d., KCl 10 mEq 3 tabs p.o.     daily, and Zyprexa 15 mg p.o. nightly.   Odella Aquas, PA-C ______________________________ Odella Aquas, PA-C   ______________________________ Peter M. [place]iland, M.D.    RA/MEDQ  D:  05/18/2011  T:  05/19/2011  Job:  [id number]  cc:   Marca Ancona, MD  Electronically Signed by Odella Aquas PA on 05/28/2011 02:49:14 PM Electronically Signed by PETER Swaziland M.D. on 06/03/2011 04:47:31 PM

## 2011-06-10 ENCOUNTER — Telehealth: Payer: Self-pay | Admitting: *Deleted

## 2011-06-10 DIAGNOSIS — I2510 Atherosclerotic heart disease of native coronary artery without angina pectoris: Secondary | ICD-10-CM

## 2011-06-10 NOTE — Telephone Encounter (Signed)
Lasix decreased to 40mg  daily

## 2011-06-12 ENCOUNTER — Other Ambulatory Visit (HOSPITAL_COMMUNITY): Payer: PRIVATE HEALTH INSURANCE | Admitting: Radiology

## 2011-06-16 ENCOUNTER — Other Ambulatory Visit (HOSPITAL_COMMUNITY): Payer: PRIVATE HEALTH INSURANCE | Admitting: Radiology

## 2011-06-19 ENCOUNTER — Other Ambulatory Visit (HOSPITAL_COMMUNITY): Payer: PRIVATE HEALTH INSURANCE | Admitting: Radiology

## 2011-06-19 ENCOUNTER — Other Ambulatory Visit: Payer: PRIVATE HEALTH INSURANCE | Admitting: *Deleted

## 2011-06-23 ENCOUNTER — Encounter (HOSPITAL_BASED_OUTPATIENT_CLINIC_OR_DEPARTMENT_OTHER): Payer: PRIVATE HEALTH INSURANCE

## 2011-06-24 ENCOUNTER — Other Ambulatory Visit: Payer: PRIVATE HEALTH INSURANCE | Admitting: *Deleted

## 2011-06-25 NOTE — Telephone Encounter (Signed)
Can you please close this encounter

## 2011-08-03 ENCOUNTER — Other Ambulatory Visit: Payer: Self-pay | Admitting: Internal Medicine

## 2011-08-04 ENCOUNTER — Ambulatory Visit: Payer: PRIVATE HEALTH INSURANCE | Admitting: Cardiology

## 2011-08-06 ENCOUNTER — Other Ambulatory Visit (HOSPITAL_COMMUNITY): Payer: PRIVATE HEALTH INSURANCE | Admitting: Radiology

## 2011-08-18 ENCOUNTER — Telehealth: Payer: Self-pay

## 2011-08-18 ENCOUNTER — Other Ambulatory Visit: Payer: Self-pay

## 2011-08-18 MED ORDER — ESOMEPRAZOLE MAGNESIUM 40 MG PO CPDR: 40.0000 mg | DELAYED_RELEASE_CAPSULE | Freq: Two times a day (BID) | ORAL | Status: DC

## 2011-08-18 NOTE — Telephone Encounter (Signed)
faxed request for refill of Nexium; received approval from [place] and refilled rx

## 2011-08-19 ENCOUNTER — Telehealth: Payer: Self-pay | Admitting: *Deleted

## 2011-08-19 NOTE — Telephone Encounter (Signed)
A pharmacist from [facility] called and said the patient cannot afford the $415.00 for the Nexium.  They are asking if we could prescribe something else.

## 2011-08-20 ENCOUNTER — Telehealth: Payer: Self-pay

## 2011-08-20 NOTE — Telephone Encounter (Signed)
Discussed with pt her deductible issue that makes Nexium to expensive at this time;  she is going to try Prevacid over the counter and keep me

## 2011-08-20 NOTE — Telephone Encounter (Signed)
spoke with pharmacist and told her Nexium had been approved but patient still has deductible to meet making rx too expensive

## 2011-08-24 ENCOUNTER — Ambulatory Visit: Payer: PRIVATE HEALTH INSURANCE | Admitting: Cardiology

## 2011-09-12 ENCOUNTER — Observation Stay (HOSPITAL_COMMUNITY)
Admission: EM | Admit: 2011-09-12 | Discharge: 2011-09-13 | DRG: 313 | Disposition: A | Discharge status: Home / Self Care | Payer: PRIVATE HEALTH INSURANCE | Attending: Internal Medicine | Admitting: Internal Medicine

## 2011-09-12 ENCOUNTER — Emergency Department (HOSPITAL_COMMUNITY): Payer: PRIVATE HEALTH INSURANCE

## 2011-09-12 ENCOUNTER — Other Ambulatory Visit: Payer: Self-pay

## 2011-09-12 ENCOUNTER — Encounter (HOSPITAL_COMMUNITY): Payer: Self-pay

## 2011-09-12 DIAGNOSIS — F329 Major depressive disorder, single episode, unspecified: Secondary | ICD-10-CM | POA: Insufficient documentation

## 2011-09-12 DIAGNOSIS — R000 Tachycardia, unspecified: Secondary | ICD-10-CM | POA: Insufficient documentation

## 2011-09-12 DIAGNOSIS — J449 Chronic obstructive pulmonary disease, unspecified: Secondary | ICD-10-CM | POA: Insufficient documentation

## 2011-09-12 DIAGNOSIS — J439 Emphysema, unspecified: Secondary | ICD-10-CM | POA: Diagnosis present

## 2011-09-12 DIAGNOSIS — I252 Old myocardial infarction: Secondary | ICD-10-CM | POA: Insufficient documentation

## 2011-09-12 DIAGNOSIS — D649 Anemia, unspecified: Secondary | ICD-10-CM | POA: Insufficient documentation

## 2011-09-12 DIAGNOSIS — K219 Gastro-esophageal reflux disease without esophagitis: Secondary | ICD-10-CM | POA: Insufficient documentation

## 2011-09-12 DIAGNOSIS — R079 Chest pain, unspecified: Principal | ICD-10-CM | POA: Insufficient documentation

## 2011-09-12 DIAGNOSIS — R0902 Hypoxemia: Secondary | ICD-10-CM | POA: Insufficient documentation

## 2011-09-12 DIAGNOSIS — E785 Hyperlipidemia, unspecified: Secondary | ICD-10-CM | POA: Insufficient documentation

## 2011-09-12 DIAGNOSIS — J4489 Other specified chronic obstructive pulmonary disease: Secondary | ICD-10-CM | POA: Insufficient documentation

## 2011-09-12 DIAGNOSIS — F4481 Dissociative identity disorder: Secondary | ICD-10-CM | POA: Insufficient documentation

## 2011-09-12 DIAGNOSIS — R0602 Shortness of breath: Secondary | ICD-10-CM | POA: Insufficient documentation

## 2011-09-12 DIAGNOSIS — E876 Hypokalemia: Secondary | ICD-10-CM | POA: Insufficient documentation

## 2011-09-12 DIAGNOSIS — G4733 Obstructive sleep apnea (adult) (pediatric): Secondary | ICD-10-CM | POA: Insufficient documentation

## 2011-09-12 DIAGNOSIS — K449 Diaphragmatic hernia without obstruction or gangrene: Secondary | ICD-10-CM | POA: Insufficient documentation

## 2011-09-12 DIAGNOSIS — I2510 Atherosclerotic heart disease of native coronary artery without angina pectoris: Secondary | ICD-10-CM | POA: Insufficient documentation

## 2011-09-12 DIAGNOSIS — I10 Essential (primary) hypertension: Secondary | ICD-10-CM | POA: Insufficient documentation

## 2011-09-12 DIAGNOSIS — F319 Bipolar disorder, unspecified: Secondary | ICD-10-CM | POA: Insufficient documentation

## 2011-09-12 DIAGNOSIS — E119 Type 2 diabetes mellitus without complications: Secondary | ICD-10-CM | POA: Insufficient documentation

## 2011-09-12 MED ORDER — FENTANYL CITRATE 0.05 MG/ML IJ SOLN
50.0000 ug | Freq: Once | INTRAMUSCULAR | Status: AC
  Administered 2011-09-12: 50 ug via INTRAVENOUS
  Filled 2011-09-12: qty 2

## 2011-09-12 MED ORDER — ONDANSETRON HCL 4 MG/2ML IJ SOLN
INTRAMUSCULAR | Status: AC
  Administered 2011-09-12
  Filled 2011-09-12: qty 2

## 2011-09-12 NOTE — ED Notes (Signed)
Enroute w/ EMS; pt was given 2 SL nitros, and 2 ODT Zofran. Pt took 4 baby asa prior to EMS arrival.

## 2011-09-12 NOTE — ED Notes (Signed)
Pt was lying in bed when CP began; denies diaphoresis, vomiting. Pt c/o nausea as well

## 2011-09-12 NOTE — ED Provider Notes (Signed)
History     CSN: [id number]  Arrival date & time 09/12/11  2319   None     Chief Complaint  Patient presents with  . Chest Pain    (Consider location/radiation/quality/duration/timing/severity/associated sxs/prior treatment) HPI Is a 56 year old white female history of coronary artery disease. She has a 2 hour history of chest pressure that came on somewhat gradually. It is located in the precordium and radiates to the left shoulder area. She rates it is moderate. It was associated with palpitations (rapid heart rate), nausea but no vomiting. There was no diaphoresis. There is shortness of breath. She called EMS who gave her aspirin, Zofran and 2 sublingual nitroglycerin. For Zofran relieved her nausea but she continues to have chest discomfort, dyspnea and palpitations. She has some chronic edema of the lower legs but no acute change. She denies leg pain.  Past Medical History  Diagnosis Date  . Anemia, unspecified   . Major depressive disorder, single episode, unspecified   . Other, mixed, or unspecified nondependent drug abuse, unspecified   . Bipolar disorder, unspecified   . Dissociative identity disorder   . Unspecified essential hypertension   . Other and unspecified hyperlipidemia   . Esophageal reflux   . Coronary atherosclerosis of unspecified type of vessel, native or graft   . Diabetes mellitus   . Hiatal hernia   . Asthma   . Chronic airway obstruction, not elsewhere classified     PT. DENIES  . Myocardial infarction     Past Surgical History  Procedure Date  . Cesarean section   . Bladder surgery     Tack  . Nephrectomy     Part of left kidney  . Tonsillectomy and adenoidectomy   . Carpal tunnel release     BILATERAL HANDS    Family History  Problem Relation Age of Onset  . Emphysema Mother   . Ovarian cancer Mother   . Heart disease Father     mi age 58  . Heart attack Sister 30  . Heart attack Brother 30  . Colon cancer Neg Hx     History    Substance Use Topics  . Smoking status: Former Smoker -- 40 years    Types: Cigarettes    Quit date: 07/07/2010  . Smokeless tobacco: Never Used  . Alcohol Use: No    OB History    Grav Para Term Preterm Abortions TAB SAB Ect Mult Living                  Review of Systems  All other systems reviewed and are negative.    Allergies  Cefpodoxime proxetil; Cephalexin; Erythromycin ethylsuccinate; and Trazodone hcl  Home Medications   Current Outpatient Rx  Name Route Sig Dispense Refill  . ASPIRIN 81 MG PO TABS Oral Take 81 mg by mouth daily. 2 tablets daily     . BUDESONIDE-FORMOTEROL FUMARATE 80-4.5 MCG/ACT IN AERO Inhalation Inhale 2 puffs into the lungs 2 (two) times daily. 1 Inhaler 2  . BUSPIRONE HCL 30 MG PO TABS Oral Take 12 tablets by mouth Daily.    . DULOXETINE HCL 60 MG PO CPEP Oral Take 60 mg by mouth daily.      Marland Kitchen ESOMEPRAZOLE MAGNESIUM 40 MG PO CPDR Oral Take 1 capsule (40 mg total) by mouth 2 (two) times daily. 60 capsule 6  . FUROSEMIDE 40 MG PO TABS Oral Take 1 tablet (40 mg total) by mouth daily.    Marland Kitchen LISINOPRIL 5 MG PO  TABS Oral Take 5 mg by mouth daily.      Marland Kitchen METFORMIN HCL 500 MG PO TABS Oral Take 500 mg by mouth daily with breakfast.      . METOPROLOL SUCCINATE ER 50 MG PO TB24 Oral Take 50 mg by mouth. 1 1/2 tablet daily     . NITROGLYCERIN 0.4 MG SL SUBL Sublingual Place 0.4 mg under the tongue every 5 (five) minutes as needed.      Marland Kitchen OLANZAPINE 20 MG PO TABS Oral Take 2 tablets by mouth Daily.    Marland Kitchen KLOR-CON 10 PO Oral Take 4 tablets by mouth daily.     Marland Kitchen PROAIR HFA 108 (90 BASE) MCG/ACT IN AERS      . ROSUVASTATIN CALCIUM 40 MG PO TABS Oral Take 40 mg by mouth daily.        BP 155/103  Pulse 55  Temp(Src) 98.3 F (36.8 C) (Oral)  Resp 18  SpO2 98%  Physical Exam General: Well-developed, well-nourished female in no acute distress; appearance consistent with age of record HENT: normocephalic, atraumatic Eyes: pupils equal round and reactive to  light; extraocular muscles intact Neck: supple Heart: regular rate and rhythm; sinus tachycardia on monitor Lungs: clear to auscultation bilaterally; distant cell Abdomen: soft; nondistended; nontender; obese; bowel sounds present Extremities: No deformity; full range of motion; pulses normal; trace edema of lower leg Neurologic: Awake, alert and oriented; motor function intact in all extremities and symmetric; no facial droop Skin: Warm and dry Psychiatric: Mildly anxious    ED Course  Procedures (including critical care time)     MDM  EKG Interpretation:  Date & Time: 09/12/2011 11:28 PM  Rate: 130  Rhythm: sinus tachycardia  QRS Axis: normal  Intervals: normal  ST/T Wave abnormalities: nonspecific ST changes  Conduction Disutrbances:none  Narrative Interpretation:   Old EKG Reviewed: Rate is faster  Nursing notes and vitals signs, including pulse oximetry, reviewed.  Summary of this visit's results, reviewed by myself:  Labs:  Results for orders placed during the hospital encounter of 09/12/11  CBC      Component Value Range   WBC 9.8  4.0 - 10.5 (K/uL)   RBC 4.11  3.87 - 5.11 (MIL/uL)   Hemoglobin 13.6  12.0 - 15.0 (g/dL)   HCT 16.1  09.6 - 04.5 (%)   MCV 99.0  78.0 - 100.0 (fL)   MCH 33.1  26.0 - 34.0 (pg)   MCHC 33.4  30.0 - 36.0 (g/dL)   RDW 40.9  81.1 - 91.4 (%)   Platelets 229  150 - 400 (K/uL)  DIFFERENTIAL      Component Value Range   Neutrophils Relative 55  43 - 77 (%)   Neutro Abs 5.4  1.7 - 7.7 (K/uL)   Lymphocytes Relative 34  12 - 46 (%)   Lymphs Abs 3.3  0.7 - 4.0 (K/uL)   Monocytes Relative 7  3 - 12 (%)   Monocytes Absolute 0.7  0.1 - 1.0 (K/uL)   Eosinophils Relative 4  0 - 5 (%)   Eosinophils Absolute 0.4  0.0 - 0.7 (K/uL)   Basophils Relative 0  0 - 1 (%)   Basophils Absolute 0.0  0.0 - 0.1 (K/uL)  TROPONIN I      Component Value Range   Troponin I <0.30  <0.30 (ng/mL)  D-DIMER, QUANTITATIVE      Component Value Range   D-Dimer,  Quant 0.51 (*) 0.00 - 0.48 (ug/mL-FEU)  POCT I-STAT, CHEM 8  Component Value Range   Sodium 141  135 - 145 (mEq/L)   Potassium 3.3 (*) 3.5 - 5.1 (mEq/L)   Chloride 104  96 - 112 (mEq/L)   BUN 10  6 - 23 (mg/dL)   Creatinine, Ser 1.61  0.50 - 1.10 (mg/dL)   Glucose, Bld 096 (*) 70 - 99 (mg/dL)   Calcium, Ion 0.45  4.09 - 1.32 (mmol/L)   TCO2 27  0 - 100 (mmol/L)   Hemoglobin 14.3  12.0 - 15.0 (g/dL)   HCT 81.1  91.4 - 78.2 (%)  POCT I-STAT TROPONIN I      Component Value Range   Troponin i, poc 0.00  0.00 - 0.08 (ng/mL)   Comment 3           POCT I-STAT TROPONIN I      Component Value Range   Troponin i, poc 0.01  0.00 - 0.08 (ng/mL)   Comment 3             Imaging Studies: Ct Angio Chest W/cm &/or Wo Cm  09/13/2011  *RADIOLOGY REPORT*  Clinical Data: Shortness of breath and mid chest pain; hypoxia, tachycardia and dyspnea.  Elevated D-dimer.  History of diabetes.  CT ANGIOGRAPHY CHEST  Technique:  Multidetector CT imaging of the chest using the standard protocol during bolus administration of intravenous contrast. Multiplanar reconstructed images including MIPs were obtained and reviewed to evaluate the vascular anatomy.  Contrast: OMNIPAQUE IOHEXOL 350 MG/ML IV SOLN  Comparison: Chest radiograph performed earlier today at 12:03 a.m., and CTA of the chest performed 09/27/2008  Findings: There is no evidence of pulmonary embolus.  Minimal atelectasis is noted within the right middle lobe.  The lungs are otherwise clear.  There is no evidence of significant focal consolidation, pleural effusion or pneumothorax.  No masses are identified; no abnormal focal contrast enhancement is seen.  The mediastinum is unremarkable in appearance.  There is no evidence of mediastinal lymphadenopathy.  No pericardial effusion is identified.  The great vessels are unremarkable in appearance. No axillary lymphadenopathy is seen.  The thyroid gland is unremarkable in appearance.  The visualized portions  of the liver and spleen are unremarkable. Numerous small cysts are noted at the upper pole of the left kidney; these are slightly more apparent than on the prior CTA.  No acute osseous abnormalities are seen.  IMPRESSION:  1.  No evidence of pulmonary embolus. 2.  Minimal atelectasis within the right middle lobe; lungs otherwise clear. 3.  Numerous small left renal cysts noted, mildly more apparent than on the prior CTA.  Original Report Authenticated By: Tonia Ghent, M.D.   Dg Chest Port 1 View  09/13/2011  *RADIOLOGY REPORT*  Clinical Data: Chest pain.  PORTABLE CHEST - 1 VIEW  Comparison: 05/18/2011  Findings: Single view of the chest was obtained.  Patient is rotated on this examination.  No focal airspace disease or edema. Heart size is grossly stable.  IMPRESSION: No acute chest findings.  Original Report Authenticated By: Richarda Overlie, M.D.   5:30AM Patient's pain is improved. Tachycardia is resolved. CT scan negative for pulmonary embolism. Will have patient admitted to the hospitalist service.            Hanley Seamen, MD 09/13/11 [id number]

## 2011-09-13 ENCOUNTER — Other Ambulatory Visit: Payer: Self-pay

## 2011-09-13 ENCOUNTER — Emergency Department (HOSPITAL_COMMUNITY): Payer: PRIVATE HEALTH INSURANCE

## 2011-09-13 ENCOUNTER — Encounter (HOSPITAL_COMMUNITY): Payer: Self-pay | Admitting: Radiology

## 2011-09-13 DIAGNOSIS — E876 Hypokalemia: Secondary | ICD-10-CM | POA: Diagnosis present

## 2011-09-13 DIAGNOSIS — R000 Tachycardia, unspecified: Secondary | ICD-10-CM | POA: Diagnosis present

## 2011-09-13 LAB — DRUGS OF ABUSE SCREEN W/O ALC, ROUTINE URINE
Amphetamine Screen, Ur: NEGATIVE
Barbiturate Quant, Ur: NEGATIVE
Benzodiazepines.: NEGATIVE
Marijuana Metabolite: NEGATIVE
Methadone: NEGATIVE
Phencyclidine (PCP): NEGATIVE
Propoxyphene: NEGATIVE

## 2011-09-13 LAB — POCT I-STAT TROPONIN I
Troponin i, poc: 0 ng/mL (ref 0.00–0.08)
Troponin i, poc: 0.01 ng/mL (ref 0.00–0.08)

## 2011-09-13 LAB — CBC
HCT: 39.2 % (ref 36.0–46.0)
HCT: 40.7 % (ref 36.0–46.0)
Hemoglobin: 13.6 g/dL (ref 12.0–15.0)
MCH: 33.1 pg (ref 26.0–34.0)
MCHC: 32.4 g/dL (ref 30.0–36.0)
MCHC: 33.4 g/dL (ref 30.0–36.0)
Platelets: 205 10*3/uL (ref 150–400)
RBC: 4.11 MIL/uL (ref 3.87–5.11)
RDW: 13.1 % (ref 11.5–15.5)
WBC: 7.9 10*3/uL (ref 4.0–10.5)

## 2011-09-13 LAB — COMPREHENSIVE METABOLIC PANEL
ALT: 24 U/L (ref 0–35)
Alkaline Phosphatase: 60 U/L (ref 39–117)
BUN: 9 mg/dL (ref 6–23)
CO2: 29 mEq/L (ref 19–32)
GFR calc Af Amer: 74 mL/min — ABNORMAL LOW (ref >90)
GFR calc non Af Amer: 64 mL/min — ABNORMAL LOW (ref >90)
Glucose, Bld: 118 mg/dL — ABNORMAL HIGH (ref 70–99)
Potassium: 3.3 mEq/L — ABNORMAL LOW (ref 3.5–5.1)
Sodium: 139 mEq/L (ref 135–145)
Total Bilirubin: 0.4 mg/dL (ref 0.3–1.2)
Total Protein: 5.5 g/dL — ABNORMAL LOW (ref 6.0–8.3)

## 2011-09-13 LAB — HEMOGLOBIN A1C
Hgb A1c MFr Bld: 5.9 % — ABNORMAL HIGH (ref <5.7)
Mean Plasma Glucose: 123 mg/dL — ABNORMAL HIGH (ref <117)

## 2011-09-13 LAB — CARDIAC PANEL(CRET KIN+CKTOT+MB+TROPI)
CK, MB: 2.8 ng/mL (ref 0.3–4.0)
CK, MB: 2.6 ng/mL (ref 0.3–4.0)
Total CK: 58 U/L (ref 7–177)
Troponin I: <0.3 ng/mL (ref <0.30)

## 2011-09-13 LAB — POCT I-STAT, CHEM 8
BUN: 10 mg/dL (ref 6–23)
Chloride: 104 mEq/L (ref 96–112)
Creatinine, Ser: 0.7 mg/dL (ref 0.50–1.10)
Sodium: 141 mEq/L (ref 135–145)
TCO2: 27 mmol/L (ref 0–100)

## 2011-09-13 LAB — DIFFERENTIAL
Basophils Relative: 0 % (ref 0–1)
Lymphs Abs: 3.3 10*3/uL (ref 0.7–4.0)
Monocytes Absolute: 0.7 10*3/uL (ref 0.1–1.0)
Monocytes Relative: 7 % (ref 3–12)
Neutro Abs: 5.4 10*3/uL (ref 1.7–7.7)
Neutrophils Relative %: 55 % (ref 43–77)

## 2011-09-13 LAB — GLUCOSE, CAPILLARY
Glucose-Capillary: 114 mg/dL — ABNORMAL HIGH (ref 70–99)
Glucose-Capillary: 108 mg/dL — ABNORMAL HIGH (ref 70–99)

## 2011-09-13 LAB — D-DIMER, QUANTITATIVE: D-Dimer, Quant: 0.51 ug/mL-FEU — ABNORMAL HIGH (ref 0.00–0.48)

## 2011-09-13 MED ORDER — INSULIN ASPART 100 UNIT/ML ~~LOC~~ SOLN
0.0000 [IU] | Freq: Every day | SUBCUTANEOUS | Status: DC
  Filled 2011-09-13: qty 3

## 2011-09-13 MED ORDER — IOHEXOL 350 MG/ML SOLN
100.0000 mL | Freq: Once | INTRAVENOUS | Status: AC | PRN
  Administered 2011-09-13: 100 mL via INTRAVENOUS

## 2011-09-13 MED ORDER — SODIUM CHLORIDE 0.9 % IJ SOLN
3.0000 mL | Freq: Two times a day (BID) | INTRAMUSCULAR | Status: DC
  Administered 2011-09-13: 3 mL via INTRAVENOUS

## 2011-09-13 MED ORDER — METOPROLOL SUCCINATE ER 100 MG PO TB24
100.0000 mg | ORAL_TABLET | Freq: Every day | ORAL | Status: DC
  Filled 2011-09-13: qty 1

## 2011-09-13 MED ORDER — FUROSEMIDE 40 MG PO TABS: 40.0000 mg | ORAL_TABLET | Freq: Every day | ORAL | Status: DC

## 2011-09-13 MED ORDER — ACETAMINOPHEN 650 MG RE SUPP: 650.0000 mg | Freq: Four times a day (QID) | RECTAL | Status: DC | PRN

## 2011-09-13 MED ORDER — LISINOPRIL 20 MG PO TABS: 20.0000 mg | ORAL_TABLET | Freq: Every day | ORAL | Status: DC

## 2011-09-13 MED ORDER — SODIUM CHLORIDE 0.9 % IJ SOLN: 3.0000 mL | INTRAMUSCULAR | Status: DC | PRN

## 2011-09-13 MED ORDER — ONDANSETRON HCL 4 MG PO TABS: 4.0000 mg | ORAL_TABLET | Freq: Four times a day (QID) | ORAL | Status: DC | PRN

## 2011-09-13 MED ORDER — METOPROLOL TARTRATE 25 MG PO TABS
25.0000 mg | ORAL_TABLET | Freq: Four times a day (QID) | ORAL | Status: DC
  Administered 2011-09-13 (×2): 25 mg via ORAL
  Filled 2011-09-13 (×5): qty 1

## 2011-09-13 MED ORDER — ACETAMINOPHEN 325 MG PO TABS
650.0000 mg | ORAL_TABLET | Freq: Four times a day (QID) | ORAL | Status: DC | PRN
  Administered 2011-09-13 (×2): 650 mg via ORAL
  Filled 2011-09-13 (×2): qty 2

## 2011-09-13 MED ORDER — GUAIFENESIN ER 600 MG PO TB12
600.0000 mg | ORAL_TABLET | Freq: Two times a day (BID) | ORAL | Status: DC
  Administered 2011-09-13: 600 mg via ORAL
  Filled 2011-09-13 (×2): qty 1

## 2011-09-13 MED ORDER — LEVALBUTEROL HCL 0.63 MG/3ML IN NEBU
0.6300 mg | INHALATION_SOLUTION | RESPIRATORY_TRACT | Status: DC | PRN
  Filled 2011-09-13: qty 3

## 2011-09-13 MED ORDER — ROSUVASTATIN CALCIUM 10 MG PO TABS
10.0000 mg | ORAL_TABLET | Freq: Every day | ORAL | Status: DC
  Filled 2011-09-13: qty 1

## 2011-09-13 MED ORDER — AMLODIPINE BESYLATE 5 MG PO TABS
5.0000 mg | ORAL_TABLET | Freq: Every day | ORAL | Status: DC
  Filled 2011-09-13: qty 1

## 2011-09-13 MED ORDER — BUDESONIDE-FORMOTEROL FUMARATE 160-4.5 MCG/ACT IN AERO: 2.0000 | INHALATION_SPRAY | Freq: Two times a day (BID) | RESPIRATORY_TRACT | Status: DC

## 2011-09-13 MED ORDER — INSULIN ASPART 100 UNIT/ML ~~LOC~~ SOLN: 0.0000 [IU] | Freq: Three times a day (TID) | SUBCUTANEOUS | Status: DC

## 2011-09-13 MED ORDER — ALBUTEROL 90 MCG/ACT IN AERS: 2.0000 | INHALATION_SPRAY | Freq: Four times a day (QID) | RESPIRATORY_TRACT | Status: DC | PRN

## 2011-09-13 MED ORDER — POTASSIUM CHLORIDE CRYS ER 20 MEQ PO TBCR
40.0000 meq | EXTENDED_RELEASE_TABLET | Freq: Once | ORAL | Status: AC
  Administered 2011-09-13: 40 meq via ORAL
  Filled 2011-09-13: qty 2

## 2011-09-13 MED ORDER — DOCUSATE SODIUM 100 MG PO CAPS: 100.0000 mg | ORAL_CAPSULE | Freq: Two times a day (BID) | ORAL | Status: DC

## 2011-09-13 MED ORDER — ALBUTEROL SULFATE (5 MG/ML) 0.5% IN NEBU: 2.5000 mg | INHALATION_SOLUTION | RESPIRATORY_TRACT | Status: DC | PRN

## 2011-09-13 MED ORDER — NITROGLYCERIN 0.4 MG SL SUBL: 0.4000 mg | SUBLINGUAL_TABLET | SUBLINGUAL | Status: DC | PRN

## 2011-09-13 MED ORDER — PANTOPRAZOLE SODIUM 40 MG PO TBEC
40.0000 mg | DELAYED_RELEASE_TABLET | Freq: Every day | ORAL | Status: DC
  Administered 2011-09-13: 40 mg via ORAL
  Filled 2011-09-13: qty 1

## 2011-09-13 MED ORDER — ONDANSETRON HCL 4 MG/2ML IJ SOLN: 4.0000 mg | Freq: Four times a day (QID) | INTRAMUSCULAR | Status: DC | PRN

## 2011-09-13 MED ORDER — RISPERIDONE 2 MG PO TABS
2.0000 mg | ORAL_TABLET | Freq: Every day | ORAL | Status: DC
  Filled 2011-09-13: qty 1

## 2011-09-13 MED ORDER — LISINOPRIL 20 MG PO TABS
20.0000 mg | ORAL_TABLET | Freq: Every day | ORAL | Status: DC
  Filled 2011-09-13: qty 1

## 2011-09-13 MED ORDER — POTASSIUM CHLORIDE ER 10 MEQ PO TBCR
10.0000 meq | EXTENDED_RELEASE_TABLET | Freq: Two times a day (BID) | ORAL | Status: DC
  Filled 2011-09-13 (×2): qty 1

## 2011-09-13 MED ORDER — ALUM & MAG HYDROXIDE-SIMETH 200-200-20 MG/5ML PO SUSP: 30.0000 mL | Freq: Four times a day (QID) | ORAL | Status: DC | PRN

## 2011-09-13 MED ORDER — GUAIFENESIN-DM 100-10 MG/5ML PO SYRP: 5.0000 mL | ORAL_SOLUTION | ORAL | Status: DC | PRN

## 2011-09-13 MED ORDER — FUROSEMIDE 40 MG PO TABS
40.0000 mg | ORAL_TABLET | Freq: Every day | ORAL | Status: DC
  Administered 2011-09-13: 40 mg via ORAL
  Filled 2011-09-13: qty 1

## 2011-09-13 MED ORDER — ENOXAPARIN SODIUM 40 MG/0.4ML ~~LOC~~ SOLN
40.0000 mg | SUBCUTANEOUS | Status: DC
  Administered 2011-09-13: 40 mg via SUBCUTANEOUS
  Filled 2011-09-13: qty 0.4

## 2011-09-13 MED ORDER — MORPHINE SULFATE 2 MG/ML IJ SOLN
0.5000 mg | INTRAMUSCULAR | Status: DC | PRN
  Administered 2011-09-13: 0.5 mg via INTRAVENOUS
  Filled 2011-09-13: qty 1

## 2011-09-13 MED ORDER — DULOXETINE HCL 60 MG PO CPEP
60.0000 mg | ORAL_CAPSULE | Freq: Every day | ORAL | Status: DC
Start: 2011-09-13 — End: 2011-09-13
  Administered 2011-09-13: 60 mg via ORAL
  Filled 2011-09-13: qty 1

## 2011-09-13 MED ORDER — IPRATROPIUM BROMIDE 0.02 % IN SOLN
0.5000 mg | Freq: Four times a day (QID) | RESPIRATORY_TRACT | Status: DC
  Administered 2011-09-13 (×2): 0.5 mg via RESPIRATORY_TRACT
  Filled 2011-09-13 (×2): qty 2.5

## 2011-09-13 MED ORDER — SENNA 8.6 MG PO TABS
1.0000 | ORAL_TABLET | Freq: Two times a day (BID) | ORAL | Status: DC
  Administered 2011-09-13: 8.6 mg via ORAL
  Filled 2011-09-13 (×2): qty 1

## 2011-09-13 MED ORDER — SODIUM CHLORIDE 0.9 % IV SOLN: 250.0000 mL | INTRAVENOUS | Status: DC | PRN

## 2011-09-13 MED ORDER — METOPROLOL SUCCINATE ER 50 MG PO TB24: 100.0000 mg | ORAL_TABLET | Freq: Every day | ORAL | Status: DC

## 2011-09-13 MED ORDER — ROSUVASTATIN CALCIUM 10 MG PO TABS: 10.0000 mg | ORAL_TABLET | Freq: Every day | ORAL | Status: DC

## 2011-09-13 MED ORDER — ASPIRIN 81 MG PO CHEW
81.0000 mg | CHEWABLE_TABLET | Freq: Every day | ORAL | Status: DC
  Administered 2011-09-13: 81 mg via ORAL
  Filled 2011-09-13: qty 1

## 2011-09-13 NOTE — H&P (Addendum)
PCP:   Garth Schlatter, MD, MD  [facility]: [place]  Chief Complaint:   Chest pain  HPI: [name] is a 56 y.o. female   has a past medical history of Anemia, unspecified; Major depressive disorder, single episode, unspecified; Other, mixed, or unspecified nondependent drug abuse, unspecified; Bipolar disorder, unspecified; Dissociative identity disorder; Unspecified essential hypertension; Other and unspecified hyperlipidemia; Esophageal reflux; Coronary atherosclerosis of unspecified type of vessel, native or graft; Diabetes mellitus; Hiatal hernia; Asthma; Chronic airway obstruction, not elsewhere classified; and Myocardial infarction.   Presented with  Chest pain that occurred at rest tonight started around 9pm feels like pressure, heavy sensation. Started while in bed associated with  some of SOB, nausea, palpitation. Non reproducible by palpation. Not improved by nitro given by EMS. She states she still have some dull ache and would like some tylenol for it. Recently have had a few Mountain dews, have been drinking large amount of coke and sweet tea. Recently have not sleped good staying up for days at a time. EMS chechked BP at home and it was 220/160. Patient was very anxious per her family. Of note she has self discontinued a number of her medications for various reasons.   She was recently changed to rispiridal 5 days ago from zyprexa.   She has hx of CAD sp NSTEMI in 2004 and sp stent to LAD in 2007 last cath was in Jan 2012 showing       nonobstructive coronary artery disease and an ejection fraction of 65%. She is diabetic. She also has history of atypical chest pains, bipolar disorder and opioid dependency.   Review of Systems:    Pertinent positives include:shortness of breath at rest. chest pain, nausea,  Constitutional:  No weight loss, night sweats, Fevers, chills, fatigue.  HEENT:  No headaches, Difficulty swallowing,Tooth/dental problems,Sore throat,    No sneezing, itching, ear ache, nasal congestion, post nasal drip,  Cardio-vascular:  No  Orthopnea, PND, anasarca, dizziness, palpitations.no Bilateral lower extremity swelling  GI:  No heartburn, indigestion, abdominal pain,  vomiting, diarrhea, change in bowel habits, loss of appetite, melena, blood in stool, hematoemesis Resp:  no  No dyspnea on exertion, No excess mucus, no productive cough, No non-productive cough, No coughing up of blood.No change in color of mucus.No wheezing.No chest wall deformity  Skin:  no rash or lesions.  GU:  no dysuria, change in color of urine, no urgency or frequency. No flank pain.  Musculoskeletal:  No joint pain or swelling. No decreased range of motion. No back pain.  Psych:  No change in mood or affect. No depression or anxiety. No memory loss.  Neuro: no localizing neurological complaints, no tingling, no weakness, no double vision, no gait abnormality, no slurred speech   Otherwise ROS are negative except for above, 10 systems were reviewed  Past Medical History: Past Medical History  Diagnosis Date  . Anemia, unspecified   . Major depressive disorder, single episode, unspecified   . Other, mixed, or unspecified nondependent drug abuse, unspecified   . Bipolar disorder, unspecified   . Dissociative identity disorder   . Unspecified essential hypertension   . Other and unspecified hyperlipidemia   . Esophageal reflux   . Coronary atherosclerosis of unspecified type of vessel, native or graft   . Diabetes mellitus   . Hiatal hernia   . Asthma   . Chronic airway obstruction, not elsewhere classified     PT. DENIES  . Myocardial infarction    Past  Surgical History  Procedure Date  . Cesarean section   . Bladder surgery     Tack  . Nephrectomy     Part of left kidney  . Tonsillectomy and adenoidectomy   . Carpal tunnel release     BILATERAL HANDS     Medications: Prior to Admission medications   Medication Sig Start Date End  Date Taking? Authorizing Provider  aspirin 81 MG tablet Take 81 mg by mouth daily. 2 tablets daily     Historical Provider, MD  budesonide-formoterol (SYMBICORT) 80-4.5 MCG/ACT inhaler Inhale 2 puffs into the lungs 2 (two) times daily. 06/01/11 05/31/12  Marca Ancona, MD  busPIRone (BUSPAR) 30 MG tablet Take 12 tablets by mouth Daily. 05/03/11   Historical Provider, MD  DULoxetine (CYMBALTA) 60 MG capsule Take 60 mg by mouth daily.      Historical Provider, MD  esomeprazole (NEXIUM) 40 MG capsule Take 1 capsule (40 mg total) by mouth 2 (two) times daily. 08/18/11   Yancey Flemings, MD  furosemide (LASIX) 40 MG tablet Take 1 tablet (40 mg total) by mouth daily. 06/10/11   Marca Ancona, MD  lisinopril (PRINIVIL,ZESTRIL) 5 MG tablet Take 5 mg by mouth daily.      Historical Provider, MD  metFORMIN (GLUCOPHAGE) 500 MG tablet Take 500 mg by mouth daily with breakfast.      Historical Provider, MD  metoprolol (TOPROL XL) 50 MG 24 hr tablet Take 50 mg by mouth. 1 1/2 tablet daily     Historical Provider, MD  nitroGLYCERIN (NITROSTAT) 0.4 MG SL tablet Place 0.4 mg under the tongue every 5 (five) minutes as needed.      Historical Provider, MD  OLANZapine (ZYPREXA) 20 MG tablet Take 2 tablets by mouth Daily. 05/28/11   Historical Provider, MD  Potassium Chloride (KLOR-CON 10 PO) Take 4 tablets by mouth daily.     Historical Provider, MD  PROAIR HFA 108 (90 BASE) MCG/ACT inhaler  12/29/10   Historical Provider, MD  rosuvastatin (CRESTOR) 40 MG tablet Take 40 mg by mouth daily.      Historical Provider, MD    Allergies:   Allergies  Allergen Reactions  . Cefpodoxime Proxetil   . Cephalexin     REACTION: unspecified  . Erythromycin Ethylsuccinate     REACTION: unspecified  . Trazodone Hcl     REACTION: unspecified    Social History:  Ambulatory independently Lives at home with family   reports that she quit smoking about 14 months ago. Her smoking use included Cigarettes. She quit after 40 years of use.  She has never used smokeless tobacco. She reports that she does not drink alcohol or use illicit drugs.   Family History: family history includes Emphysema in her mother; Heart attack (age of onset:30) in her brother and sister; Heart disease in her father; and Ovarian cancer in her mother.  There is no history of Colon cancer.    Physical Exam: Patient Vitals for the past 24 hrs:  BP Temp Temp src Pulse Resp SpO2  09/13/11 0425 155/103 mmHg - Oral 55  18  98 %  09/13/11 0300 162/93 mmHg - - 97  16  96 %  09/13/11 0215 156/91 mmHg - - 101  19  93 %  09/13/11 0214 156/91 mmHg - - 82  18  98 %  09/13/11 0045 120/74 mmHg - - 105  17  96 %  09/12/11 2327 141/62 mmHg 98.3 F (36.8 C) Oral 136  20  92 %  1. General:  in No Acute distress 2. Psychological: Alert and Oriented 3. Head/ENT:   Moist  Mucous Membranes                          Head Non traumatic, neck supple                          Normal  Dentition 4. SKIN: normal Skin turgor,  Skin clean Dry and intact no rash 5. Heart: Regular rate and rhythm no Murmur, Rub or gallop 6. Lungs: Clear to auscultation bilaterally, occasional  wheezes no crackles   7. Abdomen: Soft, non-tender, Non distended 8. Lower extremities: no clubbing, cyanosis, trace edema bilateraly 9. Neurologically Grossly intact, moving all 4 extremities equally 10. MSK: Normal range of motion  body mass index is unknown because there is no height or weight on file.   Labs on Admission:   [facility] 09/13/11 0004  NA 141  K 3.3*  CL 104  CO2 --  GLUCOSE 134*  BUN 10  CREATININE 0.70  CALCIUM --  MG --  PHOS --   No results found for this basename: AST:2,ALT:2,ALKPHOS:2,BILITOT:2,PROT:2,ALBUMIN:2 in the last 72 hours No results found for this basename: LIPASE:2,AMYLASE:2 in the last 72 hours  Basename 09/13/11 0004 09/12/11 2340  WBC -- 9.8  NEUTROABS -- 5.4  HGB 14.3 13.6  HCT 42.0 40.7  MCV -- 99.0  PLT -- 229    Basename 09/12/11 2342    CKTOTAL --  CKMB --  CKMBINDEX --  TROPONINI <0.30   No results found for this basename: TSH,T4TOTAL,FREET3,T3FREE,THYROIDAB in the last 72 hours No results found for this basename: VITAMINB12:2,FOLATE:2,FERRITIN:2,TIBC:2,IRON:2,RETICCTPCT:2 in the last 72 hours Lab Results  Component Value Date   HGBA1C 6.1* 05/19/2011    The CrCl is unknown because both a height and weight (above a minimum accepted value) are required for this calculation. ABG    Component Value Date/Time   PHART 7.390 09/28/2008 0500   HCO3 28.0* 09/28/2008 0500   TCO2 27 09/13/2011 0004   O2SAT 92.4 09/28/2008 0500     Lab Results  Component Value Date   DDIMER 0.51* 09/12/2011     Other results:  I have pearsonaly reviewed this: ECG REPORT  Rate: 130   Rhythm: sinus tachycardia ST&T Change: diffuse ST seg depressions could be rate related.  Repeat ECG done and showing resolution of ST segment changes with improved heart rate.   Cultures:    Component Value Date/Time   SDES URINE, CATHETERIZED 05/11/2009 0825   SPECREQUEST NONE 05/11/2009 0825   CULT NO GROWTH 05/11/2009 0825   REPTSTATUS 05/12/2009 FINAL 05/11/2009 0825      Radiological Exams on Admission: Ct Angio Chest W/cm &/or Wo Cm  09/13/2011  *RADIOLOGY REPORT*  Clinical Data: Shortness of breath and mid chest pain; hypoxia, tachycardia and dyspnea.  Elevated D-dimer.  History of diabetes.  CT ANGIOGRAPHY CHEST  Technique:  Multidetector CT imaging of the chest using the standard protocol during bolus administration of intravenous contrast. Multiplanar reconstructed images including MIPs were obtained and reviewed to evaluate the vascular anatomy.  Contrast: OMNIPAQUE IOHEXOL 350 MG/ML IV SOLN  Comparison: Chest radiograph performed earlier today at 12:03 a.m., and CTA of the chest performed 09/27/2008  Findings: There is no evidence of pulmonary embolus.  Minimal atelectasis is noted within the right middle lobe.  The lungs are otherwise  clear.  There is no  evidence of significant focal consolidation, pleural effusion or pneumothorax.  No masses are identified; no abnormal focal contrast enhancement is seen.  The mediastinum is unremarkable in appearance.  There is no evidence of mediastinal lymphadenopathy.  No pericardial effusion is identified.  The great vessels are unremarkable in appearance. No axillary lymphadenopathy is seen.  The thyroid gland is unremarkable in appearance.  The visualized portions of the liver and spleen are unremarkable. Numerous small cysts are noted at the upper pole of the left kidney; these are slightly more apparent than on the prior CTA.  No acute osseous abnormalities are seen.  IMPRESSION:  1.  No evidence of pulmonary embolus. 2.  Minimal atelectasis within the right middle lobe; lungs otherwise clear. 3.  Numerous small left renal cysts noted, mildly more apparent than on the prior CTA.  Original Report Authenticated By: Tonia Ghent, M.D.   Dg Chest Port 1 View  09/13/2011  *RADIOLOGY REPORT*  Clinical Data: Chest pain.  PORTABLE CHEST - 1 VIEW  Comparison: 05/18/2011  Findings: Single view of the chest was obtained.  Patient is rotated on this examination.  No focal airspace disease or edema. Heart size is grossly stable.  IMPRESSION: No acute chest findings.  Original Report Authenticated By: Richarda Overlie, M.D.    Assessment/Plan  56 yo with somewhat atypical chest pain with hx of CAD, DM, HTN and noncompliance presented with tachycardia, dyspnea with CTA neg for PE.   Present on Admission:  .Chest pain - somewhat atypical, will repeat ecg, monitor on telemetry, cycle cardiac enzymes, obtain serial ECG. Further risk stratify with lipid panel, hgA1C, obtain TSH. Make sure patient is on Aspirin. Further treatment based on the currently pending results.  Obtain echo to evaluate for wall motion abnormality. .Hypoxia - patient has hx of COPD, and sleep apnea. No evidence of PE or PNA on CTA. She may need  O2 with exertion. Would also restrt her lasix .CORONARY HEART DISEASE - cycle ce, restart statin, would let cardiology know in am she is here especially given rate related changes on ECG .C O P D - patient has self discontinued her inhalers, would benefit from Atrovent and xopenex, some dyspnea likely explained by COPD .Hypokalemia - replace .Tachycardia - possibly induced by very large intake of caffeine but also will check TSh and make sure she is taking metoprolol Diabetes - on SSI, hold metformin  Prophylaxis:  Lovenox, Protonix  CODE STATUS:FULL CODE   Evoleth Nordmeyer 09/13/2011, 5:12 AM

## 2011-09-13 NOTE — Progress Notes (Signed)
09/13/2011 3:53 PM Nursing Note:  Discharge AVS, RX, medications already taken today given and explained to patient and family. Consulted pharmacy safe timing for resuming metoprol xl. Follow up appointments discussed. D/c iv/ d/c tele. D/c home per orders.  Ryler Laskowski, Blanchard Kelch

## 2011-09-13 NOTE — ED Notes (Addendum)
Called and gave report to [place].

## 2011-09-13 NOTE — Discharge Summary (Signed)
Patient ID: [name] MRN: [id number] DOB/AGE: 1956-07-07 56 y.o.  Admit date: 09/12/2011 Discharge date: 09/13/2011  Primary Care Physician:  Garth Schlatter, MD, MD  Discharge Diagnoses:     Principal Problem:  *Chest pain likely musculoskeletal  Active Problems:  Uncontrolled hypertension  DEPRESSION, MAJOR  HYPERLIPIDEMIA  BIPOLAR DISORDER UNSPECIFIED  CORONARY HEART DISEASE  C O P D  OSA (obstructive sleep apnea)  Hypokalemia  Tachycardia   Medication List  As of 09/13/2011  3:04 PM   TAKE these medications         albuterol 90 MCG/ACT inhaler   Commonly known as: PROVENTIL,VENTOLIN   Inhale 2 puffs into the lungs every 6 (six) hours as needed for wheezing.      aspirin 81 MG tablet   Take 81 mg by mouth daily. 2 tablets daily      budesonide-formoterol 160-4.5 MCG/ACT inhaler   Commonly known as: SYMBICORT   Inhale 2 puffs into the lungs 2 (two) times daily.      DULoxetine 60 MG capsule   Commonly known as: CYMBALTA   Take 60 mg by mouth daily.      furosemide 40 MG tablet   Commonly known as: LASIX   Take 1 tablet (40 mg total) by mouth daily.      KLOR-CON 10 PO   Take 4 tablets by mouth daily.      lisinopril 20 MG tablet   Commonly known as: PRINIVIL,ZESTRIL   Take 1 tablet (20 mg total) by mouth daily.      metFORMIN 500 MG tablet   Commonly known as: GLUCOPHAGE   Take 500 mg by mouth daily with breakfast.      metoprolol succinate 50 MG 24 hr tablet   Commonly known as: TOPROL-XL   Take 2 tablets (100 mg total) by mouth daily. 1 1/2 tablet daily      nitroGLYCERIN 0.4 MG SL tablet   Commonly known as: NITROSTAT   Place 0.4 mg under the tongue every 5 (five) minutes as needed.      risperiDONE 2 MG tablet   Commonly known as: RISPERDAL   Take 2 mg by mouth at bedtime.      rosuvastatin 10 MG tablet   Commonly known as: CRESTOR   Take 1 tablet (10 mg total) by mouth daily at 6 PM.            Disposition and  Follow-up:  Follow up with PCP in 1 week'  follow up with cardiologist Dr Shirlee Latch in 1 week   Consults:   none  Significant Diagnostic Studies:  No results found.  Brief H and P: For complete details please refer to admission H and P, but in brief [name] is a 56 y.o. female With  medical history of Anemia, unspecified; Major depressive disorder, Presented with  Chest pain that occurred at rest tonight started around 9pm feels like pressure, heavy sensation. Started while in bed associated with some of SOB, nausea, palpitation. Non reproducible by palpation. Not improved by nitro given by EMS. She states she still have some dull ache and would like some tylenol for it. Recently have had a few Mountain dews, have been drinking large amount of coke and sweet tea. Recently have not sleped good staying up for days at a time. EMS checked BP at home and it was 220/160. Patient was very anxious per her family.   Physical Exam on Discharge:  Filed Vitals:   09/13/11 4098  09/13/11 0738 09/13/11 1148 09/13/11 1229  BP: 140/87  144/92   Pulse: 100   81  Temp: 98 F (36.7 C)     TempSrc: Oral     Resp: 19 18    SpO2: 97% 97%      No intake or output data in the 24 hours ending 09/13/11 1504  General: obese female in no acute distress. HEENT: no pallor, moist oral mucosa, No bruits, no goiter. Heart: Regular rate and rhythm, without murmurs, rubs, gallops. Lungs: Clear to auscultation bilaterally. Abdomen: Soft, nontender, nondistended, positive bowel sounds. Extremities: No clubbing cyanosis or edema with positive pedal pulses. Neuro: Grossly intact, nonfocal.  CBC:    Component Value Date/Time   WBC 7.9 09/13/2011 0648   HGB 12.7 09/13/2011 0648   HCT 39.2 09/13/2011 0648   PLT 205 09/13/2011 0648   MCV 100.0 09/13/2011 0648   NEUTROABS 5.4 09/12/2011 2340   LYMPHSABS 3.3 09/12/2011 2340   MONOABS 0.7 09/12/2011 2340   EOSABS 0.4 09/12/2011 2340   BASOSABS 0.0 09/12/2011  2340    Basic Metabolic Panel:    Component Value Date/Time   NA 139 09/13/2011 0648   K 3.3* 09/13/2011 0648   CL 103 09/13/2011 0648   CO2 29 09/13/2011 0648   BUN 9 09/13/2011 0648   CREATININE 0.97 09/13/2011 0648   GLUCOSE 118* 09/13/2011 0648   CALCIUM 9.0 09/13/2011 0648    Hospital Course:  Chest pain  appears atypical Currently asymptomatic and resolved  serial CE negative. EKG unremarkable and stable on tele  she was seen by her cardiologist Dr Shirlee Latch 1 year back  and had cardiac cath which was normal with patent previous LAD stent She was again seen in October 2012 for similar chest pain and ruled out Possibly musculskeletal in association with uncontrolled BP Mildly elevated d dimer on admission , so a CT angio was done and negative for PE Continue ASA, will increase toprol xl to 100 mg daily given elevated HR and BP. continue crestor  uncontrolled HTN BP of 220/160 mmhg when checked by EMS Will increase toprol xl to 100 mg daily. Increase lisinopril to 20 mg daily Needs to follow up with PCP and and repeat BP checked in 1 week Cont lasix    COPD  presented with SOB likely related to Chest pain  now stable  cont albuterol and symbicort at home   Hypokalemia  repleted  Remaining medical issues are stable   i spoke with her cardiologist Dr Shirlee Latch today and he agrees on discharging her home and will follow up with her as outpatient in 1-2 weeks  Time spent on Discharge: 25 minutes  Signed: Brianni Manthe 09/13/2011, 3:04 PM

## 2011-09-13 NOTE — Progress Notes (Signed)
Pt admitted to unit room 2035. Pt c/o chest pain. Gave pt morphine for relief. ce - cta chest neg. Will continue to monitor.

## 2011-09-13 NOTE — ED Notes (Signed)
Patient is resting comfortably. Family at bedside.  

## 2011-09-15 ENCOUNTER — Emergency Department (HOSPITAL_COMMUNITY)
Admission: EM | Admit: 2011-09-15 | Discharge: 2011-09-15 | Disposition: A | Discharge status: Home Health | Payer: PRIVATE HEALTH INSURANCE

## 2011-09-15 NOTE — ED Notes (Signed)
Patient saw her blood pressure and decided that she could leave.  So she left.

## 2011-10-05 ENCOUNTER — Encounter: Payer: PRIVATE HEALTH INSURANCE | Admitting: Cardiology

## 2011-10-26 ENCOUNTER — Encounter: Payer: PRIVATE HEALTH INSURANCE | Admitting: Cardiology

## 2011-11-06 ENCOUNTER — Encounter: Payer: PRIVATE HEALTH INSURANCE | Admitting: Cardiology

## 2011-12-17 ENCOUNTER — Telehealth: Payer: Self-pay | Admitting: Cardiology

## 2011-12-17 NOTE — Telephone Encounter (Signed)
Pt thinks one of her medications is making her dizzy and her b/p was 96/62 this morning

## 2011-12-17 NOTE — Telephone Encounter (Signed)
Agree with cutting Toprol XL in half and see how she does, have her call back in a few days.

## 2011-12-17 NOTE — Telephone Encounter (Signed)
Pt states has been experiencing severe dizziness since d/c from hospital--has fallen down several times--states no change in meds --is taking toprol succ. 100mg  daily--advised to try to cut dose in half and call us back in 3-4 days and let us know if it is better--also taking lisinopril 20mg  qd--advised i would  pass information along to dr Shirlee Latch to see if 50mg  of toprol is OK--PT AGREES

## 2011-12-22 ENCOUNTER — Telehealth: Payer: Self-pay | Admitting: Cardiology

## 2011-12-22 ENCOUNTER — Encounter: Payer: Self-pay | Admitting: Physician Assistant

## 2011-12-22 ENCOUNTER — Ambulatory Visit (INDEPENDENT_AMBULATORY_CARE_PROVIDER_SITE_OTHER): Payer: PRIVATE HEALTH INSURANCE | Admitting: Physician Assistant

## 2011-12-22 DIAGNOSIS — I959 Hypotension, unspecified: Secondary | ICD-10-CM

## 2011-12-22 DIAGNOSIS — I10 Essential (primary) hypertension: Secondary | ICD-10-CM

## 2011-12-22 DIAGNOSIS — I2510 Atherosclerotic heart disease of native coronary artery without angina pectoris: Secondary | ICD-10-CM

## 2011-12-22 DIAGNOSIS — R42 Dizziness and giddiness: Secondary | ICD-10-CM

## 2011-12-22 DIAGNOSIS — R55 Syncope and collapse: Secondary | ICD-10-CM

## 2011-12-22 MED ORDER — RANITIDINE HCL 150 MG PO TABS: 150.0000 mg | ORAL_TABLET | Freq: Two times a day (BID) | ORAL | Status: DC

## 2011-12-22 MED ORDER — METOPROLOL SUCCINATE ER 50 MG PO TB24: ORAL_TABLET | ORAL | Status: DC

## 2011-12-22 MED ORDER — LISINOPRIL 10 MG PO TABS: 10.0000 mg | ORAL_TABLET | Freq: Every day | ORAL | Status: DC

## 2011-12-22 MED ORDER — METOPROLOL SUCCINATE ER 25 MG PO TB24: ORAL_TABLET | ORAL | Status: DC

## 2011-12-22 MED ORDER — FUROSEMIDE 40 MG PO TABS: ORAL_TABLET | ORAL | Status: DC

## 2011-12-22 NOTE — Patient Instructions (Addendum)
Your physician recommends that you schedule a follow-up appointment in: 12/24/11 WITH SCOTT WEAVER, PAC   NO DRIVING FOR NOW UNTIL FURTHER ADVISED BY CARDIOLOGY  Your physician has recommended you make the following change in your medication:  METOPROLOL 25 MG DAILY HOLD LASIX AND POTASSIUM ...Marland KitchenMarland KitchenONLY TAKE AS NEEDED FOR SWELLING DECREASE LISINOPRIL 10 MG DAILY START ZANTAC OTC 150 MG TWICE DAILY  TODAY LABS: BMET, CB W/DIFF, TSH, LFT  Your physician has requested that you have an echocardiogram DX SYNCOPE/DIZZINESS. Echocardiography is a painless test that uses sound waves to create images of your heart. It provides your doctor with information about the size and shape of your heart and how well your heart's chambers and valves are working. This procedure takes approximately one hour. There are no restrictions for this procedure. 12/24/11 @ 3 PM     12/22/11--IV right antecubital discontinued at 6:30PM 12/22/11.  Pt received approximately  900 cc NS. Pt 's BP at 6:30pm 12/22/11  106/60. Pt stated she was feeling better. Pt taken in wheelchair to vehicle to be taken home with husband driving. Luana Shu

## 2011-12-22 NOTE — Telephone Encounter (Signed)
Spoke with pt. Pt states she decrease Toprol XL from 50mg  bid to 50mg  am only on Friday 12/18/11. On Saturday pt states while walking down the hall in her home she felt dizzy and she fell straight forward. She wasn't out long. Pt states her BP was 90/50 shortly after that. BP has stayed in the 90/50-60 range since Friday. She is unsure of her heart rate.  Pt feels dizzy and weak. Pt states she is staying well hydrated.  I will forward to Dr Shirlee Latch for review.

## 2011-12-22 NOTE — Progress Notes (Signed)
[address] [place], [place]  [address] Phone: [phone] Fax:  [phone]  Date:  12/22/2011   Name:  [name]   DOB:  Nov 16, 1955   MRN:  [id number]  PCP:  Garth Schlatter, MD, MD  Primary Cardiologist:  Dr. Marca Ancona  Primary Electrophysiologist:  None    History of Present Illness: [name] is a 56 y.o. female who returns for evaluation of syncope and dizziness.  She has a history of CAD and COPD.  Admitted in 10/12 with very atypical chest pain. She was ruled out for MI and discharged home.  Last seen by Dr. Shirlee Latch 05/2011.  She has symptoms concerning for sleep apnea and a sleep study was ordered.  2-D echocardiogram was also ordered for dysnea.  I do not see results for either of these.  Since that time, she was admitted in 08/2011 with recurrent chest pain.  CT of the chest was negative for pulmonary embolism.  Her Toprol was increased.  Lisinopril was also increased due to uncontrolled blood pressure.  It was felt that her chest pain was related to COPD.  She called in last week with complaints of dizziness.  Her Toprol was decreased.  Called in today with blood pressures in the 90/50 range.   She also reported dizziness and a syncopal episode.    She was admitted for depression in [facility] Last month.  She had several medication changes.  Perphenazine benztropine as well as Wellbutrin were added to her medications.  Since that time, she has had difficulty with her blood pressure.  Her blood pressure has been ranging 90-100 systolically.  She notes dizziness with standing.  She was walking in the hallway last week and became near syncopal, fell to her knees and eventually passed out.  She denies any chest pain.  Overall, her dyspnea is improved.  She does report a 30 pound weight loss.  Her appetite has been poor.  She has occasional indigestion.  She used to take Nexium, now she takes Zantac.  She denies orthopnea, PND or  edema.  Wt Readings from Last 3 Encounters:  12/22/11 221 lb 12.8 oz (100.608 kg)  06/01/11 253 lb (114.76 kg)  01/26/11 249 lb (112.946 kg)     Potassium  Date/Time Value Range Status  09/13/2011  6:48 AM 3.3* 3.5-5.1 (mEq/L) Final     Creatinine, Ser  Date/Time Value Range Status  09/13/2011  6:48 AM 0.97  0.50-1.10 (mg/dL) Final     ALT  Date/Time Value Range Status  09/13/2011  6:48 AM 24  0-35 (U/L) Final     TSH  Date/Time Value Range Status  09/13/2011  6:48 AM 3.253  0.350-4.500 (uIU/mL) Final     Hemoglobin  Date/Time Value Range Status  09/13/2011  6:48 AM 12.7  12.0-15.0 (g/dL) Final    Past Medical History:  1. 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 DEPRESSION, MAJOR (ICD-296.20)  3. Hx of NARCOTIC ABUSE (ICD-305.90)  4. BIPOLAR DISORDER UNSPECIFIED (ICD-296.80)  5. MULTIPLE PERSONALITY DISORDER (ICD-300.14)  6. HYPERTENSION (ICD-401.9)  7. HYPERLIPIDEMIA (ICD-272.4)  8. G E R D (ICD-530.81)  9. C O P D (ICD-496): Quit smoking in 12/11. PFTs (4/10): FEV1 66%, ratio 64%, DLCO 67%.  10. CORONARY HEART DISEASE (ICD-414.00): Cypher DES to mid LAD in 2007. Myoview 3/09 with EF 72%, no ischemia or infarction. Myoview (12/11) EF 73%, suspected anterior soft tissue attenuation with no ischemia or infarction. Left heart cath (1/12): patent LAD  stent, mild LIs only.  11. Partial nephrectomy 1997  12. Diabetes mellitus  13. Echo (1/08): EF 65%, no significant valvular abnormalities. Echo (12/11): EF 55-60%, grade II diastolic dysfunction, normal wall motion, mild left atrial enlargement.  14. Palpitations: Holter (12/11) with occasional PVCs. Holter (11/12): rare PACs and PVCs.  15. Esophageal stricture s/p dilatation in 7/12.  16. Obesity.    Current Outpatient Prescriptions  Medication Sig Dispense Refill  . albuterol (PROVENTIL,VENTOLIN) 90 MCG/ACT inhaler Inhale 2 puffs into the lungs every 6 (six) hours as needed for wheezing.  17 g  12  . aspirin 81 MG chewable  tablet Chew 162 mg by mouth daily.      . budesonide-formoterol (SYMBICORT) 160-4.5 MCG/ACT inhaler Inhale 2 puffs into the lungs 2 (two) times daily.  1 Inhaler  12  . DULoxetine (CYMBALTA) 60 MG capsule Take 60 mg by mouth daily.       . furosemide (LASIX) 40 MG tablet Take 1 tablet (40 mg total) by mouth daily.  30 tablet  2  . lisinopril (PRINIVIL,ZESTRIL) 20 MG tablet Take 1 tablet (20 mg total) by mouth daily.  30 tablet  2  . metFORMIN (GLUCOPHAGE) 500 MG tablet Take 500 mg by mouth daily with breakfast.        . metoprolol succinate (TOPROL XL) 50 MG 24 hr tablet Take 2 tablets (100 mg total) by mouth daily. 1 1/2 tablet daily  60 tablet  2  . nitroGLYCERIN (NITROSTAT) 0.4 MG SL tablet Place 0.4 mg under the tongue every 5 (five) minutes as needed. For chest pain      . Potassium Chloride (KLOR-CON 10 PO) Take 4 tablets by mouth daily.       . risperiDONE (RISPERDAL) 2 MG tablet Take 2 mg by mouth at bedtime.      . rosuvastatin (CRESTOR) 10 MG tablet Take 1 tablet (10 mg total) by mouth daily at 6 PM.  30 tablet  2    Allergies: Allergies  Allergen Reactions  . Cefpodoxime Proxetil Other (See Comments)    unknown  . Cephalexin     REACTION: unspecified  . Erythromycin Ethylsuccinate     REACTION: unspecified  . Trazodone Hcl     REACTION: unspecified    History  Substance Use Topics  . Smoking status: Former Smoker -- 40 years    Types: Cigarettes    Quit date: 07/07/2010  . Smokeless tobacco: Never Used  . Alcohol Use: No     ROS:  Please see the history of present illness.   She denies fevers, chills, cough, melena, hematochezia, vomiting, diarrhea.  All other systems reviewed and negative.   PHYSICAL EXAM: VS:  BP 100/62  Pulse 76  Ht 5\' 6"  (1.676 m)  Wt 221 lb 12.8 oz (100.608 kg)  BMI 35.80 kg/m2  Orthostatic vital signs: Blood pressure 100/62, pulse 74; sitting blood pressure 98/60, pulse 84; standing blood pressure 101/68, pulse 92 after 2 minutes blood  pressure 110/70, heart rate 92, after 5 minutes blood pressure 110/74, heart rate 98  Well nourished, well developed, in no acute distress HEENT: normal Neck: no JVD Endocrine: No thyromegaly Cardiac:  normal S1, S2; RRR; no murmur Lungs:  clear to auscultation bilaterally, no wheezing, rhonchi or rales Abd: soft, nontender, no hepatomegaly Ext: no edema Skin: warm and dry Neuro:  CNs 2-12 intact, no focal abnormalities noted Site: Flat affect  EKG:  Sinus rhythm, heart rate 76, nonspecific ST-T wave changes, QTc 456 ms  ASSESSMENT AND PLAN:  1.  Hypotension Originally, I planned to cut her Lasix down to as needed and cut her lisinopril in half.  We had her go to the lab to draw a basic metabolic panel, CBC, TSH and LFTs. In the lab, she developed near syncope.  We brought her back to her room again and checked her blood pressure and it was 70 by palpation. We kept her in the office to give her 1 L of normal saline.  BP came up to 100 systolic while sitting. Dr. Shirlee Latch also saw the patient.  We have reviewed her medications.  Benztropine, perphenazine and Wellbutrin are new. She has had difficulty with her blood pressure since that time. Perphenazine is the likely culprit for her blood pressure issues. She has been advised to contact her psychiatrist tomorrow for early followup.  Question if perphenazine can be discontinued. For now, we will discontinue her lisinopril, Lasix and potassium. I will cut her Toprol back to 25 mg daily and she will restart tomorrow evening. We will go ahead and arrange her echocardiogram for followup on her LV function. She will see me back later this week.  2.  Syncope As outlined above. No driving for now.  3.  Depression She has been advised to follow up with psychiatrist this week. Question if she can stop her perphenazine.  This will be per her psychiatrist (Dr. Betti Cruz).  4.  Coronary Artery Disease She is not having angina.  She had a  cardiac catheterization in 07/2010.  Continue aspirin and statin.    Signed, Tereso Newcomer, PA-C  3:29 PM 12/22/2011

## 2011-12-22 NOTE — Telephone Encounter (Signed)
Pt has questions regarding her b/p pt has been really dizzy and she passed out Friday

## 2011-12-22 NOTE — Telephone Encounter (Signed)
Spoke with pt. She will come to see Tereso Newcomer today at [facility]. I advised pt not to drive.

## 2011-12-22 NOTE — Telephone Encounter (Signed)
Please have her see PA today.

## 2011-12-22 NOTE — Telephone Encounter (Signed)
She phone note dated 12/22/11.

## 2011-12-24 ENCOUNTER — Ambulatory Visit (HOSPITAL_COMMUNITY): Payer: PRIVATE HEALTH INSURANCE | Attending: Cardiovascular Disease

## 2011-12-24 ENCOUNTER — Other Ambulatory Visit: Payer: Self-pay

## 2011-12-24 ENCOUNTER — Ambulatory Visit (INDEPENDENT_AMBULATORY_CARE_PROVIDER_SITE_OTHER): Payer: PRIVATE HEALTH INSURANCE | Admitting: Physician Assistant

## 2011-12-24 ENCOUNTER — Encounter: Payer: Self-pay | Admitting: Physician Assistant

## 2011-12-24 VITALS — BP 107/81 | HR 84 | Ht 66.0 in | Wt 221.0 lb

## 2011-12-24 DIAGNOSIS — J449 Chronic obstructive pulmonary disease, unspecified: Secondary | ICD-10-CM | POA: Insufficient documentation

## 2011-12-24 DIAGNOSIS — F319 Bipolar disorder, unspecified: Secondary | ICD-10-CM | POA: Insufficient documentation

## 2011-12-24 DIAGNOSIS — R42 Dizziness and giddiness: Secondary | ICD-10-CM

## 2011-12-24 DIAGNOSIS — R531 Weakness: Secondary | ICD-10-CM

## 2011-12-24 DIAGNOSIS — I10 Essential (primary) hypertension: Secondary | ICD-10-CM | POA: Insufficient documentation

## 2011-12-24 DIAGNOSIS — R55 Syncope and collapse: Secondary | ICD-10-CM | POA: Insufficient documentation

## 2011-12-24 DIAGNOSIS — E119 Type 2 diabetes mellitus without complications: Secondary | ICD-10-CM | POA: Insufficient documentation

## 2011-12-24 DIAGNOSIS — I2510 Atherosclerotic heart disease of native coronary artery without angina pectoris: Secondary | ICD-10-CM

## 2011-12-24 DIAGNOSIS — E785 Hyperlipidemia, unspecified: Secondary | ICD-10-CM | POA: Insufficient documentation

## 2011-12-24 DIAGNOSIS — R5383 Other fatigue: Secondary | ICD-10-CM

## 2011-12-24 DIAGNOSIS — K219 Gastro-esophageal reflux disease without esophagitis: Secondary | ICD-10-CM | POA: Insufficient documentation

## 2011-12-24 DIAGNOSIS — J4489 Other specified chronic obstructive pulmonary disease: Secondary | ICD-10-CM | POA: Insufficient documentation

## 2011-12-24 DIAGNOSIS — I951 Orthostatic hypotension: Secondary | ICD-10-CM

## 2011-12-24 NOTE — Progress Notes (Signed)
[address] [place], [place]  [address] Phone: [phone] Fax:  [phone]  Date:  12/24/2011   Name:  [name]   DOB:  12/10/1955   MRN:  [id number]  PCP:  Garth Schlatter, MD, MD  Primary Cardiologist:  Dr. Marca Ancona  Primary Electrophysiologist:  None    History of Present Illness: [name] is a 56 y.o. female who returns for follow up on syncope and dizziness.  She has a history of CAD, s/p Cypher stent to mLAD in 2007, COPD, HTN, HL, GERD, Depression.  Last myoview in 12/11 neg for ischemia.  Last LHC 11/12 with patent LAD stent and mild luminal irregs.  Admitted in 10/12 with very atypical chest pain. She was ruled out for MI and discharged home.  Last seen by Dr. Shirlee Latch 05/2011.   Admitted in 08/2011 with recurrent chest pain.  CT of the chest was negative for pulmonary embolism.  Her Toprol was increased.  Lisinopril was also increased due to uncontrolled blood pressure.    I saw her 2 days ago for orthostatic intol and syncope.  She was hypotensive in the office and we gave her 1L of IVFs.  I saw her with Dr. Marca Ancona and we stopped her Lasix, K+ and Lisinopril.  We cut her Toprol from 100 to 25 mg QD.   We thought her new psych meds were contributing to her low BP.  She was recently started on perphenazine, benztropine and Wellbutrin during an admission for depression.  She returns today feeling somewhat better.  She continues to have orthostatic intol.  She denies syncope.  No chest pain.  No orthopnea, PND or edema.  Her echo was done today.    2D Echocardiogram 12/24/2011:  Study Conclusions  - Left ventricle: The cavity size was normal. Wall thickness was increased in a pattern of mild LVH. Systolic function was normal.  The estimated ejection fraction was in the range of 55% to 60%. Wall motion was normal; there were no regional wall motion abnormalities. Doppler parameters are consistent with abnormal left  ventricular relaxation (grade 1 diastolic dysfunction). - Atrial septum: No defect or patent foramen ovale was identified.   Wt Readings from Last 3 Encounters:  12/24/11 221 lb (100.245 kg)  12/22/11 221 lb 12.8 oz (100.608 kg)  06/01/11 253 lb (114.76 kg)     Potassium  Date/Time Value Range Status  09/13/2011  6:48 AM 3.3* 3.5-5.1 (mEq/L) Final     Creatinine, Ser  Date/Time Value Range Status  09/13/2011  6:48 AM 0.97  0.50-1.10 (mg/dL) Final     ALT  Date/Time Value Range Status  09/13/2011  6:48 AM 24  0-35 (U/L) Final     TSH  Date/Time Value Range Status  09/13/2011  6:48 AM 3.253  0.350-4.500 (uIU/mL) Final     Hemoglobin  Date/Time Value Range Status  09/13/2011  6:48 AM 12.7  12.0-15.0 (g/dL) Final    Past Medical History:  1. 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 DEPRESSION, MAJOR (ICD-296.20)  3. Hx of NARCOTIC ABUSE (ICD-305.90)  4. BIPOLAR DISORDER UNSPECIFIED (ICD-296.80)  5. MULTIPLE PERSONALITY DISORDER (ICD-300.14)  6. HYPERTENSION (ICD-401.9)  7. HYPERLIPIDEMIA (ICD-272.4)  8. G E R D (ICD-530.81)  9. C O P D (ICD-496): Quit smoking in 12/11. PFTs (4/10): FEV1 66%, ratio 64%, DLCO 67%.  10. CORONARY HEART DISEASE (ICD-414.00): Cypher DES to mid LAD in 2007. Myoview 3/09 with EF 72%, no ischemia or infarction. Myoview (12/11)  EF 73%, suspected anterior soft tissue attenuation with no ischemia or infarction. Left heart cath (1/12): patent LAD stent, mild LIs only.  11. Partial nephrectomy 1997  12. Diabetes mellitus  13. Echo (1/08): EF 65%, no significant valvular abnormalities. Echo (12/11): EF 55-60%, grade II diastolic dysfunction, normal wall motion, mild left atrial enlargement.  14. Palpitations: Holter (12/11) with occasional PVCs. Holter (11/12): rare PACs and PVCs.  15. Esophageal stricture s/p dilatation in 7/12.  16. Obesity.    Current Outpatient Prescriptions  Medication Sig Dispense Refill  . albuterol (PROVENTIL,VENTOLIN) 90 MCG/ACT  inhaler Inhale 2 puffs into the lungs every 6 (six) hours as needed for wheezing.  17 g  12  . aspirin 81 MG chewable tablet Chew 162 mg by mouth daily.      . budesonide-formoterol (SYMBICORT) 160-4.5 MCG/ACT inhaler Inhale 2 puffs into the lungs 2 (two) times daily.  1 Inhaler  12  . DULoxetine (CYMBALTA) 60 MG capsule Take 60 mg by mouth daily.       . furosemide (LASIX) 40 MG tablet HOLD........TAKE ONLY AS NEEDED FOR SWELLING      . lisinopril (PRINIVIL,ZESTRIL) 10 MG tablet HOLD UNTIL FURTHER ADVISED BY CARDIOLOGY      . metFORMIN (GLUCOPHAGE) 500 MG tablet Take 500 mg by mouth daily with breakfast.        . metoprolol succinate (TOPROL XL) 25 MG 24 hr tablet 1 TABLET DAILY  30 tablet  11  . nitroGLYCERIN (NITROSTAT) 0.4 MG SL tablet Place 0.4 mg under the tongue every 5 (five) minutes as needed. For chest pain      . potassium chloride (KLOR-CON 10) 10 MEQ tablet HOLD.......ONLY TAKE AS NEEDED WHEN YOU TAKE THE LASIX AS NEEDED FOR SWELLING      . ranitidine (ZANTAC) 150 MG tablet Take 1 tablet (150 mg total) by mouth 2 (two) times daily.      . risperiDONE (RISPERDAL) 2 MG tablet Take 2 mg by mouth at bedtime.      . rosuvastatin (CRESTOR) 10 MG tablet Take 1 tablet (10 mg total) by mouth daily at 6 PM.  30 tablet  2    Allergies: Allergies  Allergen Reactions  . Cefpodoxime Proxetil Other (See Comments)    unknown  . Cephalexin     REACTION: unspecified  . Erythromycin Ethylsuccinate     REACTION: unspecified  . Trazodone Hcl     REACTION: unspecified    History  Substance Use Topics  . Smoking status: Former Smoker -- 40 years    Types: Cigarettes    Quit date: 07/07/2010  . Smokeless tobacco: Never Used  . Alcohol Use: No     PHYSICAL EXAM: VS:  BP 107/81  Pulse 84  Ht 5\' 6"  (1.676 m)  Wt 221 lb (100.245 kg)  BMI 35.67 kg/m2  Orthostatic VS: Lying   BP 122/80; HR 71 Sitting   BP 107/81; HR 84 Standing  BP 90/67; HR 91 2 mins  BP 97/74; HR 93 5 mins  BP  116/83; HR 89  Well nourished, well developed, in no acute distress HEENT: normal Neck: no JVD Cardiac:  normal S1, S2; RRR; no murmur Lungs:  clear to auscultation bilaterally, no wheezing, rhonchi or rales Abd: soft, nontender, no hepatomegaly Ext: no edema Skin: warm and dry Neuro:  CNs 2-12 intact, no focal abnormalities noted Psych: Flat affect  EKG:  Sinus rhythm, heart rate 73, ant T wave changes, QTc 456 ms, no change from prior  ASSESSMENT AND PLAN:  1.  Hypotension Improved but she is still orthostatic. Will get labs that were not drawn 2 days ago: bmet, cbc, tsh. Echo today was ok with good LVF. She has not heard from her psychiatrist.  I called Dr. Reddy's office.  He is not available until Monday.  I spoke with [facility].  I tried to explain the urgency of the situation.  I suspect the perphenazine is the culprit for her low BP.  She had difficult to control BP prior to starting this medication and has had low BPs since.  We need to see if her psychiatrist can change or stop this medication.  Ms. Pecola Leisure will be in touch with Dr. Betti Cruz for me.  2.  Syncope No recurrence. No driving for now.  3.  Depression As above. Question if she can stop her perphenazine.  This will be per her psychiatrist (Dr. Betti Cruz).  4.  Coronary Artery Disease She is not having angina.  She had a cardiac catheterization in 07/2010.  Echo today was normal aside from diastolic dysfunction.  Continue aspirin and statin.    Luna Glasgow, PA-C  4:29 PM 12/24/2011

## 2011-12-24 NOTE — Patient Instructions (Signed)
Your physician recommends that you schedule a follow-up appointment in: 01/04/12 @ 9:50 WITH SCOTT WEAVER, PAC SAME DAY DR. Shirlee Latch IS IN THE OFFICE  MAKE SURE TO TAKE TOPROL XL 25 MG IN THE EVENING WHEN GOING TO BED

## 2011-12-25 LAB — HEPATIC FUNCTION PANEL
ALT: 33 U/L (ref 0–35)
Albumin: 3.6 g/dL (ref 3.5–5.2)
Total Bilirubin: 0.9 mg/dL (ref 0.3–1.2)
Total Protein: 6.2 g/dL (ref 6.0–8.3)

## 2011-12-25 LAB — BASIC METABOLIC PANEL
CO2: 30 mEq/L (ref 19–32)
Chloride: 95 mEq/L — ABNORMAL LOW (ref 96–112)
Creatinine, Ser: 1.6 mg/dL — ABNORMAL HIGH (ref 0.4–1.2)

## 2011-12-25 LAB — CBC WITH DIFFERENTIAL/PLATELET
Basophils Relative: 0.3 % (ref 0.0–3.0)
Eosinophils Absolute: 0.3 10*3/uL (ref 0.0–0.7)
MCHC: 33.1 g/dL (ref 30.0–36.0)
MCV: 100.3 fl — ABNORMAL HIGH (ref 78.0–100.0)
Monocytes Absolute: 0.5 10*3/uL (ref 0.1–1.0)
Neutro Abs: 5.1 10*3/uL (ref 1.4–7.7)
Neutrophils Relative %: 58.2 % (ref 43.0–77.0)
RBC: 3.9 Mil/uL (ref 3.87–5.11)

## 2011-12-29 ENCOUNTER — Other Ambulatory Visit: Payer: Self-pay | Admitting: *Deleted

## 2012-01-04 ENCOUNTER — Telehealth: Payer: Self-pay | Admitting: *Deleted

## 2012-01-04 ENCOUNTER — Telehealth: Payer: Self-pay | Admitting: Physician Assistant

## 2012-01-04 ENCOUNTER — Ambulatory Visit (INDEPENDENT_AMBULATORY_CARE_PROVIDER_SITE_OTHER): Payer: PRIVATE HEALTH INSURANCE | Admitting: Physician Assistant

## 2012-01-04 ENCOUNTER — Encounter: Payer: Self-pay | Admitting: Physician Assistant

## 2012-01-04 VITALS — BP 125/86 | HR 99 | Ht 66.0 in | Wt 225.0 lb

## 2012-01-04 DIAGNOSIS — I2510 Atherosclerotic heart disease of native coronary artery without angina pectoris: Secondary | ICD-10-CM

## 2012-01-04 DIAGNOSIS — R42 Dizziness and giddiness: Secondary | ICD-10-CM

## 2012-01-04 DIAGNOSIS — I10 Essential (primary) hypertension: Secondary | ICD-10-CM

## 2012-01-04 DIAGNOSIS — R55 Syncope and collapse: Secondary | ICD-10-CM

## 2012-01-04 DIAGNOSIS — R9431 Abnormal electrocardiogram [ECG] [EKG]: Secondary | ICD-10-CM

## 2012-01-04 DIAGNOSIS — I959 Hypotension, unspecified: Secondary | ICD-10-CM

## 2012-01-04 LAB — BASIC METABOLIC PANEL
BUN: 10 mg/dL (ref 6–23)
CO2: 31 mEq/L (ref 19–32)
Chloride: 104 mEq/L (ref 96–112)
Creatinine, Ser: 1.4 mg/dL — ABNORMAL HIGH (ref 0.4–1.2)
Potassium: 3.9 mEq/L (ref 3.5–5.1)

## 2012-01-04 NOTE — Patient Instructions (Signed)
Your physician recommends that you schedule a follow-up appointment in: 01/25/12 WITH DR. Shirlee Latch  NO DRIVING   MAKE SURE TO DRINK PLENTY OF FLUIDS (WATER) TO STAY HYDRATED  TODAY BMET

## 2012-01-04 NOTE — Telephone Encounter (Signed)
Spoke with Dr. Marca Ancona regarding her recent symptoms. With hx of CAD, we want her to get a Lexiscan Myoview in the next 1-2 weeks. Keep f/u as planned with Dr. Marca Ancona. Tereso Newcomer, PA-C  4:59 PM 01/04/2012

## 2012-01-04 NOTE — Telephone Encounter (Signed)
lexiscan needs to be done in 1-2 weeks per Dr. Shirlee Latch and Bing Neighbors. PAC, repeat bmet order also placed to be done in 1 week

## 2012-01-04 NOTE — Progress Notes (Signed)
[address] [place], [place]  [address] Phone: [phone] Fax:  [phone]  Date:  01/04/2012   Name:  [name]   DOB:  1955-12-04   MRN:  [id number]  PCP:  Garth Schlatter, MD, MD  Primary Cardiologist:  Dr. Marca Ancona  Primary Electrophysiologist:  None    History of Present Illness: [name] is a 56 y.o. female who returns for follow up on syncope and dizziness.  She has a history of CAD, s/p Cypher stent to mLAD in 2007, COPD, HTN, HL, GERD, Depression.  Last myoview in 12/11 neg for ischemia.  Last LHC 1/12 with patent LAD stent and mild luminal irregs.  Admitted in 10/12 with very atypical chest pain. She was ruled out for MI and discharged home.  Last seen by Dr. Shirlee Latch 05/2011.   Admitted in 08/2011 with recurrent chest pain.  CT of the chest was negative for pulmonary embolism.  Her Toprol was increased.  Lisinopril was also increased due to uncontrolled blood pressure.    I have seen her a couple times over the last week for orthostatic intol and syncope.  She was hypotensive in the office and we gave her 1L of IVFs.  I saw her with Dr. Marca Ancona and we stopped her Lasix, K+ and Lisinopril.  We cut her Toprol from 100 to 25 mg QD.   We thought her new psych meds were contributing to her low BP, especially the perphenazine.  Echo 12/24/11: Mild LVH, EF 55-60%, grade 1 diastolic dysfunction.  Her labs demonstrated a creatinine of 1.6.  A low potassium.  This was supplemented.  She was already off of her Lasix and ACE inhibitor  She continues to feel fatigued.  She worked out in the heat yesterday for a couple of hours.  She denies drinking fluids.  When she stood up to walk inside, she felt near syncopal and then did pass out.  She denies any chest pain, shortness of breath, orthopnea, PND or edema.  She says her psychiatrist did cut back on her medication.  She does not see him for a couple of weeks.  Wt Readings from  Last 3 Encounters:  12/24/11 221 lb (100.245 kg)  12/22/11 221 lb 12.8 oz (100.608 kg)  06/01/11 253 lb (114.76 kg)     Potassium  Date/Time Value Range Status  12/24/2011  4:39 PM 3.4* 3.5-5.1 (mEq/L) Final     Creatinine, Ser  Date/Time Value Range Status  12/24/2011  4:39 PM 1.6* 0.4-1.2 (mg/dL) Final     ALT  Date/Time Value Range Status  12/24/2011  4:39 PM 33  0-35 (U/L) Final     TSH  Date/Time Value Range Status  12/24/2011  4:39 PM 1.87  0.35-5.50 (uIU/mL) Final     Hemoglobin  Date/Time Value Range Status  12/24/2011  4:39 PM 12.9  12.0-15.0 (g/dL) Final    Past Medical History:  1. 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 DEPRESSION, MAJOR (ICD-296.20)  3. Hx of NARCOTIC ABUSE (ICD-305.90)  4. BIPOLAR DISORDER UNSPECIFIED (ICD-296.80)  5. MULTIPLE PERSONALITY DISORDER (ICD-300.14)  6. HYPERTENSION (ICD-401.9)  7. HYPERLIPIDEMIA (ICD-272.4)  8. G E R D (ICD-530.81)  9. C O P D (ICD-496): Quit smoking in 12/11. PFTs (4/10): FEV1 66%, ratio 64%, DLCO 67%.  10. CORONARY HEART DISEASE (ICD-414.00): Cypher DES to mid LAD in 2007. Myoview 3/09 with EF 72%, no ischemia or infarction. Myoview (12/11) EF 73%, suspected anterior soft tissue attenuation with no  ischemia or infarction. Left heart cath (1/12): patent LAD stent, mild LIs only.  11. Partial nephrectomy 1997  12. Diabetes mellitus  13. Echo (1/08): EF 65%, no significant valvular abnormalities. Echo (12/11): EF 55-60%, grade II diastolic dysfunction, normal wall motion, mild left atrial enlargement.  Echo 12/24/11: Mild LVH, EF 55-60%, grade 1 diastolic dysfunction. 14. Palpitations: Holter (12/11) with occasional PVCs. Holter (11/12): rare PACs and PVCs.  15. Esophageal stricture s/p dilatation in 7/12.  16. Obesity.    Current Outpatient Prescriptions  Medication Sig Dispense Refill  . benztropine (COGENTIN) 1 MG tablet 1 mg.       Marland Kitchen buPROPion (WELLBUTRIN XL) 300 MG 24 hr tablet Take 300 mg by mouth every  morning.       . clonazePAM (KLONOPIN) 0.5 MG tablet Take 0.5 mg by mouth daily.       . DULoxetine (CYMBALTA) 60 MG capsule Take 60 mg by mouth daily.       Marland Kitchen lisinopril (PRINIVIL,ZESTRIL) 10 MG tablet HOLD UNTIL FURTHER ADVISED BY CARDIOLOGY      . metFORMIN (GLUCOPHAGE) 500 MG tablet Take 500 mg by mouth daily with breakfast.        . metoprolol succinate (TOPROL-XL) 25 MG 24 hr tablet 1 TABLET EVERY NIGHT      . nitroGLYCERIN (NITROSTAT) 0.4 MG SL tablet Place 0.4 mg under the tongue every 5 (five) minutes as needed. For chest pain      . perphenazine (TRILAFON) 8 MG tablet 8 mg.       . ranitidine (ZANTAC) 150 MG tablet Take 1 tablet (150 mg total) by mouth 2 (two) times daily.      Marland Kitchen albuterol (PROVENTIL,VENTOLIN) 90 MCG/ACT inhaler Inhale 2 puffs into the lungs every 6 (six) hours as needed for wheezing.  17 g  12  . aspirin 81 MG chewable tablet Chew 162 mg by mouth daily.      . budesonide-formoterol (SYMBICORT) 160-4.5 MCG/ACT inhaler Inhale 2 puffs into the lungs 2 (two) times daily.  1 Inhaler  12    Allergies: Allergies  Allergen Reactions  . Cefpodoxime Proxetil Other (See Comments)    unknown  . Cephalexin     REACTION: unspecified  . Erythromycin Ethylsuccinate     REACTION: unspecified  . Trazodone Hcl     REACTION: unspecified    History  Substance Use Topics  . Smoking status: Former Smoker -- 40 years    Types: Cigarettes    Quit date: 07/07/2010  . Smokeless tobacco: Never Used  . Alcohol Use: No     PHYSICAL EXAM: VS:  BP 132/92  Pulse 82  Ht 5\' 6"  (1.676 m)  Wt 225 lb (102.059 kg)  BMI 36.32 kg/m2  Filed Vitals:   01/04/12 1206 01/04/12 1207 01/04/12 1208 01/04/12 1209  BP: 113/85 111/77 123/85 125/86  Pulse: 88 96 85 99  Height:      Weight:         Well nourished, well developed, in no acute distress HEENT: normal Neck: no JVD Cardiac:  normal S1, S2; RRR; no murmur Lungs:  clear to auscultation bilaterally, no wheezing, rhonchi or  rales Abd: soft, nontender, no hepatomegaly Ext: no edema Skin: warm and dry Neuro:  CNs 2-12 intact, no focal abnormalities noted Psych: Flat affect  EKG:   Sinus rhythm, heart rate 82, no change from prior tracings    ASSESSMENT AND PLAN:  1.  Hypotension Improved.  She did have a syncopal spell yesterday.  I  suspect she was dehydrated.  Orthostatic vital signs today without significant changes from lying to standing.  Her heart rate does increase somewhat and her blood pressure does drop somewhat from lying to sitting.  I have asked her to continue to push fluids.  No change in her medications.  Keep followup with Dr. Shirlee Latch as scheduled.  2.  Syncope As above. No driving for now.  3.  Depression Her psychiatrist has adjusted her medication.  She has followup soon.  4.  Coronary Artery Disease She is not having angina.  She had a cardiac catheterization in 07/2010.  Recent echo normal aside from diastolic dysfunction.  Continue aspirin and statin. I discussed her case with Dr. Marca Ancona today after she left the office and we reviewed her ECGs.  She does have some subtle TW changes.  In light of her recent symptoms, we will get a stress Myoview.  Keep follow up with Dr. Shirlee Latch 7/1.   Signed, Tereso Newcomer, PA-C  10:31 AM 01/04/2012

## 2012-01-04 NOTE — Telephone Encounter (Signed)
pt notified of lab results and to needs to have a lexiscan done in the next 1-2 weeks per Dr. Arva Chafe and Tereso Newcomer, [facility], pt gave verbal understanding today

## 2012-01-05 ENCOUNTER — Other Ambulatory Visit: Payer: PRIVATE HEALTH INSURANCE

## 2012-01-11 ENCOUNTER — Other Ambulatory Visit: Payer: Self-pay | Admitting: *Deleted

## 2012-01-11 ENCOUNTER — Telehealth: Payer: Self-pay | Admitting: Cardiology

## 2012-01-11 NOTE — Telephone Encounter (Signed)
Husband calling us to let us know psychiatric med has been d/ced--trilafon--husband is hoping this will help

## 2012-01-11 NOTE — Telephone Encounter (Signed)
Please return call to patient husband Maurine Minister at [phone] regarding patient medication dosage.

## 2012-01-13 ENCOUNTER — Other Ambulatory Visit: Payer: PRIVATE HEALTH INSURANCE

## 2012-01-14 ENCOUNTER — Other Ambulatory Visit (INDEPENDENT_AMBULATORY_CARE_PROVIDER_SITE_OTHER): Payer: PRIVATE HEALTH INSURANCE

## 2012-01-14 ENCOUNTER — Ambulatory Visit (HOSPITAL_COMMUNITY): Payer: PRIVATE HEALTH INSURANCE | Attending: Cardiology | Admitting: Radiology

## 2012-01-14 VITALS — BP 150/91 | Ht 66.0 in | Wt 222.0 lb

## 2012-01-14 DIAGNOSIS — R0789 Other chest pain: Secondary | ICD-10-CM | POA: Insufficient documentation

## 2012-01-14 DIAGNOSIS — I2510 Atherosclerotic heart disease of native coronary artery without angina pectoris: Secondary | ICD-10-CM

## 2012-01-14 DIAGNOSIS — E669 Obesity, unspecified: Secondary | ICD-10-CM | POA: Insufficient documentation

## 2012-01-14 DIAGNOSIS — R0602 Shortness of breath: Secondary | ICD-10-CM

## 2012-01-14 DIAGNOSIS — Z8249 Family history of ischemic heart disease and other diseases of the circulatory system: Secondary | ICD-10-CM | POA: Insufficient documentation

## 2012-01-14 DIAGNOSIS — R5381 Other malaise: Secondary | ICD-10-CM | POA: Insufficient documentation

## 2012-01-14 DIAGNOSIS — R9431 Abnormal electrocardiogram [ECG] [EKG]: Secondary | ICD-10-CM

## 2012-01-14 DIAGNOSIS — R55 Syncope and collapse: Secondary | ICD-10-CM | POA: Insufficient documentation

## 2012-01-14 DIAGNOSIS — R5383 Other fatigue: Secondary | ICD-10-CM | POA: Insufficient documentation

## 2012-01-14 DIAGNOSIS — R42 Dizziness and giddiness: Secondary | ICD-10-CM | POA: Insufficient documentation

## 2012-01-14 DIAGNOSIS — Z87891 Personal history of nicotine dependence: Secondary | ICD-10-CM | POA: Insufficient documentation

## 2012-01-14 DIAGNOSIS — R0989 Other specified symptoms and signs involving the circulatory and respiratory systems: Secondary | ICD-10-CM | POA: Insufficient documentation

## 2012-01-14 DIAGNOSIS — R0609 Other forms of dyspnea: Secondary | ICD-10-CM | POA: Insufficient documentation

## 2012-01-14 LAB — BASIC METABOLIC PANEL
BUN: 13 mg/dL (ref 6–23)
CO2: 28 mEq/L (ref 19–32)
Calcium: 9.4 mg/dL (ref 8.4–10.5)
Creatinine, Ser: 1.4 mg/dL — ABNORMAL HIGH (ref 0.4–1.2)

## 2012-01-14 MED ORDER — AMINOPHYLLINE 25 MG/ML IV SOLN
75.0000 mg | Freq: Once | INTRAVENOUS | Status: AC
  Administered 2012-01-14: 75 mg via INTRAVENOUS

## 2012-01-14 MED ORDER — AMINOPHYLLINE 25 MG/ML IV SOLN
50.0000 mg | Freq: Once | INTRAVENOUS | Status: AC
  Administered 2012-01-14: 50 mg via INTRAVENOUS

## 2012-01-14 MED ORDER — TECHNETIUM TC 99M TETROFOSMIN IV KIT
11.0000 | PACK | Freq: Once | INTRAVENOUS | Status: AC | PRN
  Administered 2012-01-14: 11 via INTRAVENOUS

## 2012-01-14 MED ORDER — TECHNETIUM TC 99M TETROFOSMIN IV KIT
33.0000 | PACK | Freq: Once | INTRAVENOUS | Status: AC | PRN
  Administered 2012-01-14: 33 via INTRAVENOUS

## 2012-01-14 MED ORDER — REGADENOSON 0.4 MG/5ML IV SOLN
0.4000 mg | Freq: Once | INTRAVENOUS | Status: AC
  Administered 2012-01-14: 0.4 mg via INTRAVENOUS

## 2012-01-14 NOTE — Progress Notes (Addendum)
[facility] SITE 3 NUCLEAR MED [address] [place] [address] [phone]  Cardiology Nuclear Med Study  [name] is a 56 y.o. female     MRN : [id number]     DOB: Sep 30, 1955  Procedure Date: 01/14/2012  Nuclear Med Background Indication for Stress Test:  Evaluation for Ischemia and Stent Patency History:  COPD and '07 Stents, 12/11 MPS: (-) ischemia EF: 73%, 01/12 patent stent, 5/13 ECHO: EF: 55-60%, PTSD, H/O drug abuse Cardiac Risk Factors: Family History - CAD, History of Smoking, Hypertension, Lipids and Obesity  Symptoms:  Chest Pressure, Doe, Fatigue, Dizziness, Near Syncope   Nuclear Pre-Procedure Caffeine/Decaff Intake:  None NPO After: 7:30pm   Lungs:  clear O2 Sat: 94% on room air. IV 0.9% NS with Angio Cath:  22g  IV Site: L Forearm  IV Started by:  Bonnita Levan, RN  Chest Size (in):  38 Cup Size: B  Height: 5\' 6"  (1.676 m)  Weight:  222 lb (100.699 kg)  BMI:  Body mass index is 35.83 kg/(m^2). Tech Comments:  BS @ 9AM =114  This patient experienced chest pressure and a headache while waiting for the last portion of her test. At 14:40 she was given Aminophylline 75 mg IV with total relief of her headache. After her 2nd set of pictures she still had chest pressure. She was then given an additional 50 mg IV Aminophylline. Within 5 minutes she was completely pain free and was sent home. Advised to call with any problems.    Nuclear Med Study 1 or 2 day study: 1 day  Stress Test Type:  Eugenie Birks  Reading MD: Marca Ancona, MD  Order Authorizing Provider:  Marca Ancona, MD  Resting Radionuclide: Technetium 83m Tetrofosmin  Resting Radionuclide Dose: 11.0 mCi   Stress Radionuclide:  Technetium 68m Tetrofosmin  Stress Radionuclide Dose: 32.9 mCi           Stress Protocol Rest HR: 64 Stress HR: 90  Rest BP: 150/91 Stress BP: 145/97  Exercise Time (min): n/a METS: n/a   Predicted Max HR: 164 bpm % Max HR: 57.93 bpm Rate Pressure  Product: [id number]   Dose of Adenosine (mg):  n/a Dose of Lexiscan: 0.4 mg  Dose of Atropine (mg): n/a Dose of Dobutamine: n/a mcg/kg/min (at max HR)  Stress Test Technologist: Milana Na, EMT-P  Nuclear Technologist:  Doyne Keel, CNMT     Rest Procedure:  Myocardial perfusion imaging was performed at rest 45 minutes following the intravenous administration of Technetium 72m Tetrofosmin. Rest ECG: NSR - Normal EKG  Stress Procedure:  The patient received IV Lexiscan 0.4 mg over 15-seconds.  Technetium 68m Tetrofosmin injected at 30-seconds.  There were non specific changes, + sob, nausea, chest pressure, and dry heaving with Lexiscan.  Quantitative spect images were obtained after a 45 minute delay. Stress ECG: No significant change from baseline ECG  QPS Raw Data Images:  Normal; no motion artifact; normal heart/lung ratio. Stress Images:  Normal homogeneous uptake in all areas of the myocardium. Rest Images:  Normal homogeneous uptake in all areas of the myocardium. Subtraction (SDS):  There is no evidence of scar or ischemia. Transient Ischemic Dilatation (Normal <1.22):  1.11 Lung/Heart Ratio (Normal <0.45):  0.23  Quantitative Gated Spect Images QGS EDV:  73 ml QGS ESV:  25 ml  Impression Exercise Capacity:  Lexiscan with no exercise. BP Response:  Normal blood pressure response. Clinical Symptoms:  Short of breath, nausea.  ECG Impression:  No  significant ST segment change suggestive of ischemia. Comparison with Prior Nuclear Study: No images to compare  Overall Impression:  Normal stress nuclear study.  LV Ejection Fraction: 66%.  LV Wall Motion:  NL LV Function; NL Wall Motion  [name]  Normal study, please inform patient.   Marca Ancona 01/18/2012

## 2012-01-18 NOTE — Progress Notes (Signed)
Pt.notified

## 2012-01-22 ENCOUNTER — Ambulatory Visit: Payer: PRIVATE HEALTH INSURANCE | Admitting: Cardiology

## 2012-01-25 ENCOUNTER — Ambulatory Visit (INDEPENDENT_AMBULATORY_CARE_PROVIDER_SITE_OTHER): Payer: PRIVATE HEALTH INSURANCE | Admitting: Cardiology

## 2012-01-25 ENCOUNTER — Encounter: Payer: Self-pay | Admitting: Cardiology

## 2012-01-25 VITALS — BP 120/90 | HR 79 | Ht 66.0 in | Wt 218.0 lb

## 2012-01-25 DIAGNOSIS — R002 Palpitations: Secondary | ICD-10-CM

## 2012-01-25 DIAGNOSIS — E785 Hyperlipidemia, unspecified: Secondary | ICD-10-CM

## 2012-01-25 DIAGNOSIS — R42 Dizziness and giddiness: Secondary | ICD-10-CM

## 2012-01-25 DIAGNOSIS — I2510 Atherosclerotic heart disease of native coronary artery without angina pectoris: Secondary | ICD-10-CM

## 2012-01-25 LAB — BASIC METABOLIC PANEL
BUN: 14 mg/dL (ref 6–23)
CO2: 32 mEq/L (ref 19–32)
Calcium: 9.4 mg/dL (ref 8.4–10.5)
Creatinine, Ser: 1.5 mg/dL — ABNORMAL HIGH (ref 0.4–1.2)
Glucose, Bld: 86 mg/dL (ref 70–99)

## 2012-01-25 LAB — LIPID PANEL
Cholesterol: 359 mg/dL — ABNORMAL HIGH (ref 0–200)
Total CHOL/HDL Ratio: 10
Triglycerides: 274 mg/dL — ABNORMAL HIGH (ref 0.0–149.0)

## 2012-01-25 MED ORDER — ROSUVASTATIN CALCIUM 20 MG PO TABS: 20.0000 mg | ORAL_TABLET | Freq: Every day | ORAL | Status: DC

## 2012-01-25 NOTE — Progress Notes (Signed)
Patient ID: LARITZA VOKES, female   DOB: 08-26-1955, 56 y.o.   MRN: [id number] PCP: Dr. Ivory Broad  56 yo with history of CAD and COPD presents for followup.  Last LHC in 2012 showed patent LAD stent.  This spring, she has had a lot of trouble with orthostatic lightheadedness.  She had an episode of syncope.  She has been seen a couple of times by Tereso Newcomer in this office.  There was concern that her psychiatric meds were causing the orthostasis.  These have been changed by her psychiatrist.  She has also stopped taking lisinopril and Lasix.  Since these changes, her lightheadedness has resolved.  She has been doing well.  SBP running in the 120s-130s.  Chronically she has had rare chest pressure after walking a long distance.  There has been no change in this pattern.  No exertional dyspnea.  Echo in 5/13 showed preserved EF and Lexiscan myoview in 6/13 was normal.  She has not been taking her statin and LDL was very high when checked today.   Labs (12/11): BNP 40  Labs (1/12): HCT 38, D dimer negative, LDL 70, HDL 41, creatinine 1.29, TSH  Labs (10/12): TSH normal, LDL 87, HDL 36, K 3.4, creatinine 1.30 Labs (7/13): LDL 251  ECG: NSR, anterior T wave inversions  Allergies (verified):  1) ! * Vantin  2) Erythromycin Ethylsuccinate (Erythromycin Ethylsuccinate)  3) Cephalexin (Cephalexin)  4) Trazodone Hcl (Trazodone Hcl)   Past Medical History:  1. 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 DEPRESSION, MAJOR (ICD-296.20)  3. Hx of NARCOTIC ABUSE (ICD-305.90)  4. BIPOLAR DISORDER UNSPECIFIED (ICD-296.80)  5. MULTIPLE PERSONALITY DISORDER (ICD-300.14)  6. HYPERTENSION (ICD-401.9)  7. HYPERLIPIDEMIA (ICD-272.4)  8. G E R D (ICD-530.81)  9. C O P D (ICD-496): Quit smoking in 12/11. PFTs (4/10): FEV1 66%, ratio 64%, DLCO 67%.  10. CORONARY HEART DISEASE (ICD-414.00): Cypher DES to mid LAD in 2007. Myoview 3/09 with EF 72%, no ischemia or infarction. Myoview (12/11) EF 73%, suspected anterior soft  tissue attenuation with no ischemia or infarction.  Left heart cath (1/12): patent LAD stent, mild LIs only.  Lexiscan myoview (6/13) showed no ischemia or infarction.  11. Partial nephrectomy 1997  12. Diabetes mellitus  13. Echo (1/08): EF 65%, no significant valvular abnormalities. Echo (12/11): EF 55-60%, grade II diastolic dysfunction, normal wall motion, mild left atrial enlargement.  Echo (5/13) with EF 55-60%, mild LVH.  14. Palpitations: Holter (12/11) with occasional PVCs.  Holter (11/12): rare PACs and PVCs.  15. Esophageal stricture s/p dilatation in 7/12.  16. Obesity.  17. Orthostatic hypotension likely related to psych meds  Family History:  emphysema: mother  asthma: mother  heart disease: father with MI at 47, brother and sister with MIs in their 37s  cancer: mother (ovarian)   Social History:  pt is married with children, lives in [place].  Unemployed  Tobacco Use - Former. 07/07/10  ROS: All systems reviewed and negative except as per HPI.   Current Outpatient Prescriptions  Medication Sig Dispense Refill  . albuterol (PROVENTIL,VENTOLIN) 90 MCG/ACT inhaler Inhale 2 puffs into the lungs every 6 (six) hours as needed for wheezing.  17 g  12  . aspirin 81 MG chewable tablet Chew 162 mg by mouth daily.      . budesonide-formoterol (SYMBICORT) 160-4.5 MCG/ACT inhaler Inhale 2 puffs into the lungs 2 (two) times daily.  1 Inhaler  12  . buPROPion (WELLBUTRIN XL) 300 MG 24 hr tablet Take 300 mg  by mouth every morning.       . clonazePAM (KLONOPIN) 0.5 MG tablet Take 1 tablet (0.5 mg total) by mouth 3 (three) times daily as needed for anxiety.      . DULoxetine (CYMBALTA) 60 MG capsule Take 60 mg by mouth daily.       . metFORMIN (GLUCOPHAGE) 500 MG tablet Take 500 mg by mouth daily with breakfast.        . nitroGLYCERIN (NITROSTAT) 0.4 MG SL tablet Place 0.4 mg under the tongue every 5 (five) minutes as needed. For chest pain      . ranitidine (ZANTAC) 150 MG tablet Take  1 tablet (150 mg total) by mouth 2 (two) times daily.      Marland Kitchen DISCONTD: clonazePAM (KLONOPIN) 0.5 MG tablet Take 0.5 mg by mouth daily.       . rosuvastatin (CRESTOR) 20 MG tablet Take 1 tablet (20 mg total) by mouth daily.  30 tablet  3  . TOPROL XL 50 MG 24 hr tablet 1/2 tablet dailyTake with or immediately following a meal.      . DISCONTD: metoprolol succinate (TOPROL-XL) 25 MG 24 hr tablet 1 TABLET EVERY NIGHT        BP 120/90  Pulse 79  Ht 5\' 6"  (1.676 m)  Wt 98.884 kg (218 lb)  BMI 35.19 kg/m2 General: NAD, obese.  Neck: Thick neck, no JVD, no thyromegaly or thyroid nodule.  Lungs: Clear to auscultation bilaterally with mildly prolonged expiratory phase.  CV: Nondisplaced PMI.  Heart regular S1/S2, no S3/S4, no murmur.  No peripheral edema.  No carotid bruit.  Normal pedal pulses.  Abdomen: Soft, nontender, no hepatosplenomegaly, no distention.  Neurologic: Alert and oriented x 3.  Psych: Normal affect. Extremities: No clubbing or cyanosis.

## 2012-01-25 NOTE — Patient Instructions (Addendum)
I will call you later today and go over your medication with you. [name]  Your physician recommends that you have  a FASTING lipid profile /BMET today.  Your physician wants you to follow-up in: 4 months with Dr Shirlee Latch. (November 2013).You will receive a reminder letter in the mail two months in advance. If you don't receive a letter, please call our office to schedule the follow-up appointment.

## 2012-01-25 NOTE — Assessment & Plan Note (Addendum)
Orthostatic hypotension seems to have resolved.  She is off Lasix and lisinopril and also off the psych meds that were possible culprits (perphenazine was one).  BP has been within normal limits.

## 2012-01-25 NOTE — Assessment & Plan Note (Signed)
Very high LDL.  She stopped Crestor and is not sure why.  She needs to restart Crestor 20 mg daily with lipids/LFTs in 2 months.

## 2012-01-25 NOTE — Assessment & Plan Note (Signed)
Stable with only rare chest pain.  Myoview in 6/13 was normal.  EF preserved on echo.  Continue ASA 81 and Toprol XL.  She needs to restart statin.

## 2012-01-26 ENCOUNTER — Telehealth: Payer: Self-pay | Admitting: Cardiology

## 2012-01-26 NOTE — Telephone Encounter (Signed)
Spoke with pt about her medicaions

## 2012-01-26 NOTE — Telephone Encounter (Signed)
Pt has questions about her medications and did not want to leave question she only wants to talk to [facility]

## 2012-02-01 ENCOUNTER — Other Ambulatory Visit: Payer: PRIVATE HEALTH INSURANCE

## 2012-02-01 ENCOUNTER — Telehealth: Payer: Self-pay | Admitting: Cardiology

## 2012-02-01 MED ORDER — METOPROLOL SUCCINATE ER 25 MG PO TB24: 25.0000 mg | ORAL_TABLET | Freq: Every day | ORAL | Status: DC

## 2012-02-01 NOTE — Telephone Encounter (Signed)
Fu call Pt wanted to talk to you again

## 2012-02-01 NOTE — Telephone Encounter (Signed)
Patient called stated she went to drug store and checked her B/P 181/112.Patient advised to start toprol 25 mg daily as prescribed.Advised to monitor B/P after taking toprol and call back if needed.

## 2012-02-01 NOTE — Telephone Encounter (Signed)
New msg Pt said her BP has gone back up 170/110 today. She is off all of her meds. Please call

## 2012-02-01 NOTE — Telephone Encounter (Signed)
Patient called stated blood pressure has been going back up for the past week.States B/P 01/31/12 160/70 today 02/01/12 170/110 pulse-95.States she is not taking any B/P medication it was all stopped.Patient was told Dr.McLean out of office today.Spoke with DOD Dr.Crenshaw and he advised to start toprol 25 mg daily.Advised to monitor blood pressure and call back if needed.

## 2012-02-02 ENCOUNTER — Other Ambulatory Visit: Payer: Self-pay | Admitting: Cardiology

## 2012-02-03 ENCOUNTER — Telehealth: Payer: Self-pay | Admitting: Cardiology

## 2012-02-03 NOTE — Telephone Encounter (Signed)
Spoke with pt who reports she needs refill on Wellbutrin sent to [facility] on [address]. She states Dr. Shirlee Latch prescribed this for her.  Pt states she takes 25 mg daily. Our records indicate she is on 300 mg Wellbutrin daily.  Pt states this was recently changed due to side effects.  She is aware Toprol is 25 mg daily but she also states Wellbutrin is 25 mg daily.  I told pt I would forward to [facility] to review and clarify with Dr. Shirlee Latch when back in office tomorrow. Pt agreeable with this plan

## 2012-02-03 NOTE — Telephone Encounter (Signed)
New msg Pt wants to talk to a nurse about her meds. Please call

## 2012-02-04 NOTE — Telephone Encounter (Signed)
No answer

## 2012-02-04 NOTE — Telephone Encounter (Signed)
Spoke with pt. She is aware Dr Shirlee Latch did not prescribe Wellbutrin 300mg  daily for her.

## 2012-02-04 NOTE — Telephone Encounter (Signed)
Dr Shirlee Latch would not prescribe Welbutrin 300mg  daily. I have faxed this information to [facility]. I have attempted to contact pt to let her know this but I did not get an answer at phone number provided.

## 2012-03-21 ENCOUNTER — Emergency Department (HOSPITAL_COMMUNITY)
Admission: EM | Admit: 2012-03-21 | Discharge: 2012-03-21 | Disposition: A | Discharge status: Home / Self Care | Payer: PRIVATE HEALTH INSURANCE | Attending: Emergency Medicine | Admitting: Emergency Medicine

## 2012-03-21 ENCOUNTER — Emergency Department (HOSPITAL_COMMUNITY): Payer: PRIVATE HEALTH INSURANCE

## 2012-03-21 ENCOUNTER — Encounter (HOSPITAL_COMMUNITY): Payer: Self-pay

## 2012-03-21 DIAGNOSIS — Z79899 Other long term (current) drug therapy: Secondary | ICD-10-CM | POA: Insufficient documentation

## 2012-03-21 DIAGNOSIS — I10 Essential (primary) hypertension: Secondary | ICD-10-CM | POA: Insufficient documentation

## 2012-03-21 DIAGNOSIS — E119 Type 2 diabetes mellitus without complications: Secondary | ICD-10-CM | POA: Insufficient documentation

## 2012-03-21 DIAGNOSIS — R079 Chest pain, unspecified: Secondary | ICD-10-CM | POA: Insufficient documentation

## 2012-03-21 LAB — BASIC METABOLIC PANEL
BUN: 8 mg/dL (ref 6–23)
CO2: 26 mEq/L (ref 19–32)
Calcium: 9.4 mg/dL (ref 8.4–10.5)
Glucose, Bld: 126 mg/dL — ABNORMAL HIGH (ref 70–99)
Potassium: 3.3 mEq/L — ABNORMAL LOW (ref 3.5–5.1)
Sodium: 142 mEq/L (ref 135–145)

## 2012-03-21 LAB — CBC
HCT: 41.1 % (ref 36.0–46.0)
Hemoglobin: 13.8 g/dL (ref 12.0–15.0)
MCH: 34.3 pg — ABNORMAL HIGH (ref 26.0–34.0)
RBC: 4.02 MIL/uL (ref 3.87–5.11)

## 2012-03-21 LAB — POCT I-STAT TROPONIN I

## 2012-03-21 LAB — TROPONIN I: Troponin I: <0.3 ng/mL (ref <0.30)

## 2012-03-21 MED ORDER — ACETAMINOPHEN 325 MG PO TABS
975.0000 mg | ORAL_TABLET | Freq: Once | ORAL | Status: AC
  Administered 2012-03-21: 975 mg via ORAL
  Filled 2012-03-21: qty 3

## 2012-03-21 MED ORDER — ASPIRIN 81 MG PO CHEW
243.0000 mg | CHEWABLE_TABLET | Freq: Once | ORAL | Status: AC
  Administered 2012-03-21: 243 mg via ORAL

## 2012-03-21 MED ORDER — SODIUM CHLORIDE 0.9 % IV SOLN: INTRAVENOUS | Status: DC

## 2012-03-21 MED ORDER — ASPIRIN 81 MG PO CHEW: 324.0000 mg | CHEWABLE_TABLET | Freq: Once | ORAL | Status: DC

## 2012-03-21 MED ORDER — METOPROLOL TARTRATE 25 MG PO TABS
25.0000 mg | ORAL_TABLET | Freq: Once | ORAL | Status: AC
  Administered 2012-03-21: 25 mg via ORAL
  Filled 2012-03-21: qty 1

## 2012-03-21 MED ORDER — NITROGLYCERIN 0.4 MG SL SUBL
0.4000 mg | SUBLINGUAL_TABLET | Freq: Once | SUBLINGUAL | Status: AC
  Administered 2012-03-21: 0.4 mg via SUBLINGUAL
  Filled 2012-03-21: qty 25

## 2012-03-21 MED ORDER — CYCLOBENZAPRINE HCL 10 MG PO TABS: 10.0000 mg | ORAL_TABLET | Freq: Two times a day (BID) | ORAL | Status: AC | PRN

## 2012-03-21 MED ORDER — ASPIRIN 81 MG PO CHEW
CHEWABLE_TABLET | ORAL | Status: AC
  Filled 2012-03-21: qty 3

## 2012-03-21 MED ORDER — METOPROLOL SUCCINATE ER 25 MG PO TB24: 50.0000 mg | ORAL_TABLET | Freq: Every day | ORAL | Status: DC

## 2012-03-21 MED ORDER — NITROGLYCERIN 0.4 MG SL SUBL
0.4000 mg | SUBLINGUAL_TABLET | SUBLINGUAL | Status: AC | PRN
  Administered 2012-03-21 (×2): 0.4 mg via SUBLINGUAL

## 2012-03-21 NOTE — ED Notes (Signed)
Pt c/o cp x3days "in the middle and left of my chest" that is intermittent and "crushing." Associated sx are nausea, dizziness, and headache. Pt denies SOB, diaphoresis. Pt states "yesterday my bp was 180/120 at home." Currently 168/98. Pt took 2 extra strength tylenol and 1 81mg  asa for pain, denies any relief. Currently pain is 7/10.

## 2012-03-21 NOTE — ED Provider Notes (Signed)
History     CSN: [id number]  Arrival date & time 03/21/12  1149   First MD Initiated Contact with Patient 03/21/12 1351      Chief Complaint  Patient presents with  . Chest Pain  . Hypertension    (Consider location/radiation/quality/duration/timing/severity/associated sxs/prior treatment) HPI...... left anterior chest pain since this morning with radiation to the neck and shoulder. Blood pressure was noted be 180/120 yesterday. No frank dyspnea, diaphoresis, nausea .  Blood pressure has been high and low the past several months depending on medication changes. Presently her doctor has decreased her blood pressure meds. Severity is mild to moderate. Nothing makes symptoms better or worse  Past Medical History  Diagnosis Date  . Anemia, unspecified   . Major depressive disorder, single episode, unspecified   . Other, mixed, or unspecified nondependent drug abuse, unspecified   . Bipolar disorder, unspecified   . Dissociative identity disorder   . Unspecified essential hypertension   . Other and unspecified hyperlipidemia   . Esophageal reflux   . Coronary atherosclerosis of unspecified type of vessel, native or graft   . Diabetes mellitus   . Hiatal hernia   . Asthma   . Chronic airway obstruction, not elsewhere classified     PT. DENIES  . Myocardial infarction     Past Surgical History  Procedure Date  . Cesarean section   . Bladder surgery     Tack  . Nephrectomy     Part of left kidney  . Tonsillectomy and adenoidectomy   . Carpal tunnel release     BILATERAL HANDS  . Coronary stent placement 2007    Family History  Problem Relation Age of Onset  . Emphysema Mother   . Ovarian cancer Mother   . Heart disease Father     mi age 61  . Heart attack Sister 30  . Heart attack Brother 30  . Colon cancer Neg Hx     History  Substance Use Topics  . Smoking status: Former Smoker -- 40 years    Types: Cigarettes    Quit date: 07/07/2010  . Smokeless tobacco:  Never Used  . Alcohol Use: No    OB History    Grav Para Term Preterm Abortions TAB SAB Ect Mult Living                  Review of Systems  All other systems reviewed and are negative.    Allergies  Trazodone hcl; Cefpodoxime proxetil; Erythromycin ethylsuccinate; and Perphenazine  Home Medications   Current Outpatient Rx  Name Route Sig Dispense Refill  . ALBUTEROL SULFATE HFA 108 (90 BASE) MCG/ACT IN AERS Inhalation Inhale 2 puffs into the lungs every 4 (four) hours as needed. For wheezing    . ASPIRIN 325 MG PO TABS Oral Take 325 mg by mouth daily.    . BUDESONIDE-FORMOTEROL FUMARATE 160-4.5 MCG/ACT IN AERO Inhalation Inhale 2 puffs into the lungs 2 (two) times daily.    . DULOXETINE HCL 60 MG PO CPEP Oral Take 60 mg by mouth daily.     Marland Kitchen HYDROCHLOROTHIAZIDE 12.5 MG PO CAPS Oral Take 12.5 mg by mouth daily.    Marland Kitchen LOXAPINE SUCCINATE 5 MG PO CAPS Oral Take 5 mg by mouth daily.    Marland Kitchen METFORMIN HCL 500 MG PO TABS Oral Take 500 mg by mouth every morning.     Marland Kitchen METOPROLOL SUCCINATE ER 25 MG PO TB24 Oral Take 25 mg by mouth daily.    Marland Kitchen  POTASSIUM CHLORIDE CRYS ER 20 MEQ PO TBCR Oral Take 20 mEq by mouth daily.    Marland Kitchen RANITIDINE HCL 150 MG PO TABS Oral Take 150 mg by mouth daily.     . CYCLOBENZAPRINE HCL 10 MG PO TABS Oral Take 1 tablet (10 mg total) by mouth 2 (two) times daily as needed for muscle spasms. 20 tablet 0  . METOPROLOL SUCCINATE ER 25 MG PO TB24 Oral Take 2 tablets (50 mg total) by mouth daily. 30 tablet 1  . NITROGLYCERIN 0.4 MG SL SUBL Sublingual Place 0.4 mg under the tongue every 5 (five) minutes as needed. For chest pain      BP 144/82  Pulse 53  Temp 98.3 F (36.8 C) (Oral)  Resp 19  SpO2 94%  Physical Exam  Nursing note and vitals reviewed. Constitutional: She is oriented to person, place, and time. She appears well-developed and well-nourished.       BP 177/95  HENT:  Head: Normocephalic and atraumatic.  Eyes: Conjunctivae and EOM are normal. Pupils are  equal, round, and reactive to light.  Neck: Normal range of motion. Neck supple.  Cardiovascular: Normal rate and regular rhythm.   Pulmonary/Chest: Effort normal and breath sounds normal.  Abdominal: Soft. Bowel sounds are normal.  Musculoskeletal: Normal range of motion.  Neurological: She is alert and oriented to person, place, and time.  Skin: Skin is warm and dry.  Psychiatric: She has a normal mood and affect.    ED Course  Procedures (including critical care time)  Labs Reviewed  CBC - Abnormal; Notable for the following:    MCV 102.2 (*)     MCH 34.3 (*)     All other components within normal limits  BASIC METABOLIC PANEL - Abnormal; Notable for the following:    Potassium 3.3 (*)     Glucose, Bld 126 (*)     GFR calc non Af Amer 58 (*)     GFR calc Af Amer 68 (*)     All other components within normal limits  POCT I-STAT TROPONIN I  TROPONIN I   Results for orders placed during the hospital encounter of 03/21/12  CBC      Component Value Range   WBC 9.0  4.0 - 10.5 K/uL   RBC 4.02  3.87 - 5.11 MIL/uL   Hemoglobin 13.8  12.0 - 15.0 g/dL   HCT 16.1  09.6 - 04.5 %   MCV 102.2 (*) 78.0 - 100.0 fL   MCH 34.3 (*) 26.0 - 34.0 pg   MCHC 33.6  30.0 - 36.0 g/dL   RDW 40.9  81.1 - 91.4 %   Platelets 276  150 - 400 K/uL  BASIC METABOLIC PANEL      Component Value Range   Sodium 142  135 - 145 mEq/L   Potassium 3.3 (*) 3.5 - 5.1 mEq/L   Chloride 104  96 - 112 mEq/L   CO2 26  19 - 32 mEq/L   Glucose, Bld 126 (*) 70 - 99 mg/dL   BUN 8  6 - 23 mg/dL   Creatinine, Ser 7.82  0.50 - 1.10 mg/dL   Calcium 9.4  8.4 - 95.6 mg/dL   GFR calc non Af Amer 58 (*) >90 mL/min   GFR calc Af Amer 68 (*) >90 mL/min  POCT I-STAT TROPONIN I      Component Value Range   Troponin i, poc 0.00  0.00 - 0.08 ng/mL   Comment 3  TROPONIN I      Component Value Range   Troponin I <0.30  <0.30 ng/mL   Dg Chest 2 View  03/21/2012  *RADIOLOGY REPORT*  Clinical Data: Chest pain.  CHEST  - 2 VIEW  Comparison: CT chest and chest radiograph 09/13/2011.  Findings: Trachea is midline.  Heart size normal.  Biapical pleural thickening.  Lungs are otherwise clear.  No pleural fluid.  IMPRESSION: No acute findings.   Original Report Authenticated By: Reyes Ivan, M.D.    Date: 03/21/2012  Rate: 101  Rhythm: sinus tachycardia  QRS Axis: normal  Intervals: normal  ST/T Wave abnormalities: normal  Conduction Disutrbances:none  Narrative Interpretation:   Old EKG Reviewed: none available    1. Chest pain   2. Hypertension       MDM  Troponins negative x2. EKG shows no acute changes. Will increase Toprol XL 50 mg daily. Followup primary care doctor this week.        Donnetta Hutching, MD 03/21/12 2100

## 2012-03-21 NOTE — ED Notes (Signed)
Pt states she thinks that the pain she is experiencing is muscle strain. She has been spending more time playing with and carrying her grandchildren.

## 2012-03-21 NOTE — ED Notes (Signed)
Pt compliains of chest pain and htn, onset yesterday adn cp started 3 days ago on and off, crushing in nature and radiates to back.

## 2012-03-25 ENCOUNTER — Ambulatory Visit (INDEPENDENT_AMBULATORY_CARE_PROVIDER_SITE_OTHER): Payer: PRIVATE HEALTH INSURANCE | Admitting: Physician Assistant

## 2012-03-25 ENCOUNTER — Encounter: Payer: Self-pay | Admitting: Physician Assistant

## 2012-03-25 ENCOUNTER — Telehealth: Payer: Self-pay | Admitting: Cardiology

## 2012-03-25 VITALS — BP 144/100 | HR 97 | Ht 66.0 in | Wt 210.8 lb

## 2012-03-25 DIAGNOSIS — E785 Hyperlipidemia, unspecified: Secondary | ICD-10-CM

## 2012-03-25 DIAGNOSIS — I2510 Atherosclerotic heart disease of native coronary artery without angina pectoris: Secondary | ICD-10-CM

## 2012-03-25 DIAGNOSIS — F329 Major depressive disorder, single episode, unspecified: Secondary | ICD-10-CM

## 2012-03-25 DIAGNOSIS — R079 Chest pain, unspecified: Secondary | ICD-10-CM

## 2012-03-25 DIAGNOSIS — I10 Essential (primary) hypertension: Secondary | ICD-10-CM

## 2012-03-25 LAB — BASIC METABOLIC PANEL
Chloride: 103 mEq/L (ref 96–112)
Potassium: 3.4 mEq/L — ABNORMAL LOW (ref 3.5–5.1)

## 2012-03-25 MED ORDER — LISINOPRIL 10 MG PO TABS: 10.0000 mg | ORAL_TABLET | Freq: Every day | ORAL | Status: DC

## 2012-03-25 MED ORDER — METOPROLOL SUCCINATE ER 50 MG PO TB24: 50.0000 mg | ORAL_TABLET | Freq: Every day | ORAL | Status: DC

## 2012-03-25 NOTE — Telephone Encounter (Signed)
pt notified RX for lisinopril 10 mg was sent in today

## 2012-03-25 NOTE — Patient Instructions (Addendum)
Your physician has recommended you make the following change in your medication:START LISINOPRIL (10 MG) DAILY. IF YOU NEED A PRESCRIPTION CALL OUR OFFICE AT [phone]  Your physician recommends that you have lab work TODAY, BMET   Your physician recommends that you return for lab work ON 03/29/12 FOR A BMET AND YOUR ALREADY SCHEDULED LAB WORK  Your physician recommends that you KEEP YOUR scheduled  appointment with Dr. Shirlee Latch on 04/06/2012

## 2012-03-25 NOTE — Telephone Encounter (Signed)
Pt needs lisinopril 10mg  called into Pharm [facility] on [address]

## 2012-03-25 NOTE — Progress Notes (Signed)
[address] [place], [place]  [address] Phone: [phone] Fax:  [phone]  Date:  03/25/2012   Name:  [name]   DOB:  05-31-1956   MRN:  [id number]  PCP:  Garth Schlatter, MD  Primary Cardiologist:  Dr. Marca Ancona  Primary Electrophysiologist:  None    History of Present Illness: [name] is a 56 y.o. female who returns for followup after a visit to the emergency room..  She has a history of CAD, s/p Cypher stent to mLAD in 2007, COPD, HTN, HL, GERD, Depression.  Admitted in 08/2011 with chest pain.  CT of the chest was negative for pulmonary embolism.  She was seen several times in June and July for orthostatic hypotension thought to be related to several of her psychiatric medications. These were eventually changed with improvement in her symptoms and blood pressure. Of note, several of her blood pressure medications were also reduced. Last seen by Dr. Shirlee Latch 7/13. Statin was restarted. Blood pressure stable at that time.   Myoview in 6/13: No scar or ischemia, EF 66% LHC 1/12 with patent LAD stent and mild luminal irregs.  Echo 12/24/11: Mild LVH, EF 55-60%, grade 1 diastolic dysfunction.    She was seen in the emergency room 8/26 with chest pain. Blood pressure was 180/120. Cardiac markers were negative. Chest x-ray was nonacute. EKG was reviewed by me and demonstrated sinus rhythm with nonspecific ST-T wave changes and no changes when compared to prior tracing. Toprol was increased for blood pressure. Her chest pain is left-sided and has been fairly continuous for the last several days. It is improving. It is reproducible with palpation. She notes it with activity as well. She denies shortness of breath, syncope, orthopnea, PND.  Wt Readings from Last 3 Encounters:  03/25/12 210 lb 12.8 oz (95.618 kg)  01/25/12 218 lb (98.884 kg)  01/14/12 222 lb (100.699 kg)   Potassium  Date/Time Value Range Status  03/21/2012 12:05  PM 3.3* 3.5 - 5.1 mEq/L Final   Creatinine, Ser  Date/Time Value Range Status  03/21/2012 12:05 PM 1.04  0.50 - 1.10 mg/dL Final   Hemoglobin  Date/Time Value Range Status  03/21/2012 12:05 PM 13.8  12.0 - 15.0 g/dL Final    Past Medical History:  1. 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 DEPRESSION, MAJOR (ICD-296.20)  3. Hx of NARCOTIC ABUSE (ICD-305.90)  4. BIPOLAR DISORDER UNSPECIFIED (ICD-296.80)  5. MULTIPLE PERSONALITY DISORDER (ICD-300.14)  6. HYPERTENSION (ICD-401.9)  7. HYPERLIPIDEMIA (ICD-272.4)  8. G E R D (ICD-530.81)  9. C O P D (ICD-496): Quit smoking in 12/11. PFTs (4/10): FEV1 66%, ratio 64%, DLCO 67%.  10. CORONARY HEART DISEASE (ICD-414.00): Cypher DES to mid LAD in 2007. Myoview 3/09 with EF 72%, no ischemia or infarction. Myoview (12/11) EF 73%, suspected anterior soft tissue attenuation with no ischemia or infarction. Left heart cath (1/12): patent LAD stent, mild LIs only. Myoview in 6/13: No scar or ischemia, EF 66% 11. Partial nephrectomy 1997  12. Diabetes mellitus  13. Echo (1/08): EF 65%, no significant valvular abnormalities. Echo (12/11): EF 55-60%, grade II diastolic dysfunction, normal wall motion, mild left atrial enlargement.  Echo 12/24/11: Mild LVH, EF 55-60%, grade 1 diastolic dysfunction. 14. Palpitations: Holter (12/11) with occasional PVCs. Holter (11/12): rare PACs and PVCs.  15. Esophageal stricture s/p dilatation in 7/12.  16. Obesity.    Current Outpatient Prescriptions  Medication Sig Dispense Refill  . albuterol (PROVENTIL HFA;VENTOLIN HFA) 108 (90  BASE) MCG/ACT inhaler Inhale 2 puffs into the lungs every 4 (four) hours as needed. For wheezing      . aspirin 325 MG tablet Take 325 mg by mouth daily.      . budesonide-formoterol (SYMBICORT) 160-4.5 MCG/ACT inhaler Inhale 2 puffs into the lungs 2 (two) times daily.      . cyclobenzaprine (FLEXERIL) 10 MG tablet Take 1 tablet (10 mg total) by mouth 2 (two) times daily as needed for muscle spasms.   20 tablet  0  . DULoxetine (CYMBALTA) 60 MG capsule Take 60 mg by mouth daily.       . hydrochlorothiazide (MICROZIDE) 12.5 MG capsule Take 12.5 mg by mouth daily.      Marland Kitchen loxapine (LOXITANE) 5 MG capsule Take 5 mg by mouth daily.      . metFORMIN (GLUCOPHAGE) 500 MG tablet Take 500 mg by mouth every morning.       . metoprolol succinate (TOPROL-XL) 50 MG 24 hr tablet Take 50 mg by mouth daily. Take with or immediately following a meal.      . nitroGLYCERIN (NITROSTAT) 0.4 MG SL tablet Place 0.4 mg under the tongue every 5 (five) minutes as needed. For chest pain      . potassium chloride SA (K-DUR,KLOR-CON) 20 MEQ tablet Take 20 mEq by mouth daily.      . ranitidine (ZANTAC) 150 MG tablet Take 150 mg by mouth daily.         Allergies: Allergies  Allergen Reactions  . Trazodone Hcl Anaphylaxis  . Cefpodoxime Proxetil Other (See Comments)    unknown  . Erythromycin Ethylsuccinate Nausea And Vomiting  . Perphenazine Other (See Comments)    unknown    History  Substance Use Topics  . Smoking status: Former Smoker -- 40 years    Types: Cigarettes    Quit date: 07/07/2010  . Smokeless tobacco: Never Used  . Alcohol Use: No     PHYSICAL EXAM: VS:  BP 144/100  Pulse 97  Ht 5\' 6"  (1.676 m)  Wt 210 lb 12.8 oz (95.618 kg)  BMI 34.02 kg/m2  Well nourished, well developed, in no acute distress HEENT: normal Neck: no JVD Cardiac:  normal S1, S2; RRR; no murmur Chest: Tender to palpation over left chest Lungs:  clear to auscultation bilaterally, no wheezing, rhonchi or rales Abd: soft, nontender, no hepatomegaly Ext: no edema Skin: warm and dry Neuro:  CNs 2-12 intact, no focal abnormalities noted Psych: Normal affect  EKG:   Sinus rhythm, heart rate 97, normal axis, nonspecific ST-T wave changes, no change from prior tracing   ASSESSMENT AND PLAN:  1. Chest Pain: Symptoms are atypical. She had a normal Myoview 2 months ago. She had a patent stent in luminal irregularities on  cardiac catheterization in 1/12. ECG is unchanged. She had negative cardiac markers in the emergency room. I do not believe that she needs further cardiac workup at this time. I believe that she needs continued management of her blood pressure. She already has an appointment with Dr. Shirlee Latch in a couple of weeks. She will keep that appointment. I suspect her pain is musculoskeletal we discussed using heat and Tylenol and take tincture of time.  2. Hypertension: Still uncontrolled. Restart lisinopril 10 mg daily. Check a basic metabolic panel today and repeat a basic metabolic panel in one week.  3. Coronary Artery Disease: No further ischemic workup at this time. Continue aspirin and statin.  4. Hyperlipidemia: Continue statin. Followup lipids pending.  5. Depression: Her mood is significantly improved since I last saw her. Her medications have been adjusted and, as noted her hypotension has resolved.  SignedTereso Newcomer, PA-C  11:48 AM 03/25/2012

## 2012-03-25 NOTE — Telephone Encounter (Signed)
pt notified RX for lisinopril 10 mg was sent in today 

## 2012-03-29 ENCOUNTER — Telehealth: Payer: Self-pay | Admitting: *Deleted

## 2012-03-29 ENCOUNTER — Other Ambulatory Visit: Payer: PRIVATE HEALTH INSURANCE

## 2012-03-29 DIAGNOSIS — I2510 Atherosclerotic heart disease of native coronary artery without angina pectoris: Secondary | ICD-10-CM

## 2012-03-29 NOTE — Telephone Encounter (Signed)
pt notified of lab results, pt had to rsc lab appt and Dr. Shirlee Latch appt due to will be out of town, appts rsc today

## 2012-03-29 NOTE — Telephone Encounter (Signed)
Message copied by Tarri Fuller on Tue Mar 29, 2012  9:57 AM ------      Message from: [place], [place] T      Created: Sun Mar 27, 2012  8:24 PM       Take extra K+ 20 mEq po x 1      Make sure she gets repeat BMET in one week as planned.      Tereso Newcomer, PA-C  8:24 PM 03/27/2012

## 2012-04-06 ENCOUNTER — Ambulatory Visit: Payer: PRIVATE HEALTH INSURANCE | Admitting: Cardiology

## 2012-04-12 ENCOUNTER — Telehealth: Payer: Self-pay | Admitting: *Deleted

## 2012-04-12 ENCOUNTER — Other Ambulatory Visit (INDEPENDENT_AMBULATORY_CARE_PROVIDER_SITE_OTHER): Payer: PRIVATE HEALTH INSURANCE

## 2012-04-12 DIAGNOSIS — R079 Chest pain, unspecified: Secondary | ICD-10-CM

## 2012-04-12 DIAGNOSIS — E785 Hyperlipidemia, unspecified: Secondary | ICD-10-CM

## 2012-04-12 DIAGNOSIS — I2510 Atherosclerotic heart disease of native coronary artery without angina pectoris: Secondary | ICD-10-CM

## 2012-04-12 DIAGNOSIS — I10 Essential (primary) hypertension: Secondary | ICD-10-CM

## 2012-04-12 DIAGNOSIS — R002 Palpitations: Secondary | ICD-10-CM

## 2012-04-12 DIAGNOSIS — R42 Dizziness and giddiness: Secondary | ICD-10-CM

## 2012-04-12 LAB — HEPATIC FUNCTION PANEL
Bilirubin, Direct: 0.2 mg/dL (ref 0.0–0.3)
Total Protein: 6.3 g/dL (ref 6.0–8.3)

## 2012-04-12 LAB — LIPID PANEL
Cholesterol: 190 mg/dL (ref 0–200)
HDL: 35.7 mg/dL — ABNORMAL LOW (ref >39.00)
LDL Cholesterol: 115 mg/dL — ABNORMAL HIGH (ref 0–99)
VLDL: 39 mg/dL (ref 0.0–40.0)

## 2012-04-12 LAB — BASIC METABOLIC PANEL
BUN: 9 mg/dL (ref 6–23)
Chloride: 95 mEq/L — ABNORMAL LOW (ref 96–112)
Potassium: 3.3 mEq/L — ABNORMAL LOW (ref 3.5–5.1)
Sodium: 141 mEq/L (ref 135–145)

## 2012-04-12 NOTE — Telephone Encounter (Signed)
Message copied by Tarri Fuller on Tue Apr 12, 2012  3:20 PM ------      Message from: [place], [place] T      Created: Tue Apr 12, 2012  2:06 PM       Increase K+ to 40 mEq daily.      Check repeat BMET in one week.      Tereso Newcomer, PA-C  2:06 PM 04/12/2012

## 2012-04-12 NOTE — Telephone Encounter (Signed)
pt notified about lab results today and to increase K+ to 40 meq daily, repeat bmet 04/21/12

## 2012-04-12 NOTE — Telephone Encounter (Signed)
Patient came in for lab work today and complained of headache. She has [place] the beach all [place] and eating out with a diet high in salt. BP with a large cuff was 160/110 with a heart rate of 90. Discussed with Tereso Newcomer PA who has seen her last on 8/30. He advised to cut back on salt intake and increase Lisinopril to 20mg  every day and keep appointment with Dr.McLean on 9/19. She is requesting a script for Flexeril but Tereso Newcomer PA advised that she obtain this thru her primary care doctor.

## 2012-04-13 ENCOUNTER — Other Ambulatory Visit: Payer: PRIVATE HEALTH INSURANCE

## 2012-04-14 ENCOUNTER — Ambulatory Visit (INDEPENDENT_AMBULATORY_CARE_PROVIDER_SITE_OTHER): Payer: PRIVATE HEALTH INSURANCE | Admitting: Cardiology

## 2012-04-14 ENCOUNTER — Encounter: Payer: Self-pay | Admitting: Cardiology

## 2012-04-14 VITALS — BP 160/101 | HR 103 | Resp 18 | Ht 66.0 in | Wt 214.4 lb

## 2012-04-14 DIAGNOSIS — I2510 Atherosclerotic heart disease of native coronary artery without angina pectoris: Secondary | ICD-10-CM

## 2012-04-14 DIAGNOSIS — J449 Chronic obstructive pulmonary disease, unspecified: Secondary | ICD-10-CM

## 2012-04-14 DIAGNOSIS — F17200 Nicotine dependence, unspecified, uncomplicated: Secondary | ICD-10-CM

## 2012-04-14 DIAGNOSIS — E785 Hyperlipidemia, unspecified: Secondary | ICD-10-CM

## 2012-04-14 DIAGNOSIS — I10 Essential (primary) hypertension: Secondary | ICD-10-CM

## 2012-04-14 MED ORDER — HYDROCHLOROTHIAZIDE 25 MG PO TABS: 25.0000 mg | ORAL_TABLET | Freq: Every day | ORAL | Status: DC

## 2012-04-14 MED ORDER — ROSUVASTATIN CALCIUM 40 MG PO TABS: 40.0000 mg | ORAL_TABLET | Freq: Every day | ORAL | Status: DC

## 2012-04-14 MED ORDER — LISINOPRIL 40 MG PO TABS: 40.0000 mg | ORAL_TABLET | Freq: Every day | ORAL | Status: DC

## 2012-04-14 MED ORDER — DOXYCYCLINE HYCLATE 100 MG PO TABS: ORAL_TABLET | ORAL | Status: DC

## 2012-04-14 MED ORDER — PREDNISONE 20 MG PO TABS: ORAL_TABLET | ORAL | Status: DC

## 2012-04-14 NOTE — Patient Instructions (Signed)
Increase crestor to 40mg  daily.  Increase lisinopril to 40mg  daily.  Increase HCTZ (hydrochlorothiazide) to 25mg  daily.  Your physician recommends that you return for lab work in: 2 weeks--BMET.  Your physician recommends that you schedule a follow-up appointment in: 1 month with [facility]   Your physician recommends that you return for a FASTING lipid profile /liver profile in 2 months.  For your breathing/lung disease:  Take prednisone 40mg  daily for 3 days then 20mg  daily for 3 days. This will be two 20mg  tablets for 3 days, then one 20mg  tablet for 3 days.  Take doxycycline 100mg  two times a day for 7 days.  If your breathing is not better after taking these medications you should call Dr Ivory Broad.

## 2012-04-14 NOTE — Progress Notes (Signed)
Patient ID: [name], female   DOB: 1955-10-08, 56 y.o.   MRN: [id number] PCP: Dr. Ivory Broad  56 yo with history of CAD and COPD presents for followup.  Last LHC in 2012 showed patent LAD stent.  This last spring, she had a lot of trouble with orthostatic lightheadedness.  She had an episode of syncope.  She was been seen a couple of times by Tereso Newcomer in this office.  There was concern that her psychiatric meds were causing the orthostasis.  These have been changed by her psychiatrist.  Since these changes, her lightheadedness has resolved. Chronically she has had rare chest pressure after walking a long distance.  There has been no change in this pattern.  No exertional dyspnea.  Echo in 5/13 showed preserved EF and Lexiscan myoview in 6/13 was normal.    Recently, her BP has been running high.  She was in the ER in 8/13 with chest pain and BP 180/120.  Recently, she ran out of HCTZ and BP today is 160/101.  BP has been high every day recently when she checks.  She was at [facility] last week and ate out with a high sodium diet every night.  Over the last few days, she has developed wheezing and some dyspnea/congestion.  She has been using her albuterol inhaler every 4 hours.   Labs (12/11): BNP 40  Labs (1/12): HCT 38, D dimer negative, LDL 70, HDL 41, creatinine 1.29, TSH  Labs (10/12): TSH normal, LDL 87, HDL 36, K 3.4, creatinine 4.09 Labs (7/13): LDL 251 Labs (9/13): LDL 115, TGs 195, HDL 36, LFTs normal, K 3.3, creatinine 1.2  ECG: NSR, nonspecific T wave changes  Allergies (verified):  1) ! * Vantin  2) Erythromycin Ethylsuccinate (Erythromycin Ethylsuccinate)  3) Cephalexin (Cephalexin)  4) Trazodone Hcl (Trazodone Hcl)   Past Medical History:  1. 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 DEPRESSION, MAJOR (ICD-296.20)  3. Hx of NARCOTIC ABUSE (ICD-305.90)  4. BIPOLAR DISORDER UNSPECIFIED (ICD-296.80)  5. MULTIPLE PERSONALITY DISORDER (ICD-300.14)  6. HYPERTENSION  (ICD-401.9)  7. HYPERLIPIDEMIA (ICD-272.4)  8. G E R D (ICD-530.81)  9. C O P D (ICD-496): Quit smoking in 12/11. PFTs (4/10): FEV1 66%, ratio 64%, DLCO 67%.  10. CORONARY HEART DISEASE (ICD-414.00): Cypher DES to mid LAD in 2007. Myoview 3/09 with EF 72%, no ischemia or infarction. Myoview (12/11) EF 73%, suspected anterior soft tissue attenuation with no ischemia or infarction.  Left heart cath (1/12): patent LAD stent, mild LIs only.  Lexiscan myoview (6/13) showed no ischemia or infarction.  11. Partial nephrectomy 1997  12. Diabetes mellitus  13. Echo (1/08): EF 65%, no significant valvular abnormalities. Echo (12/11): EF 55-60%, grade II diastolic dysfunction, normal wall motion, mild left atrial enlargement.  Echo (5/13) with EF 55-60%, mild LVH.  14. Palpitations: Holter (12/11) with occasional PVCs.  Holter (11/12): rare PACs and PVCs.  15. Esophageal stricture s/p dilatation in 7/12.  16. Obesity.  17. Orthostatic hypotension likely related to psych meds  Family History:  emphysema: mother  asthma: mother  heart disease: father with MI at 63, brother and sister with MIs in their 73s  cancer: mother (ovarian)   Social History:  pt is married with children, lives in [place].  Unemployed  Tobacco Use - Former. 07/07/10  ROS: All systems reviewed and negative except as per HPI.   Current Outpatient Prescriptions  Medication Sig Dispense Refill  . albuterol (PROVENTIL HFA;VENTOLIN HFA) 108 (90 BASE) MCG/ACT inhaler Inhale 2  puffs into the lungs every 4 (four) hours as needed. For wheezing      . aspirin 325 MG tablet Take 325 mg by mouth daily.      . budesonide-formoterol (SYMBICORT) 160-4.5 MCG/ACT inhaler Inhale 2 puffs into the lungs 2 (two) times daily.      . DULoxetine (CYMBALTA) 60 MG capsule Take 60 mg by mouth daily.       Marland Kitchen loxapine (LOXITANE) 5 MG capsule Take 5 mg by mouth daily.      . metFORMIN (GLUCOPHAGE) 500 MG tablet Take 500 mg by mouth every morning.         . metoprolol succinate (TOPROL-XL) 50 MG 24 hr tablet Take 50 mg by mouth daily. Take with or immediately following a meal.      . nitroGLYCERIN (NITROSTAT) 0.4 MG SL tablet Place 0.4 mg under the tongue every 5 (five) minutes as needed. For chest pain      . potassium chloride SA (K-DUR,KLOR-CON) 20 MEQ tablet Take 20 mEq by mouth daily.      . ranitidine (ZANTAC) 150 MG tablet Take 150 mg by mouth daily.       Marland Kitchen DISCONTD: hydrochlorothiazide (MICROZIDE) 12.5 MG capsule Take 12.5 mg by mouth daily.      Marland Kitchen DISCONTD: lisinopril (PRINIVIL,ZESTRIL) 10 MG tablet Take 2 tab every day.  30 tablet  11  . doxycycline (VIBRA-TABS) 100 MG tablet One two times a day for 7 days  14 tablet  0  . hydrochlorothiazide (HYDRODIURIL) 25 MG tablet Take 1 tablet (25 mg total) by mouth daily.  30 tablet  3  . lisinopril (PRINIVIL,ZESTRIL) 40 MG tablet Take 1 tablet (40 mg total) by mouth daily.  30 tablet  3  . predniSONE (DELTASONE) 20 MG tablet 2 daily (total 40mg ) for 3 days, then 1 daily for 3 days.  9 tablet  0  . rosuvastatin (CRESTOR) 40 MG tablet Take 1 tablet (40 mg total) by mouth daily.  30 tablet  3    BP 160/101  Pulse 103  Resp 18  Ht 5\' 6"  (1.676 m)  Wt 214 lb 6.4 oz (97.251 kg)  BMI 34.60 kg/m2  SpO2 93% General: NAD, obese.  Neck: Thick neck, no JVD, no thyromegaly or thyroid nodule.  Lungs: Bilateral wheezing CV: Nondisplaced PMI.  Heart regular S1/S2, no S3/S4, no murmur.  Trace ankle edema.  No carotid bruit.  Normal pedal pulses.  Abdomen: Soft, nontender, no hepatosplenomegaly, no distention.  Neurologic: Alert and oriented x 3.  Psych: Normal affect. Extremities: No clubbing or cyanosis.   Assessment/Plan: 1. CAD: Occasional atypical chest pain.  She had a negative myoview in 6/13.  Continue ASA, statin, ACEI, beta blocker.   2. Hyperlipidemia: LDL improved since she restarted Crestor but still above goal (< 70).  I will have her increase Crestor to 40 mg daily with lipids/LFTs in  2 months.   3. HTN: BP has been running much higher since her psychiatric medications were adjusted.  I will have her restart HCTZ 25 mg daily and increase lisinopril to 40 mg daily. She will need a BMET in 2 wks.  She will followup with Tereso Newcomer in a month.  4. COPD: Patient has significant wheezing.   I suspect that she has a COPD exacerbation.  I will give her a brief course of predisone along with doxycycline x 1 week.  She can continue to use her rescue albuterol inhaler.   Marca Ancona 04/14/2012

## 2012-04-18 ENCOUNTER — Other Ambulatory Visit: Payer: PRIVATE HEALTH INSURANCE

## 2012-04-21 ENCOUNTER — Other Ambulatory Visit: Payer: PRIVATE HEALTH INSURANCE

## 2012-04-25 ENCOUNTER — Telehealth: Payer: Self-pay | Admitting: Cardiology

## 2012-04-25 NOTE — Telephone Encounter (Signed)
F/U  Okey Regal, patient called stating she will be home all day today.

## 2012-04-25 NOTE — Telephone Encounter (Signed)
pt states sine her lisinopril was increased 9/19 and re-started back on hctz 25 daily that her BP has not been below 150/100, 9/29 was 154/100, 9/28 150/104, pt coming in 10/1 for bmet. I told pt I will d/w PA or Dr. Shirlee Latch about her BP

## 2012-04-25 NOTE — Telephone Encounter (Signed)
New problem:  Medication was changed about 2 week. Still having blood pressure issues

## 2012-04-26 ENCOUNTER — Other Ambulatory Visit: Payer: PRIVATE HEALTH INSURANCE

## 2012-04-26 ENCOUNTER — Telehealth: Payer: Self-pay | Admitting: *Deleted

## 2012-04-26 MED ORDER — AMLODIPINE BESYLATE 5 MG PO TABS: 5.0000 mg | ORAL_TABLET | Freq: Every day | ORAL | Status: DC

## 2012-04-26 NOTE — Telephone Encounter (Signed)
per Tereso Newcomer, PAC start pt on norvasc 5 mg daily, rx sent in today, lmom ptcb if any questions

## 2012-04-27 ENCOUNTER — Telehealth: Payer: Self-pay | Admitting: *Deleted

## 2012-04-27 ENCOUNTER — Other Ambulatory Visit (INDEPENDENT_AMBULATORY_CARE_PROVIDER_SITE_OTHER): Payer: PRIVATE HEALTH INSURANCE

## 2012-04-27 DIAGNOSIS — I2510 Atherosclerotic heart disease of native coronary artery without angina pectoris: Secondary | ICD-10-CM

## 2012-04-27 LAB — BASIC METABOLIC PANEL
BUN: 19 mg/dL (ref 6–23)
Chloride: 96 mEq/L (ref 96–112)
Creatinine, Ser: 1.5 mg/dL — ABNORMAL HIGH (ref 0.4–1.2)
Glucose, Bld: 93 mg/dL (ref 70–99)
Potassium: 3.5 mEq/L (ref 3.5–5.1)

## 2012-04-27 NOTE — Telephone Encounter (Signed)
Message copied by Tarri Fuller on Wed Apr 27, 2012  5:27 PM ------      Message from: [place], [place] T      Created: Wed Apr 27, 2012  4:54 PM       Creatinine increased some      Check bmet in 10 days      Tereso Newcomer, [place]  4:54 PM 04/27/2012

## 2012-04-27 NOTE — Telephone Encounter (Signed)
pt notified about lab results w/verbal understanding and repeat bmet 10/11

## 2012-04-28 ENCOUNTER — Other Ambulatory Visit: Payer: PRIVATE HEALTH INSURANCE

## 2012-05-02 ENCOUNTER — Telehealth: Payer: Self-pay | Admitting: Cardiology

## 2012-05-02 NOTE — Telephone Encounter (Signed)
New problem:  C/O having trouble with kidney. No chest pain, no sob. medication was changes

## 2012-05-02 NOTE — Telephone Encounter (Signed)
Reviewed with Tereso Newcomer and he agreed with this plan.

## 2012-05-02 NOTE — Telephone Encounter (Signed)
Pt states she has not been urinating as frequently as normal. She has been urinating about 4 times a day and usually she would urinate 12 times a day. Pt states she has left sided pain starting on Friday. Pt does have a history of kidney stones. Pt does note some discomfort when she urinates. Pt will follow up with her PCP today.

## 2012-05-06 ENCOUNTER — Emergency Department (HOSPITAL_COMMUNITY): Payer: PRIVATE HEALTH INSURANCE

## 2012-05-06 ENCOUNTER — Telehealth: Payer: Self-pay | Admitting: Cardiology

## 2012-05-06 ENCOUNTER — Other Ambulatory Visit: Payer: PRIVATE HEALTH INSURANCE

## 2012-05-06 ENCOUNTER — Emergency Department (HOSPITAL_COMMUNITY)
Admission: EM | Admit: 2012-05-06 | Discharge: 2012-05-06 | Disposition: A | Discharge status: Home / Self Care | Payer: PRIVATE HEALTH INSURANCE | Attending: Emergency Medicine | Admitting: Emergency Medicine

## 2012-05-06 ENCOUNTER — Encounter (HOSPITAL_COMMUNITY): Payer: Self-pay | Admitting: *Deleted

## 2012-05-06 DIAGNOSIS — I252 Old myocardial infarction: Secondary | ICD-10-CM | POA: Insufficient documentation

## 2012-05-06 DIAGNOSIS — I10 Essential (primary) hypertension: Secondary | ICD-10-CM | POA: Insufficient documentation

## 2012-05-06 DIAGNOSIS — E860 Dehydration: Secondary | ICD-10-CM | POA: Insufficient documentation

## 2012-05-06 DIAGNOSIS — E119 Type 2 diabetes mellitus without complications: Secondary | ICD-10-CM | POA: Insufficient documentation

## 2012-05-06 DIAGNOSIS — R079 Chest pain, unspecified: Secondary | ICD-10-CM | POA: Insufficient documentation

## 2012-05-06 DIAGNOSIS — I951 Orthostatic hypotension: Secondary | ICD-10-CM | POA: Insufficient documentation

## 2012-05-06 DIAGNOSIS — R000 Tachycardia, unspecified: Secondary | ICD-10-CM

## 2012-05-06 DIAGNOSIS — R0602 Shortness of breath: Secondary | ICD-10-CM | POA: Insufficient documentation

## 2012-05-06 DIAGNOSIS — F319 Bipolar disorder, unspecified: Secondary | ICD-10-CM | POA: Insufficient documentation

## 2012-05-06 DIAGNOSIS — I2510 Atherosclerotic heart disease of native coronary artery without angina pectoris: Secondary | ICD-10-CM | POA: Insufficient documentation

## 2012-05-06 HISTORY — DX: Atherosclerotic heart disease of native coronary artery without angina pectoris: I25.10

## 2012-05-06 HISTORY — DX: Palpitations: R00.2

## 2012-05-06 LAB — BASIC METABOLIC PANEL
CO2: 29 mEq/L (ref 19–32)
Calcium: 10.5 mg/dL (ref 8.4–10.5)
GFR calc non Af Amer: 31 mL/min — ABNORMAL LOW (ref >90)
Sodium: 140 mEq/L (ref 135–145)

## 2012-05-06 LAB — POCT I-STAT TROPONIN I: Troponin i, poc: 0 ng/mL (ref 0.00–0.08)

## 2012-05-06 LAB — CBC
MCH: 34.1 pg — ABNORMAL HIGH (ref 26.0–34.0)
Platelets: 268 10*3/uL (ref 150–400)
RBC: 4.75 MIL/uL (ref 3.87–5.11)

## 2012-05-06 LAB — PRO B NATRIURETIC PEPTIDE: Pro B Natriuretic peptide (BNP): 108.6 pg/mL (ref 0–125)

## 2012-05-06 MED ORDER — SODIUM CHLORIDE 0.9 % IV BOLUS (SEPSIS)
1000.0000 mL | Freq: Once | INTRAVENOUS | Status: AC
  Administered 2012-05-06: 1000 mL via INTRAVENOUS

## 2012-05-06 MED ORDER — POTASSIUM CHLORIDE CRYS ER 20 MEQ PO TBCR
40.0000 meq | EXTENDED_RELEASE_TABLET | Freq: Once | ORAL | Status: AC
  Administered 2012-05-06: 40 meq via ORAL
  Filled 2012-05-06 (×2): qty 1

## 2012-05-06 MED ORDER — ASPIRIN 81 MG PO CHEW
324.0000 mg | CHEWABLE_TABLET | Freq: Once | ORAL | Status: AC
  Administered 2012-05-06: 324 mg via ORAL
  Filled 2012-05-06: qty 1
  Filled 2012-05-06: qty 3

## 2012-05-06 MED ORDER — NITROGLYCERIN 0.4 MG SL SUBL
0.4000 mg | SUBLINGUAL_TABLET | SUBLINGUAL | Status: DC | PRN
  Administered 2012-05-06: 0.4 mg via SUBLINGUAL
  Filled 2012-05-06: qty 25

## 2012-05-06 MED ORDER — SODIUM CHLORIDE 0.9 % IV BOLUS (SEPSIS): 1000.0000 mL | Freq: Once | INTRAVENOUS | Status: DC

## 2012-05-06 MED ORDER — ONDANSETRON HCL 4 MG/2ML IJ SOLN
4.0000 mg | Freq: Once | INTRAMUSCULAR | Status: AC
  Administered 2012-05-06: 4 mg via INTRAVENOUS
  Filled 2012-05-06: qty 2

## 2012-05-06 NOTE — Telephone Encounter (Signed)
Pt to go to the ER immediately per Dr Clifton James.  Pt agrees.

## 2012-05-06 NOTE — Telephone Encounter (Signed)
[name] states she started a new medication for her bp 2 weeks ago.  Her bp was doing better but today it is 100/76, but her heart rate is 140.  She states she is also having palpitations, sob, nausea and dizziness.  No history of afib per pt.

## 2012-05-06 NOTE — ED Provider Notes (Signed)
Sign out received from Dr Aubery Lapping.  Patient to move to CDU holding for IVF hydration (4-5 liters total), and rechecking of orthostatic VS.  If pt is no longer orthostatic, may be d/c home with PCP or cardiology follow up.    9:18 PM Per nurse, patient has now received 3L IVF. Lying pressure 132/81 HR 89, standing BP 124/81, HR 93.    9:33 PM Pt refuses further IVF, states she is feeling much better and is ready to go home.  Pt denies any lightheadedness or dizziness with standing.  I have strongly advised patient to continue PO hydration and home and decrease caffeine intake and have reviewed all recommendations by Dr Daleen Squibb (please see his note for details).  Dr Vern Claude note suggests 2L IVF, so given patient's improvement of blood pressure and as she is no longer orthostatic, she may be discharged home.  Reviewed labs with family member per his request.  Discussed follow up and return precautions with patient.  Pt verbalizes understanding and agrees with plan.    Filed Vitals:   05/06/12 2111  BP: 132/81  Pulse: 89  Temp: 97.8 F (36.6 C)  Resp: 18    Results for orders placed during the hospital encounter of 05/06/12  CBC      Component Value Range   WBC 10.7 (*) 4.0 - 10.5 K/uL   RBC 4.75  3.87 - 5.11 MIL/uL   Hemoglobin 16.2 (*) 12.0 - 15.0 g/dL   HCT 91.4 (*) 78.2 - 95.6 %   MCV 99.4  78.0 - 100.0 fL   MCH 34.1 (*) 26.0 - 34.0 pg   MCHC 34.3  30.0 - 36.0 g/dL   RDW 21.3  08.6 - 57.8 %   Platelets 268  150 - 400 K/uL  BASIC METABOLIC PANEL      Component Value Range   Sodium 140  135 - 145 mEq/L   Potassium 3.0 (*) 3.5 - 5.1 mEq/L   Chloride 95 (*) 96 - 112 mEq/L   CO2 29  19 - 32 mEq/L   Glucose, Bld 93  70 - 99 mg/dL   BUN 11  6 - 23 mg/dL   Creatinine, Ser 4.69 (*) 0.50 - 1.10 mg/dL   Calcium 62.9  8.4 - 52.8 mg/dL   GFR calc non Af Amer 31 (*) >90 mL/min   GFR calc Af Amer 36 (*) >90 mL/min  PRO B NATRIURETIC PEPTIDE      Component Value Range   Pro B Natriuretic peptide  (BNP) 108.6  0 - 125 pg/mL  POCT I-STAT TROPONIN I      Component Value Range   Troponin i, poc 0.00  0.00 - 0.08 ng/mL   Comment 3           GLUCOSE, CAPILLARY      Component Value Range   Glucose-Capillary 94  70 - 99 mg/dL   Dg Chest Port 1 View  05/06/2012  *RADIOLOGY REPORT*  Clinical Data: Shortness of breath.  PORTABLE CHEST - 1 VIEW  Comparison: 03/21/2012  Findings: Heart and mediastinal contours are within normal limits. No focal opacities or effusions.  No acute bony abnormality.  IMPRESSION: No active cardiopulmonary disease.   Original Report Authenticated By: Cyndie Chime, M.D.       [place], [place] 05/06/12 2135

## 2012-05-06 NOTE — ED Provider Notes (Signed)
History     CSN: [id number]  Arrival date & time 05/06/12  1442   First MD Initiated Contact with Patient 05/06/12 1505      Chief Complaint  Patient presents with  . Tachycardia    (Consider location/radiation/quality/duration/timing/severity/associated sxs/prior treatment) Patient is a 56 y.o. female presenting with chest pain. The history is provided by the patient and the spouse.  Chest Pain The chest pain began 6 - 12 hours ago. Chest pain occurs constantly. The chest pain is unchanged. The pain is associated with exertion. The severity of the pain is moderate. The quality of the pain is described as heavy. The pain does not radiate. Chest pain is worsened by exertion. Primary symptoms include shortness of breath, nausea and dizziness. Pertinent negatives for primary symptoms include no fever, no fatigue, no syncope, no cough, no wheezing, no palpitations, no abdominal pain and no vomiting.  The shortness of breath began today. The shortness of breath developed suddenly. The shortness of breath is mild. The patient's medical history is significant for COPD. The patient's medical history does not include CHF.  Nausea began today.  She describes the dizziness as lightheadedness and faintness. The dizziness began today. The dizziness has been unchanged since its onset. Dizziness also occurs with nausea. Dizziness does not occur with vomiting.  She tried nothing for the symptoms. Risk factors include lack of exercise and obesity.  Her past medical history is significant for MI.     Past Medical History  Diagnosis Date  . Anemia, unspecified   . Major depressive disorder, single episode, unspecified   . Other, mixed, or unspecified nondependent drug abuse, unspecified   . Bipolar disorder, unspecified   . Dissociative identity disorder   . Unspecified essential hypertension   . Other and unspecified hyperlipidemia   . Esophageal reflux   . CAD (coronary artery disease)   .  Diabetes mellitus     a. 11/2004 - Cath showed non-obstuctive disease b. 09/2005 - PCI to mid LAD c. 07/2010 Cath revealed non obstructive disease with a patent LAD stent.   . Hiatal hernia   . Asthma   . Chronic airway obstruction, not elsewhere classified     PT. DENIES  . Myocardial infarction   . Palpitations     a. Holter (12/11) with occasional PVCs. Holter (11/12): rare PACs and PVCs.  . Diastolic dysfunction     a. Echo 12/24/11: Mild LVH, EF 55-60%, grade 1 diastolic dysfunction    Past Surgical History  Procedure Date  . Cesarean section   . Bladder surgery     Tack  . Nephrectomy     Part of left kidney  . Tonsillectomy and adenoidectomy   . Carpal tunnel release     BILATERAL HANDS  . Coronary stent placement 2007    Family History  Problem Relation Age of Onset  . Emphysema Mother   . Ovarian cancer Mother   . Heart disease Father     mi age 59  . Heart attack Sister 30  . Heart attack Brother 30  . Colon cancer Neg Hx     History  Substance Use Topics  . Smoking status: Former Smoker -- 40 years    Types: Cigarettes    Quit date: 07/07/2010  . Smokeless tobacco: Never Used  . Alcohol Use: No    OB History    Grav Para Term Preterm Abortions TAB SAB Ect Mult Living  Review of Systems  Constitutional: Negative for fever and fatigue.  Respiratory: Positive for shortness of breath. Negative for cough and wheezing.   Cardiovascular: Positive for chest pain. Negative for palpitations and syncope.  Gastrointestinal: Positive for nausea. Negative for vomiting, abdominal pain and diarrhea.  Neurological: Positive for dizziness. Negative for headaches.  All other systems reviewed and are negative.    Allergies  Trazodone hcl; Cefpodoxime proxetil; Erythromycin ethylsuccinate; and Perphenazine  Home Medications   Current Outpatient Rx  Name Route Sig Dispense Refill  . ALBUTEROL SULFATE HFA 108 (90 BASE) MCG/ACT IN AERS Inhalation  Inhale 2 puffs into the lungs every 4 (four) hours as needed. For wheezing    . AMLODIPINE BESYLATE 5 MG PO TABS Oral Take 1 tablet (5 mg total) by mouth daily. 30 tablet 11  . ASPIRIN 325 MG PO TABS Oral Take 325 mg by mouth daily.    . BUDESONIDE-FORMOTEROL FUMARATE 160-4.5 MCG/ACT IN AERO Inhalation Inhale 2 puffs into the lungs 2 (two) times daily.    Marland Kitchen DOXYCYCLINE HYCLATE 100 MG PO TABS  One two times a day for 7 days 14 tablet 0  . DULOXETINE HCL 60 MG PO CPEP Oral Take 60 mg by mouth daily.     Marland Kitchen HYDROCHLOROTHIAZIDE 25 MG PO TABS Oral Take 1 tablet (25 mg total) by mouth daily. 30 tablet 3  . LISINOPRIL 40 MG PO TABS Oral Take 1 tablet (40 mg total) by mouth daily. 30 tablet 3  . LOXAPINE SUCCINATE 5 MG PO CAPS Oral Take 5 mg by mouth daily.    Marland Kitchen METFORMIN HCL 500 MG PO TABS Oral Take 500 mg by mouth every morning.     Marland Kitchen METOPROLOL SUCCINATE ER 50 MG PO TB24 Oral Take 50 mg by mouth daily. Take with or immediately following a meal.    . NITROGLYCERIN 0.4 MG SL SUBL Sublingual Place 0.4 mg under the tongue every 5 (five) minutes as needed. For chest pain    . POTASSIUM CHLORIDE CRYS ER 20 MEQ PO TBCR Oral Take 20 mEq by mouth daily.    Marland Kitchen PREDNISONE 20 MG PO TABS  2 daily (total 40mg ) for 3 days, then 1 daily for 3 days. 9 tablet 0  . RANITIDINE HCL 150 MG PO TABS Oral Take 150 mg by mouth daily.     Marland Kitchen ROSUVASTATIN CALCIUM 40 MG PO TABS Oral Take 1 tablet (40 mg total) by mouth daily. 30 tablet 3    BP 134/78  Pulse 125  Temp 98.2 F (36.8 C) (Oral)  Resp 21  Ht 5\' 6"  (1.676 m)  Wt 204 lb (92.534 kg)  BMI 32.93 kg/m2  SpO2 97%  Physical Exam  Nursing note and vitals reviewed. Constitutional: She is oriented to person, place, and time. She appears well-developed and well-nourished. No distress.  HENT:  Head: Normocephalic and atraumatic.  Eyes: EOM are normal. Pupils are equal, round, and reactive to light.  Neck: Normal range of motion.  Cardiovascular: Normal heart sounds.   Tachycardia present.   Pulmonary/Chest: Effort normal and breath sounds normal. No respiratory distress.  Abdominal: Soft. She exhibits no distension. There is no tenderness.  Musculoskeletal: Normal range of motion. She exhibits tenderness (mild edema of legs and lower arms/hands).  Neurological: She is alert and oriented to person, place, and time.  Skin: Skin is warm and dry.    ED Course  Procedures (including critical care time)  Labs Reviewed  CBC - Abnormal; Notable for the following:  WBC 10.7 (*)     Hemoglobin 16.2 (*)     HCT 47.2 (*)     MCH 34.1 (*)     All other components within normal limits  BASIC METABOLIC PANEL - Abnormal; Notable for the following:    Potassium 3.0 (*)     Chloride 95 (*)     Creatinine, Ser 1.75 (*)     GFR calc non Af Amer 31 (*)     GFR calc Af Amer 36 (*)     All other components within normal limits  PRO B NATRIURETIC PEPTIDE  POCT I-STAT TROPONIN I  GLUCOSE, CAPILLARY  TROPONIN I   No results found.  Date: 05/06/2012  Rate: 134  Rhythm: sinus tachycardia  QRS Axis: normal  Intervals: normal  ST/T Wave abnormalities: nonspecific ST changes  Conduction Disutrbances:none  Narrative Interpretation: sinus tachycardia  Old EKG Reviewed: changes noted: increased rate    1. Dehydration   2. Orthostatic hypotension       MDM  3:15 PM Pt seen and examined. Upon awakening this AM, patient was dizzy. Throughout the day, she developed chest pressure worse with exertion, SOB, and nausea. Concern for ACS v PE. Will work up with d-dimer and troponin as well as basic labs. EKG with sinus tachycardia. Will give ASA and NTG to see if this resolves chest heaviness.   6:36 PM Pt has been seen by Dr. Daleen Squibb of [facility] cardiology. He feels that this is all related to orthostatic hypotension. He feels this is not a PE and therefore the D-dimer was canceled. Pt with some improvement with fluids but will continue to hydrate.   6:45 PM Pt will be  moved to CDU for continued IV fluids. If orthostatic VS improve, patient may be able to go home. Otherwise she will need to be admitted. Cardiology suggests patient stop HCTZ completely.  Daleen Bo, MD 05/06/12 2156

## 2012-05-06 NOTE — Progress Notes (Signed)
Patient ID: [name], female   DOB: March 07, 1956, 56 y.o.   MRN: [id number]   CARDIOLOGY CONSULT NOTE  Patient ID: [name] MRN: [id number], DOB/AGE: 11-14-1955   Admit date: 05/06/2012 Date of Consult: 05/06/2012   Primary Physician: Garth Schlatter, MD Primary Cardiologist: Elly Modena MD  Pt. Profile 56 year old married white female presents with dizziness and lightheadedness as well as tachycardia.   Problem List  Past Medical History  Diagnosis Date  . Anemia, unspecified   . Major depressive disorder, single episode, unspecified   . Other, mixed, or unspecified nondependent drug abuse, unspecified   . Bipolar disorder, unspecified   . Dissociative identity disorder   . Unspecified essential hypertension   . Other and unspecified hyperlipidemia   . Esophageal reflux   . CAD (coronary artery disease)   . Diabetes mellitus     a. 11/2004 - Cath showed non-obstuctive disease b. 09/2005 - PCI to mid LAD c. 07/2010 Cath revealed non obstructive disease with a patent LAD stent.   . Hiatal hernia   . Asthma   . Chronic airway obstruction, not elsewhere classified     PT. DENIES  . Myocardial infarction   . Palpitations     a. Holter (12/11) with occasional PVCs. Holter (11/12): rare PACs and PVCs.  . Diastolic dysfunction     a. Echo 12/24/11: Mild LVH, EF 55-60%, grade 1 diastolic dysfunction    Past Surgical History  Procedure Date  . Cesarean section   . Bladder surgery     Tack  . Nephrectomy     Part of left kidney  . Tonsillectomy and adenoidectomy   . Carpal tunnel release     BILATERAL HANDS  . Coronary stent placement 2007     Allergies  Allergies  Allergen Reactions  . Trazodone Hcl Anaphylaxis  . Cefpodoxime Proxetil Other (See Comments)    unknown  . Erythromycin Ethylsuccinate Nausea And Vomiting  . Perphenazine Other (See Comments)    unknown    HPI   [name] is a 56 year old white female who 2 was last  evaluated in the office by Dr. Jearld Pies on September 19. At that time the patient was hypertensive but had run out of her HCTZ. She was on 12 mg a day. She was restarted on HCTZ 25 mg a day and her lisinopril was increased to 40 mg a day. Recently she called the office and was complaining of chest pain. She was placed on Norvasc.  She is very noncompliant with her diet. Some day she drinks enough water and other day she does not. She drinks a lot of caffeinated beverage including regular [facility]. She is also diabetic.  She's been urinating around 10-12 times a day since her HCTZ was started 25 mg a day. Prior to that she was urinating 4-6 times a day. She states that her blood sugars run around 120 . She states she checks it daily. She also denies any dysuria, hematuria or pyuria.  She's be severely orthostatic and was sinus tachycardia in the emergency room. Note she's had chest tightness all day 10 out of 10 her EKG is unchanged from previous studies. Troponin on point care is 0.00. EKG only showed sinus tachycardia. Chest x-ray shows no acute cardiopulmonary disease.    Inpatient Medications     . aspirin  324 mg Oral Once  . ondansetron (ZOFRAN) IV  4 mg Intravenous Once  . potassium chloride  40 mEq Oral Once  .  sodium chloride  1,000 mL Intravenous Once  . sodium chloride  1,000 mL Intravenous Once    Family History Family History  Problem Relation Age of Onset  . Emphysema Mother   . Ovarian cancer Mother   . Heart disease Father     mi age 56  . Heart attack Sister 30  . Heart attack Brother 30  . Colon cancer Neg Hx      Social History History   Social History  . Marital Status: Married    Spouse Name: N/A    Number of Children: 4  . Years of Education: N/A   Occupational History  . Homemaker    Social History Main Topics  . Smoking status: Former Smoker -- 40 years    Types: Cigarettes    Quit date: 07/07/2010  . Smokeless tobacco: Never Used  . Alcohol  Use: No  . Drug Use: No  . Sexually Active: Not on file   Other Topics Concern  . Not on file   Social History Narrative   3-4 caffeine drinks daily     Review of Systems  General:  No chills, fever, night sweats or weight changes.  Cardiovascular:  No chest pain, dyspnea on exertion, edema, orthopnea, palpitations, paroxysmal nocturnal dyspnea. Dermatological: No rash, lesions/masses Respiratory: No cough, dyspnea Urologic: No hematuria, dysuria Abdominal:   No nausea, vomiting, diarrhea, bright red blood per rectum, melena, or hematemesis Neurologic:  No visual changes, wkns, changes in mental status. All other systems reviewed and are otherwise negative except as noted above.  Physical Exam  Blood pressure 78/48, pulse 127, temperature 98.2 F (36.8 C), temperature source Oral, resp. rate 26, height 5\' 6"  (1.676 m), weight 204 lb (92.534 kg), SpO2 93.00%.  General: Pleasant, NAD, obese Psych: Normal affect. Neuro: Alert and oriented X 3. Moves all extremities spontaneously. HEENT: Normal  Neck: Supple without bruits or JVD. Lungs:  Resp regular and unlabored, CTA. Heart: RR no s3, s4, or murmurs. Abdomen: Soft, non-tender, non-distended, BS + x 4.  Extremities: No clubbing, cyanosis or edema. DP/PT/Radials 2+ and equal bilaterally.  Labs  No results found for this basename: CKTOTAL:4,CKMB:4,TROPONINI:4 in the last 72 hours Lab Results  Component Value Date   WBC 10.7* 05/06/2012   HGB 16.2* 05/06/2012   HCT 47.2* 05/06/2012   MCV 99.4 05/06/2012   PLT 268 05/06/2012    Lab 05/06/12 1458  NA 140  K 3.0*  CL 95*  CO2 29  BUN 11  CREATININE 1.75*  CALCIUM 10.5  PROT --  BILITOT --  ALKPHOS --  ALT --  AST --  GLUCOSE 93   Lab Results  Component Value Date   CHOL 190 04/12/2012   HDL 35.70* 04/12/2012   LDLCALC 115* 04/12/2012   TRIG 195.0* 04/12/2012   Lab Results  Component Value Date   DDIMER 0.51* 09/12/2011    Radiology/Studies  Dg Chest Port  1 View  05/06/2012  *RADIOLOGY REPORT*  Clinical Data: Shortness of breath.  PORTABLE CHEST - 1 VIEW  Comparison: 03/21/2012  Findings: Heart and mediastinal contours are within normal limits. No focal opacities or effusions.  No acute bony abnormality.  IMPRESSION: No active cardiopulmonary disease.   Original Report Authenticated By: Cyndie Chime, M.D.     ECG  Sinus tachycardia no ST segment changes  ASSESSMENT AND PLAN  She is severely dehydrated from HCTZ being increased to 25 mg a day, poor water intake with increased caffeine use. She has had chest  pressure all day but her troponin is 0. She's had a false positive d-dimer in the past with a negative CT angiogram in February of 13. I see that this is also been ordered by the ED.  We will hydrate her with 2 L of normal saline. She has a normal left ventricular ejection fraction on recent echocardiography. We'll discharge her on no HCTZ and will continue with her lisinopril 40 mg a day. We'll followup blood pressure check in the office early next week with a metabolic profile. She may need when necessary Lasix for edema which she reports in the past. She will also need about 60 mEq of potassium. We'll discuss with Dr. Ellin Mayhew with Dr Rennis Chris and ER staff.  I hada long talk with the patient and her husband about self-destructive behavior. Is very clear that she will continue to do poorly with the possibility of a cardiovascular event such as stroke or myocardial infarction not to mention kidney damage continues with her current lifestyle and lack of discipline.   Signed, Valera Castle, MD 05/06/2012, 6:02 PM

## 2012-05-06 NOTE — ED Provider Notes (Signed)
Medical screening examination/treatment/procedure(s) were conducted as a shared visit with non-physician practitioner(s) and myself.  I personally evaluated the patient during the encounter  Doug Sou, MD 05/06/12 [phone]

## 2012-05-06 NOTE — Telephone Encounter (Signed)
New Problem:    Patient called in because she is having trouble regulating her BP and has SOB and feels weak.  Please call back.

## 2012-05-06 NOTE — ED Notes (Signed)
Pt for discharge.Vital signs stable and GCS 15 

## 2012-05-06 NOTE — ED Provider Notes (Signed)
Patient developed dizziness i.e. lightheadedness onset upon awakening this morning accompanied by shortness of breath no other complaint no chest pain no abdominal pain no blood per rectum. On exam patient in no distress speaks in paragraphs lungs clear auscultation heart tachycardic regular rhythm bilateral lower extremities with trace pretibial pitting edema  Doug Sou, MD 05/06/12 1528

## 2012-05-06 NOTE — Telephone Encounter (Signed)
[name] was notified that she is on her way to the ER.

## 2012-05-06 NOTE — ED Provider Notes (Signed)
Medical screening examination/treatment/procedure(s) were conducted as a shared visit with non-physician practitioner(s) and myself.  I personally evaluated the patient during the encounter  Doug Sou, MD 05/06/12 2342

## 2012-05-06 NOTE — ED Notes (Addendum)
Pt has been feeling weak and generally unwell since yesterday.  Pt gets dizzy when getting up.  No fever.  Pt checked her VS at home and found that she has HR in 140's and "low BP.  No CP with this.  Pt feels palpitations and its "hard to to breathe", pt reports SOB began today

## 2012-05-09 ENCOUNTER — Other Ambulatory Visit (INDEPENDENT_AMBULATORY_CARE_PROVIDER_SITE_OTHER): Payer: PRIVATE HEALTH INSURANCE

## 2012-05-09 ENCOUNTER — Ambulatory Visit (INDEPENDENT_AMBULATORY_CARE_PROVIDER_SITE_OTHER): Payer: PRIVATE HEALTH INSURANCE

## 2012-05-09 ENCOUNTER — Telehealth: Payer: Self-pay

## 2012-05-09 ENCOUNTER — Other Ambulatory Visit: Payer: PRIVATE HEALTH INSURANCE

## 2012-05-09 VITALS — BP 102/76 | HR 76 | Ht 66.0 in | Wt 214.0 lb

## 2012-05-09 DIAGNOSIS — I10 Essential (primary) hypertension: Secondary | ICD-10-CM

## 2012-05-09 LAB — BASIC METABOLIC PANEL
BUN: 14 mg/dL (ref 6–23)
Calcium: 9.4 mg/dL (ref 8.4–10.5)
Creatinine, Ser: 1.7 mg/dL — ABNORMAL HIGH (ref 0.4–1.2)
GFR: 32.45 mL/min — ABNORMAL LOW (ref >60.00)
Glucose, Bld: 118 mg/dL — ABNORMAL HIGH (ref 70–99)
Sodium: 144 mEq/L (ref 135–145)

## 2012-05-09 NOTE — Progress Notes (Signed)
Dr.Wall called office this morning 05/09/12 wanting patient to have a B/P and BMET today.States he saw patient in ER 05/06/12.Patient came to office B/P check and Bmet done.

## 2012-05-09 NOTE — Telephone Encounter (Signed)
Received message from Lela stating Dr.Wall called this morning. Stated he saw patient in ER this past Friday 10/11 and wants patient to come to office today 10/14 to have  B/P check and  BMET.Patient called and told.Appoinment scheduled today 05/09/12 at 2:00 pm.

## 2012-05-10 NOTE — Telephone Encounter (Signed)
New problem:  Test results from yesterday.

## 2012-05-10 NOTE — Telephone Encounter (Signed)
Left pt a message to call back. 

## 2012-05-10 NOTE — Telephone Encounter (Signed)
LMTCB

## 2012-05-11 NOTE — Telephone Encounter (Signed)
F/u  Patient f/u on the status of nurse return call, she can be reached at hm#

## 2012-05-11 NOTE — Telephone Encounter (Signed)
F/U  Returning nurse call from yesterday.

## 2012-05-11 NOTE — Telephone Encounter (Signed)
Spoke with pt

## 2012-05-16 ENCOUNTER — Encounter (HOSPITAL_COMMUNITY): Payer: Self-pay | Admitting: Emergency Medicine

## 2012-05-16 ENCOUNTER — Ambulatory Visit (INDEPENDENT_AMBULATORY_CARE_PROVIDER_SITE_OTHER): Payer: PRIVATE HEALTH INSURANCE | Admitting: Nurse Practitioner

## 2012-05-16 ENCOUNTER — Telehealth: Payer: Self-pay | Admitting: *Deleted

## 2012-05-16 ENCOUNTER — Emergency Department (HOSPITAL_COMMUNITY): Payer: PRIVATE HEALTH INSURANCE

## 2012-05-16 ENCOUNTER — Emergency Department (HOSPITAL_COMMUNITY)
Admission: EM | Admit: 2012-05-16 | Discharge: 2012-05-16 | Disposition: A | Discharge status: Home / Self Care | Payer: PRIVATE HEALTH INSURANCE | Attending: Emergency Medicine | Admitting: Emergency Medicine

## 2012-05-16 ENCOUNTER — Encounter: Payer: Self-pay | Admitting: Nurse Practitioner

## 2012-05-16 VITALS — BP 100/60 | HR 80 | Ht 66.0 in | Wt 209.0 lb

## 2012-05-16 DIAGNOSIS — F319 Bipolar disorder, unspecified: Secondary | ICD-10-CM | POA: Insufficient documentation

## 2012-05-16 DIAGNOSIS — Z79899 Other long term (current) drug therapy: Secondary | ICD-10-CM | POA: Insufficient documentation

## 2012-05-16 DIAGNOSIS — E119 Type 2 diabetes mellitus without complications: Secondary | ICD-10-CM | POA: Insufficient documentation

## 2012-05-16 DIAGNOSIS — I951 Orthostatic hypotension: Secondary | ICD-10-CM

## 2012-05-16 DIAGNOSIS — E785 Hyperlipidemia, unspecified: Secondary | ICD-10-CM | POA: Insufficient documentation

## 2012-05-16 DIAGNOSIS — Y9301 Activity, walking, marching and hiking: Secondary | ICD-10-CM | POA: Insufficient documentation

## 2012-05-16 DIAGNOSIS — J4489 Other specified chronic obstructive pulmonary disease: Secondary | ICD-10-CM | POA: Insufficient documentation

## 2012-05-16 DIAGNOSIS — I10 Essential (primary) hypertension: Secondary | ICD-10-CM | POA: Insufficient documentation

## 2012-05-16 DIAGNOSIS — K219 Gastro-esophageal reflux disease without esophagitis: Secondary | ICD-10-CM | POA: Insufficient documentation

## 2012-05-16 DIAGNOSIS — J449 Chronic obstructive pulmonary disease, unspecified: Secondary | ICD-10-CM | POA: Insufficient documentation

## 2012-05-16 DIAGNOSIS — W1809XA Striking against other object with subsequent fall, initial encounter: Secondary | ICD-10-CM | POA: Insufficient documentation

## 2012-05-16 DIAGNOSIS — Y929 Unspecified place or not applicable: Secondary | ICD-10-CM | POA: Insufficient documentation

## 2012-05-16 DIAGNOSIS — I959 Hypotension, unspecified: Secondary | ICD-10-CM

## 2012-05-16 DIAGNOSIS — Z8719 Personal history of other diseases of the digestive system: Secondary | ICD-10-CM | POA: Insufficient documentation

## 2012-05-16 DIAGNOSIS — W19XXXA Unspecified fall, initial encounter: Secondary | ICD-10-CM

## 2012-05-16 DIAGNOSIS — I252 Old myocardial infarction: Secondary | ICD-10-CM | POA: Insufficient documentation

## 2012-05-16 DIAGNOSIS — Z87891 Personal history of nicotine dependence: Secondary | ICD-10-CM | POA: Insufficient documentation

## 2012-05-16 DIAGNOSIS — E860 Dehydration: Secondary | ICD-10-CM | POA: Insufficient documentation

## 2012-05-16 DIAGNOSIS — F1910 Other psychoactive substance abuse, uncomplicated: Secondary | ICD-10-CM | POA: Insufficient documentation

## 2012-05-16 DIAGNOSIS — Z8679 Personal history of other diseases of the circulatory system: Secondary | ICD-10-CM | POA: Insufficient documentation

## 2012-05-16 DIAGNOSIS — D649 Anemia, unspecified: Secondary | ICD-10-CM | POA: Insufficient documentation

## 2012-05-16 DIAGNOSIS — I2510 Atherosclerotic heart disease of native coronary artery without angina pectoris: Secondary | ICD-10-CM | POA: Insufficient documentation

## 2012-05-16 DIAGNOSIS — S060X9A Concussion with loss of consciousness of unspecified duration, initial encounter: Secondary | ICD-10-CM

## 2012-05-16 DIAGNOSIS — S060XAA Concussion with loss of consciousness status unknown, initial encounter: Secondary | ICD-10-CM | POA: Insufficient documentation

## 2012-05-16 DIAGNOSIS — Z7982 Long term (current) use of aspirin: Secondary | ICD-10-CM | POA: Insufficient documentation

## 2012-05-16 LAB — CBC
HCT: 45 % (ref 36.0–46.0)
Platelets: 279 10*3/uL (ref 150–400)
RDW: 13.1 % (ref 11.5–15.5)
WBC: 11.7 10*3/uL — ABNORMAL HIGH (ref 4.0–10.5)

## 2012-05-16 LAB — BASIC METABOLIC PANEL
BUN: 17 mg/dL (ref 6–23)
Chloride: 93 mEq/L — ABNORMAL LOW (ref 96–112)
GFR calc Af Amer: 46 mL/min — ABNORMAL LOW (ref >90)
GFR calc non Af Amer: 40 mL/min — ABNORMAL LOW (ref >90)
Potassium: 4 mEq/L (ref 3.5–5.1)
Sodium: 135 mEq/L (ref 135–145)

## 2012-05-16 MED ORDER — LISINOPRIL 40 MG PO TABS: 20.0000 mg | ORAL_TABLET | Freq: Every day | ORAL | Status: DC

## 2012-05-16 MED ORDER — TRAMADOL HCL 50 MG PO TABS
50.0000 mg | ORAL_TABLET | Freq: Once | ORAL | Status: AC
  Administered 2012-05-16: 50 mg via ORAL
  Filled 2012-05-16: qty 1

## 2012-05-16 MED ORDER — ONDANSETRON HCL 4 MG/2ML IJ SOLN
4.0000 mg | Freq: Once | INTRAMUSCULAR | Status: AC
  Administered 2012-05-16: 4 mg via INTRAVENOUS
  Filled 2012-05-16: qty 2

## 2012-05-16 MED ORDER — SODIUM CHLORIDE 0.9 % IV BOLUS (SEPSIS)
2000.0000 mL | Freq: Once | INTRAVENOUS | Status: AC
  Administered 2012-05-16: 2000 mL via INTRAVENOUS

## 2012-05-16 NOTE — Patient Instructions (Signed)
We are going to send you to the ER for a CT of your head and IV fluids.  We would recommend stopping your Norvasc and cutting your Lisinopril in half.  Once out of the ER, you need to monitor your blood pressure closely and have close follow up here in the office.   Call the [facility] office at [phone] if you have any questions, problems or concerns.

## 2012-05-16 NOTE — ED Notes (Signed)
The patient is AOx4 and comfortable with her discharge instructions. 

## 2012-05-16 NOTE — Addendum Note (Signed)
Addended by: Rosalio Macadamia on: 05/16/2012 03:02 PM   Modules accepted: Orders

## 2012-05-16 NOTE — ED Notes (Signed)
Pt sent to ED from her MD's office ref. Hypotension and recommendation for a CT due to a fall on Sat.  Pt only c/o headache at this time

## 2012-05-16 NOTE — ED Provider Notes (Signed)
History     CSN: [id number]  Arrival date & time 05/16/12  1515   First MD Initiated Contact with Patient 05/16/12 1705      Chief Complaint  Patient presents with  . Hypotension    (Consider location/radiation/quality/duration/timing/severity/associated sxs/prior treatment) HPI CC: Low blood pressure, HA   Pt is a 56yo F here today complaining of low blood pressure and headache since Saturday. Pt recently evaluated on 10/11 for hypotension which responded to 4-5L IVF. Pt had felt well since that time w/o any orthostasis or low blood pressures at home (SBP typically in 110-120s), until Saturday. Pt had been sitting down when she got up to walk to the car. She took about 10 steps, felt lightheaded and fell into the car. She hit her head on the side of the car door but did not lose consciousness. Has had HA since that time that along with feeling "out of it or foggy," w/ associated photophobia. Tylenol 1000mg  w/o benefit. Improves w/ rest. Some nausea when HA is at its worst. Denies emesis, fever, CP, SOB, AMS, palpitations. Pt has had decreased PO for solids at this time but continues to drink lots of water. Has decreased her soda intake considerably since last ED visit. Pt did take all of her BP meds this am (Norvasc, Lisinopril, Metoprolol).    Past Medical History  Diagnosis Date  . Anemia, unspecified   . Major depressive disorder, single episode, unspecified   . Other, mixed, or unspecified nondependent drug abuse, unspecified   . Bipolar disorder, unspecified   . Dissociative identity disorder   . Unspecified essential hypertension   . Other and unspecified hyperlipidemia   . Esophageal reflux   . CAD (coronary artery disease)     a. 11/2004 - Cath showed non-obstuctive disease b. 09/2005 - PCI to mid LAD c. 07/2010 Cath revealed non obstructive disease with a patent LAD stent.   . Diabetes mellitus   . Hiatal hernia   . Asthma   . Chronic airway obstruction, not elsewhere  classified   . Myocardial infarction   . Palpitations     a. Holter (12/11) with occasional PVCs. Holter (11/12): rare PACs and PVCs.  . Diastolic dysfunction     a. Echo 12/24/11: Mild LVH, EF 55-60%, grade 1 diastolic dysfunction    Past Surgical History  Procedure Date  . Cesarean section   . Bladder surgery     Tack  . Nephrectomy     Part of left kidney  . Tonsillectomy and adenoidectomy   . Carpal tunnel release     BILATERAL HANDS  . Coronary stent placement 2007    Family History  Problem Relation Age of Onset  . Emphysema Mother   . Ovarian cancer Mother   . Heart disease Father     mi age 57  . Heart attack Sister 30  . Heart attack Brother 30  . Colon cancer Neg Hx     History  Substance Use Topics  . Smoking status: Former Smoker -- 40 years    Types: Cigarettes    Quit date: 07/07/2010  . Smokeless tobacco: Never Used  . Alcohol Use: No    OB History    Grav Para Term Preterm Abortions TAB SAB Ect Mult Living                  Review of Systems  All other systems reviewed and are negative.    Allergies  Trazodone hcl; Cefpodoxime proxetil; Erythromycin ethylsuccinate;  and Perphenazine  Home Medications   Current Outpatient Rx  Name Route Sig Dispense Refill  . ALBUTEROL SULFATE HFA 108 (90 BASE) MCG/ACT IN AERS Inhalation Inhale 2 puffs into the lungs every 4 (four) hours as needed. For wheezing    . ASPIRIN EC 81 MG PO TBEC Oral Take 81 mg by mouth daily.    . BUDESONIDE-FORMOTEROL FUMARATE 160-4.5 MCG/ACT IN AERO Inhalation Inhale 2 puffs into the lungs 2 (two) times daily.    Marland Kitchen CLONAZEPAM 0.5 MG PO TABS Oral Take 0.5 mg by mouth 3 (three) times daily as needed. anxiety    . DULOXETINE HCL 60 MG PO CPEP Oral Take 60 mg by mouth daily.     Marland Kitchen LISINOPRIL 40 MG PO TABS Oral Take 40 mg by mouth daily.    Marland Kitchen LOXAPINE SUCCINATE 5 MG PO CAPS Oral Take 5 mg by mouth daily.    Marland Kitchen METFORMIN HCL 500 MG PO TABS Oral Take 500 mg by mouth every morning.      Marland Kitchen METOPROLOL SUCCINATE ER 50 MG PO TB24 Oral Take 50 mg by mouth daily. Take with or immediately following a meal.    . POTASSIUM CHLORIDE CRYS ER 20 MEQ PO TBCR Oral Take 40 mEq by mouth daily.     Marland Kitchen RANITIDINE HCL 150 MG PO TABS Oral Take 150 mg by mouth daily.     Marland Kitchen ROSUVASTATIN CALCIUM 40 MG PO TABS Oral Take 1 tablet (40 mg total) by mouth daily. 30 tablet 3  . TRIAZOLAM 0.25 MG PO TABS Oral Take 0.25 mg by mouth at bedtime as needed. For sleep    . NITROGLYCERIN 0.4 MG SL SUBL Sublingual Place 0.4 mg under the tongue every 5 (five) minutes as needed. For chest pain      BP 100/56  Pulse 76  Temp 98 F (36.7 C) (Oral)  Resp 18  SpO2 92%  Physical Exam  Nursing note and vitals reviewed. Constitutional: She is oriented to person, place, and time. She appears well-developed and well-nourished. No distress.  HENT:  Head: Normocephalic and atraumatic.  Eyes: Pupils are equal, round, and reactive to light.  Neck: Normal range of motion.  Cardiovascular: Normal rate and intact distal pulses.   Pulmonary/Chest: No respiratory distress.  Abdominal: Normal appearance. She exhibits no distension.  Musculoskeletal: Normal range of motion.       HA worsens on straining against pressure of hand on head w/ side to side movement  Neurological: She is alert and oriented to person, place, and time. No cranial nerve deficit. She exhibits normal muscle tone. Coordination normal.       Romberg negative  Skin: Skin is warm and dry. No rash noted.  Psychiatric: She has a normal mood and affect. Her behavior is normal.    ED Course  Procedures (including critical care time)   Labs Reviewed  CBC  BASIC METABOLIC PANEL   No results found.   No diagnosis found.    MDM  56yo F w/ likely concussion and orthostatic hypotension likely secondary to overmedication and decreased volume. SBP from sitting to standing w/ 20point drop and stable HR. EKG normal. Do not think pt needs head scan at  this time as mentating well and no change in status since initial injury on Saturday.  - BMET - CBC - 2L NS bolus -  Tramadol for HA as tylenol w/o benefit and elevated Cr.   Update: Symptoms much improved after nearly 2L NS. CT scan wnl. Renal  fxn improved but still elevated. Pt experienced emesis w/ trying to take tramadol immediately after coming back from CT. - Zofran and repeat tramadol.    Update: Concussion and orthostasis secondary to hypovolemia and medication induced. Improved w/ fluids, zofran, and tramadol. Recommend pt to decrease Lisinopril by half and stop Norvasc dose.  - Pt to f/u w/ PCP later this week. - Stable for DC - Handout given       Ozella Rocks, MD 05/16/12 2029  Ozella Rocks, MD 05/16/12 2037

## 2012-05-16 NOTE — Progress Notes (Signed)
[name] Date of Birth: Dec 10, 1955 Medical Record #[id number]  History of Present Illness: [name] is seen back today for a work in visit. She is seen for [name]. She has CAD and COPD. Her last cath was in 2012 and showed patent LAD stent. Back in the spring she had a lot of trouble with orthostatic lightheadedness. She had had an episode of syncope. There was concern that her psych meds were causing the orthostasis which have since been changed. She chronically has had rare chest pressure after walking long distances. Her echo this past May showed preserved EF and Lexiscan this past June was normal.   Last month her blood pressure was running high and she has had her HCTZ restarted and the Lisinopril restared. She had had significant salt use. She was in the ER 10 days ago with chest pain/tachycardia and low BP. Dr. Daleen [name] saw her. She was orthostatic again. He noted "self destructive" behaviors with an overall poor choice of lifestyle/discipline. She had had poor water intake with increased caffeine and was severely dehydrated. Troponin was negative.  HCTZ was stopped and recommended to not be resumed. She had a BP check here afterwards in the office which was ok.   She comes back today after calling in this am. She was actually due for her routine visit here tomorrow. Her readings are back in the 80's systolic. She has continued to take all of her medicines. She says that after her ER visit, she has done a much better job in caring for herself. She has stopped the soda. She is only drinking water. She is eating less sugars. She actually began to feel better. Her husband notes she seemed more "lively". This past Saturday, she was actually babysitting. She got up to leave and when she got outside, she fell and hit the car. She does not remember much of the event. Since then, she is more lethargic. Feels weak. Feels nauseous. No vomiting. Blood pressure started dropping yesterday.  Her blood pressure here today is 75 systolic. She has continued to take all of her medicines.   Current Outpatient Prescriptions on File Prior to Visit  Medication Sig Dispense Refill  . albuterol (PROVENTIL HFA;VENTOLIN HFA) 108 (90 BASE) MCG/ACT inhaler Inhale 2 puffs into the lungs every 4 (four) hours as needed. For wheezing      . amLODipine (NORVASC) 5 MG tablet Take 1 tablet (5 mg total) by mouth daily.  30 tablet  11  . aspirin EC 81 MG tablet Take 81 mg by mouth daily.      . budesonide-formoterol (SYMBICORT) 160-4.5 MCG/ACT inhaler Inhale 2 puffs into the lungs 2 (two) times daily.      . DULoxetine (CYMBALTA) 60 MG capsule Take 60 mg by mouth daily.       Marland Kitchen lisinopril (PRINIVIL,ZESTRIL) 40 MG tablet Take 1 tablet (40 mg total) by mouth daily.  30 tablet  3  . loxapine (LOXITANE) 5 MG capsule Take 5 mg by mouth daily.      . metFORMIN (GLUCOPHAGE) 500 MG tablet Take 500 mg by mouth every morning.       . metoprolol succinate (TOPROL-XL) 50 MG 24 hr tablet Take 50 mg by mouth daily. Take with or immediately following a meal.      . nitroGLYCERIN (NITROSTAT) 0.4 MG SL tablet Place 0.4 mg under the tongue every 5 (five) minutes as needed. For chest pain      . potassium chloride SA (K-DUR,KLOR-CON) 20  MEQ tablet Take 40 mEq by mouth daily.       Marland Kitchen Propylene Glycol (SYSTANE BALANCE) 0.6 % SOLN Apply 2 drops to eye 3 (three) times daily.      . ranitidine (ZANTAC) 150 MG tablet Take 150 mg by mouth daily.       . rosuvastatin (CRESTOR) 40 MG tablet Take 1 tablet (40 mg total) by mouth daily.  30 tablet  3    Allergies  Allergen Reactions  . Trazodone Hcl Anaphylaxis  . Cefpodoxime Proxetil Other (See Comments)    unknown  . Erythromycin Ethylsuccinate Nausea And Vomiting  . Perphenazine Other (See Comments)    unknown    Past Medical History  Diagnosis Date  . Anemia, unspecified   . Major depressive disorder, single episode, unspecified   . Other, mixed, or unspecified  nondependent drug abuse, unspecified   . Bipolar disorder, unspecified   . Dissociative identity disorder   . Unspecified essential hypertension   . Other and unspecified hyperlipidemia   . Esophageal reflux   . CAD (coronary artery disease)     a. 11/2004 - Cath showed non-obstuctive disease b. 09/2005 - PCI to mid LAD c. 07/2010 Cath revealed non obstructive disease with a patent LAD stent.   . Diabetes mellitus   . Hiatal hernia   . Asthma   . Chronic airway obstruction, not elsewhere classified   . Myocardial infarction   . Palpitations     a. Holter (12/11) with occasional PVCs. Holter (11/12): rare PACs and PVCs.  . Diastolic dysfunction     a. Echo 12/24/11: Mild LVH, EF 55-60%, grade 1 diastolic dysfunction    Past Surgical History  Procedure Date  . Cesarean section   . Bladder surgery     Tack  . Nephrectomy     Part of left kidney  . Tonsillectomy and adenoidectomy   . Carpal tunnel release     BILATERAL HANDS  . Coronary stent placement 2007    History  Smoking status  . Former Smoker -- 40 years  . Types: Cigarettes  . Quit date: 07/07/2010  Smokeless tobacco  . Never Used    History  Alcohol Use No    Family History  Problem Relation Age of Onset  . Emphysema Mother   . Ovarian cancer Mother   . Heart disease Father     mi age 18  . Heart attack Sister 30  . Heart attack Brother 30  . Colon cancer Neg Hx     Review of Systems: The review of systems is per the HPI.  All other systems were reviewed and are negative.  Physical Exam: BP 100/60  Pulse 80  Ht 5\' 6"  (1.676 m)  Wt 209 lb (94.802 kg)  BMI 33.73 kg/m2 Blood pressure by me is just 75 systolic. Her cuff correlates well.  Patient is iin no acute distress. Her affect is flat. She has no gross neuro changes. Skin is warm and dry. Color is normal.  HEENT is unremarkable. Normocephalic/atraumatic. PERRL. Sclera are nonicteric. Neck is supple. No masses. No JVD. Lungs are clear. Cardiac exam  shows a regular rate and rhythm. Abdomen is soft. Extremities are without edema. Gait and ROM are intact. No gross neurologic deficits noted.   LABORATORY DATA:  Lab Results  Component Value Date   WBC 10.7* 05/06/2012   HGB 16.2* 05/06/2012   HCT 47.2* 05/06/2012   PLT 268 05/06/2012   GLUCOSE 118* 05/09/2012   CHOL 190 04/12/2012  TRIG 195.0* 04/12/2012   HDL 35.70* 04/12/2012   LDLDIRECT 251.2 01/25/2012   LDLCALC 115* 04/12/2012   ALT 12 04/12/2012   AST 18 04/12/2012   NA 144 05/09/2012   K 4.4 05/09/2012   CL 104 05/09/2012   CREATININE 1.7* 05/09/2012   BUN 14 05/09/2012   CO2 33* 05/09/2012   TSH 1.87 12/24/2011   INR 1.00 05/18/2011   HGBA1C 5.9* 09/13/2011    Assessment / Plan: 1. Fall - she states she hit her head. She complains of nausea. Her husband thinks she is more lethargic as well.  2. Hypotension - She has been doing a much better in regards to lifestyle changes which seem to make a significant impact on her blood pressure. Now her blood pressure is low. I think she could stop the Norvasc and cut the Lisinopril to half dose but will need close monitoring. She is referred to the ED for IV hydration.  I have talked with Dr. Patty Sermons (DOD). We feel she needs to go on to the ER for IV fluids and a CT of her head. Her blood pressure is quite low here in the office today.   Patient is agreeable to this plan and will call if any problems develop in the interim.

## 2012-05-16 NOTE — ED Notes (Signed)
On giving the pt her tramadol she vomited it out.  Tablet wasted with Lubertha Basque as witness.

## 2012-05-16 NOTE — Telephone Encounter (Signed)
pt notified about lab results, pt then states to me that her BP last night was 82/60; today @ 9 am 85/45. pt was scheduled to see SW, PA 10/22 now coming in today to see LG, NP @ 2 pm. pt advised to increase fluids and salt intake, pt verbalized understanding today

## 2012-05-16 NOTE — ED Notes (Signed)
Advised patient to let me know when her nausea is settled enough to take her po pain med.

## 2012-05-16 NOTE — ED Provider Notes (Signed)
I saw and evaluated the patient, reviewed the resident's note and I agree with the findings and plan. Pt from pcp office, bp low. Pt had taken all bp meds together this am, denies taking any extra meds. Iv ns. Labs. Recheck bp.   Suzi Roots, MD 05/16/12 [phone]

## 2012-05-16 NOTE — Telephone Encounter (Signed)
Message copied by Tarri Fuller on Mon May 16, 2012  9:42 AM ------      Message from: Barrie Folk      Created: Fri May 13, 2012  6:38 PM       No answer at home. No voicemail. Pt has appt with Lorin Picket PA on 05/17/12      Mylo Red RN

## 2012-05-17 ENCOUNTER — Ambulatory Visit: Payer: PRIVATE HEALTH INSURANCE | Admitting: Physician Assistant

## 2012-05-17 ENCOUNTER — Telehealth: Payer: Self-pay | Admitting: *Deleted

## 2012-05-17 NOTE — Telephone Encounter (Signed)
tw pt will call back to make appt with Dr. Shirlee Latch in a month per Norma Fredrickson, NP

## 2012-05-23 ENCOUNTER — Ambulatory Visit (INDEPENDENT_AMBULATORY_CARE_PROVIDER_SITE_OTHER): Payer: PRIVATE HEALTH INSURANCE | Admitting: Nurse Practitioner

## 2012-05-23 ENCOUNTER — Telehealth: Payer: Self-pay | Admitting: Internal Medicine

## 2012-05-23 ENCOUNTER — Encounter: Payer: Self-pay | Admitting: Nurse Practitioner

## 2012-05-23 VITALS — BP 100/70 | HR 72 | Ht 65.0 in | Wt 209.0 lb

## 2012-05-23 DIAGNOSIS — R1310 Dysphagia, unspecified: Secondary | ICD-10-CM | POA: Insufficient documentation

## 2012-05-23 DIAGNOSIS — IMO0001 Reserved for inherently not codable concepts without codable children: Secondary | ICD-10-CM | POA: Insufficient documentation

## 2012-05-23 DIAGNOSIS — R112 Nausea with vomiting, unspecified: Secondary | ICD-10-CM

## 2012-05-23 MED ORDER — OMEPRAZOLE 20 MG PO CPDR: 20.0000 mg | DELAYED_RELEASE_CAPSULE | Freq: Every day | ORAL | Status: DC

## 2012-05-23 NOTE — Patient Instructions (Addendum)
Stop the Zantac. We have sent a prescription for Omeprazole to [facility] [address]. Stay on liquids today, soup broths, jello, popsicles until you get further instructions after your Endoscopy tomorrow 10-29. You have been scheduled for an endoscopy with propofol. Please follow written instructions given to you at your visit today. If you use inhalers (even only as needed) or a CPAP machine, please bring them with you on the day of your procedure.

## 2012-05-23 NOTE — Progress Notes (Signed)
05/23/2012 [name] [id number] [birth year]   History of Present Illness:  Patient is a 56 year old female with multiple medical problems including, but not limited to, COPD , bipolar disorder , diabetes, hypertension and CAD.  She is known to Dr. Marina Goodell for history of GERD / esophageal strictures. She had an upper endoscopy with dilation in 2007.  She presented June 2012 with recurrent dysphasia, regurgitation and pyrosis.  EGD with dilation July 2012 revealed an early stricture at  the gastroesophageal junction. Dilation was performed with a Maloney dilator. Following esophageal dilation patient did well from a swallowing standpoint until just this last week .   Patient fell at home on 10/21, patient thinks her BP got too low. She went to the emergency department where CTscan of head was negative for any acute abnormalities. Since the fall patient has had headaches as well as nausea. Her swallowing problem also began post fall.  Patient ate a piece of dry toast last week, she feels that it is still in her lodged in her esophagus . She is unable to eat anything as it causes nausea, vomiting, and mid chest pain. She can tolerate fluids though it does cause chest pain. Her PCP prescribed Zofran, it may the vomiting worse.  No recent antibiotics.  Patient stopped PPI several months ago due to cost. Pharmacist told her Prilosec would interact with one of her meds and suggested Zantac as alternative. Patient has been on BID Zantac.     Current Medications, Allergies, Past Medical History, Past Surgical History, Family History and Social History were reviewed in [employer] record.   Physical Exam: General: Well developed , white female in no acute distress Head: Normocephalic and atraumatic Eyes:  sclerae anicteric, conjunctiva pink  Ears: Normal auditory acuity Lungs: Clear throughout to auscultation Heart: Regular rate and rhythm Abdomen: Soft, non tender and non  distended. No masses, no hepatomegaly. Normal bowel sounds Musculoskeletal: Symmetrical with no gross deformities  Extremities: No edema  Neurological: Alert oriented x 4, grossly nonfocal Psychological:  Alert and cooperative. Normal mood and affect  Assessment and Recommendations:  1. Dysphasia / odynophagia. Symptoms began a few days ago post fall at home. Doubt related to fall. Patient off PPI for several months. We discussed treating her with PPI and Carafate for a few weeks with EGD being reserved for refractory symptoms. After discussion, it was decided to proceed with upper endoscopy with possible dilation. Patient feels too miserable to wait. In addition, she is unable to eat anything solid secondary to dysphasia as well as nausea. For further evaluation, patient will be scheduled for upper endoscopy with dilation. Procedure will be done with propofol. His Zantac will be discontinued, patient to start Nexium once daily before breakfast. Further recommendations to be made at time of upper endoscopy.  2. CAD, s/p stent 2007. She is followed by [facility] cardiology, per their last note patient had a negative Myoview June 2013.  4. COPD. patient denied any recent antibiotic use but it looks like cardiology gave her a week's worth of doxycycline (in addition to prednisone) for COPD exacerbation 04/14/12. Doubt esophageal ulceration (from doxycycline) causing #1 but this will be further evaluated at time of EGD tomorrow  5. colon cancer screening. Patient needs screening colonoscopy July 2017. Last colonoscopy done July 2012. Exam was incomplete and there was inadequate visualization secondary to poor prep. She will need a vigorous bowel prep for the next colonoscopy

## 2012-05-23 NOTE — Telephone Encounter (Signed)
Pt states it feels like there is something stuck in her throat. States she saw her PCP Sunday. She has vomited a couple of times but the feeling is still there in her throat. States she cannot eat any food, only sips of liquids. Requesting to be seen. Pt scheduled to see Willette Cluster NP today at 11am. Pt aware of appt date and time.

## 2012-05-24 ENCOUNTER — Ambulatory Visit (AMBULATORY_SURGERY_CENTER): Payer: PRIVATE HEALTH INSURANCE | Admitting: Internal Medicine

## 2012-05-24 ENCOUNTER — Encounter: Payer: Self-pay | Admitting: Internal Medicine

## 2012-05-24 ENCOUNTER — Other Ambulatory Visit: Payer: Self-pay | Admitting: *Deleted

## 2012-05-24 VITALS — BP 120/75 | HR 75 | Temp 98.4°F | Resp 20 | Ht 65.0 in | Wt 209.0 lb

## 2012-05-24 DIAGNOSIS — K219 Gastro-esophageal reflux disease without esophagitis: Secondary | ICD-10-CM

## 2012-05-24 DIAGNOSIS — K253 Acute gastric ulcer without hemorrhage or perforation: Secondary | ICD-10-CM

## 2012-05-24 DIAGNOSIS — K222 Esophageal obstruction: Secondary | ICD-10-CM

## 2012-05-24 DIAGNOSIS — R1310 Dysphagia, unspecified: Secondary | ICD-10-CM

## 2012-05-24 DIAGNOSIS — R112 Nausea with vomiting, unspecified: Secondary | ICD-10-CM | POA: Insufficient documentation

## 2012-05-24 LAB — GLUCOSE, CAPILLARY: Glucose-Capillary: 95 mg/dL (ref 70–99)

## 2012-05-24 MED ORDER — OMEPRAZOLE 40 MG PO CPDR: 40.0000 mg | DELAYED_RELEASE_CAPSULE | Freq: Two times a day (BID) | ORAL | Status: DC

## 2012-05-24 MED ORDER — SODIUM CHLORIDE 0.9 % IV SOLN: 500.0000 mL | INTRAVENOUS | Status: DC

## 2012-05-24 NOTE — Progress Notes (Signed)
agree

## 2012-05-24 NOTE — Op Note (Signed)
[facility] [address] [place], [address]   ENDOSCOPY PROCEDURE REPORT  PATIENT: [name], [name].  MR#: [id number] BIRTHDATE: [birth year] , 56  yrs. old GENDER: Female ENDOSCOPIST: Roxy Cedar, MD REFERRED BY:  .  Self / Office PROCEDURE DATE:  05/24/2012 PROCEDURE:  EGD w/ biopsy ASA CLASS:     Class III INDICATIONS:  dysphagia. MEDICATIONS: MAC sedation, administered by CRNA and propofol (Diprivan) 100mg  IV TOPICAL ANESTHETIC: Cetacaine Spray  DESCRIPTION OF PROCEDURE: After the risks benefits and alternatives of the procedure were thoroughly explained, informed consent was obtained.  The LB [id number] endoscope was introduced through the mouth and advanced to the second portion of the duodenum. Without limitations.  The instrument was slowly withdrawn as the mucosa was fully examined.    The esophagus revealed severe erosive esophagitis circumferentially involving the distal one half of the esophagus.  There was contact bleeding and significant edema.  Distal esophagus revealed stricture formation.  The stomach was remarkable for a 1 cm clean-based antral ulcer just proximal to the pylorus.  The remainder of the stomach , including retroflexed view, was unremarkable.  CLO biopsy.  The duodenal bulb and post bulbar duodenum were unremarkable.  Retroflexed views revealed no abnormalities.     The scope was then withdrawn from the patient and the procedure completed.  COMPLICATIONS: There were no complications. ENDOSCOPIC IMPRESSION: 1. Severe erosive esophagitis circumferentially involving the distal one half of the esophagus. 2. Esophageal stricture 3. 1 cm clean-based antral ulcer  RECOMMENDATIONS: 1.  Omeprazole 40 mg po bid ; #60; 11 refills 2.  Repeat upper endoscopy in 8 weeks 3.  Rx CLO if positive  REPEAT EXAM:  eSigned:  Roxy Cedar, MD 05/24/2012 9:15 AM   CC:The Patient and Roderick Pee, MD

## 2012-05-24 NOTE — Progress Notes (Signed)
Patient did not experience any of the following events: a burn prior to discharge; a fall within the facility; wrong site/side/patient/procedure/implant event; or a hospital transfer or hospital admission upon discharge from the facility. (G8907) Patient did not have preoperative order for IV antibiotic SSI prophylaxis. (G8918)  

## 2012-05-24 NOTE — Progress Notes (Signed)
CRANGRAPE JUICE GIVEN PRIOR TO DISCHARGE ENCOURAGED PATIENT TO EAT ON ARRIVAL HOME. PATIENT STATING SHE WILL USE GLUCERNA. INSTRUCTED NOT TO TAKE DIABETIC AM MEDS.

## 2012-05-24 NOTE — Patient Instructions (Addendum)
CALL TO SCHEDULE FOLLOW UP ENDOSCOPY THE  WEEK OF January 6,2013. [phone].     YOU HAD AN ENDOSCOPIC PROCEDURE TODAY AT THE [facility]: Refer to the procedure report that was given to you for any specific questions about what was found during the examination.  If the procedure report does not answer your questions, please call your gastroenterologist to clarify.  If you requested that your care partner not be given the details of your procedure findings, then the procedure report has been included in a sealed envelope for you to review at your convenience later.  YOU SHOULD EXPECT: Some feelings of bloating in the abdomen. Passage of more gas than usual.  Walking can help get rid of the air that was put into your GI tract during the procedure and reduce the bloating. If you had a lower endoscopy (such as a colonoscopy or flexible sigmoidoscopy) you may notice spotting of blood in your stool or on the toilet paper. If you underwent a bowel prep for your procedure, then you may not have a normal bowel movement for a few days.  DIET: Your first meal following the procedure should be a light meal and then it is ok to progress to your normal diet.  A half-sandwich or bowl of soup is an example of a good first meal.  Heavy or fried foods are harder to digest and may make you feel nauseous or bloated.  Likewise meals heavy in dairy and vegetables can cause extra gas to form and this can also increase the bloating.  Drink plenty of fluids but you should avoid alcoholic beverages for 24 hours.  ACTIVITY: Your care partner should take you home directly after the procedure.  You should plan to take it easy, moving slowly for the rest of the day.  You can resume normal activity the day after the procedure however you should NOT DRIVE or use heavy machinery for 24 hours (because of the sedation medicines used during the test).    SYMPTOMS TO REPORT IMMEDIATELY: A gastroenterologist can be  reached at any hour.  During normal business hours, 8:30 AM to 5:00 PM Monday through Friday, call [phone].  After hours and on weekends, please call the GI answering service at [phone] who will take a message and have the physician on call contact you.   Following upper endoscopy (EGD)  Vomiting of blood or coffee ground material  New chest pain or pain under the shoulder blades  Painful or persistently difficult swallowing  New shortness of breath  Fever of 100F or higher  Black, tarry-looking stools  FOLLOW UP: If any biopsies were taken you will be contacted by phone or by letter within the next 1-3 weeks.  Call your gastroenterologist if you have not heard about the biopsies in 3 weeks.  Our staff will call the home number listed on your records the next business day following your procedure to check on you and address any questions or concerns that you may have at that time regarding the information given to you following your procedure. This is a courtesy call and so if there is no answer at the home number and we have not heard from you through the emergency physician on call, we will assume that you have returned to your regular daily activities without incident.  SIGNATURES/CONFIDENTIALITY: You and/or your care partner have signed paperwork which will be entered into your electronic medical record.  These signatures attest to the fact that that  the information above on your After Visit Summary has been reviewed and is understood.  Full responsibility of the confidentiality of this discharge information lies with you and/or your care-partner.

## 2012-05-24 NOTE — Progress Notes (Signed)
PATIENT AND HUSBAND REFUSE APPOINTMENT IN 8 WEEKS FOR FOLLOW UP APPOINTMENT. PREFER AFTER HOLIDAYS,PERMISSION FOR 20 WEEKS BY DR. PERRY. UNABLE TO SCHEDULE AT THIS TIME, SCHEDULE NOT AVAILABLE. TELEPHONE NUMBER AND REMINDER FOR PATIENT.

## 2012-05-25 ENCOUNTER — Telehealth: Payer: Self-pay

## 2012-05-25 MED ORDER — OMEPRAZOLE 40 MG PO CPDR: 40.0000 mg | DELAYED_RELEASE_CAPSULE | Freq: Two times a day (BID) | ORAL | Status: DC

## 2012-05-25 NOTE — Telephone Encounter (Signed)
  Follow up Call-  Call back number 05/24/2012 01/26/2011  Post procedure Call Back phone  # [phone]  Permission to leave phone message Yes -     Patient questions:  Do you have a fever, pain , or abdominal swelling? no Pain Score  0 *  Have you tolerated food without any problems? yes  Have you been able to return to your normal activities? yes  Do you have any questions about your discharge instructions: Diet   no Medications  no Follow up visit  no  Do you have questions or concerns about your Care? no  Actions: * If pain score is 4 or above: No action needed, pain <4.  Per the pt, "I am much better today". Maw

## 2012-05-25 NOTE — Addendum Note (Signed)
Addended byDerry Skill on: 05/25/2012 10:38 AM   Modules accepted: Orders

## 2012-05-27 ENCOUNTER — Other Ambulatory Visit: Payer: Self-pay | Admitting: Cardiology

## 2012-05-27 DIAGNOSIS — R079 Chest pain, unspecified: Secondary | ICD-10-CM

## 2012-05-27 MED ORDER — NITROGLYCERIN 0.4 MG SL SUBL: 0.4000 mg | SUBLINGUAL_TABLET | SUBLINGUAL | Status: DC | PRN

## 2012-05-27 MED ORDER — POTASSIUM CHLORIDE CRYS ER 20 MEQ PO TBCR: 40.0000 meq | EXTENDED_RELEASE_TABLET | Freq: Every day | ORAL | Status: DC

## 2012-05-27 NOTE — Telephone Encounter (Signed)
**Note De-Identified  Obfuscation** Pt. states that her pcp, Dr. Ivory Broad, has recommended that she not take Metoprolol or Lisinopril anymore due to her 53 lb. weight loss and her BP remains normal at 106/70. Pt. Wants to know if Dr. Shirlee Latch agrees. Please advise.   Also, pt. states that she is changing PCP's and is now seeing Dr. Marina Goodell.

## 2012-05-27 NOTE — Telephone Encounter (Signed)
Pt now calling to say her medical dr advised her to stop all her heart meds, pls advise

## 2012-05-27 NOTE — Telephone Encounter (Signed)
Follow-up:    Patient called in again to follow-up on her initial refill request.

## 2012-05-27 NOTE — Telephone Encounter (Signed)
Pt needs refill

## 2012-05-29 NOTE — Telephone Encounter (Signed)
I would have her take meds as per the last note by PA Gerhardt: stop amlodipine, cut lisinopril in half.  Would continue on the metoprolol and the half dose of lisinopril as long as she is not lightheaded.

## 2012-05-30 ENCOUNTER — Ambulatory Visit: Payer: PRIVATE HEALTH INSURANCE | Admitting: Cardiology

## 2012-05-30 ENCOUNTER — Telehealth: Payer: Self-pay | Admitting: Internal Medicine

## 2012-05-30 NOTE — Telephone Encounter (Signed)
Spoke with pt and let her know she will be scheduled to have an EGD in 8 weeks. We will call pt with appt when scheduled.

## 2012-05-31 ENCOUNTER — Encounter: Payer: Self-pay | Admitting: *Deleted

## 2012-05-31 ENCOUNTER — Telehealth: Payer: Self-pay | Admitting: Internal Medicine

## 2012-05-31 NOTE — Telephone Encounter (Signed)
Pt states she is starting to have difficulty swallowing again. Pt is taking Omeprazole 40mg  BID but states it doesn't seem to be working. Offered pt an appt for today but she states she cannot come today. Pt scheduled to see Mike Gip PA tomorrow at 3:30pm. Pt aware of appt date and time.

## 2012-06-01 ENCOUNTER — Other Ambulatory Visit (INDEPENDENT_AMBULATORY_CARE_PROVIDER_SITE_OTHER): Payer: PRIVATE HEALTH INSURANCE

## 2012-06-01 ENCOUNTER — Encounter: Payer: Self-pay | Admitting: Physician Assistant

## 2012-06-01 ENCOUNTER — Ambulatory Visit (INDEPENDENT_AMBULATORY_CARE_PROVIDER_SITE_OTHER): Payer: PRIVATE HEALTH INSURANCE | Admitting: Physician Assistant

## 2012-06-01 ENCOUNTER — Ambulatory Visit: Payer: PRIVATE HEALTH INSURANCE | Admitting: Cardiology

## 2012-06-01 VITALS — BP 90/60 | HR 78 | Ht 65.0 in | Wt 215.6 lb

## 2012-06-01 DIAGNOSIS — R634 Abnormal weight loss: Secondary | ICD-10-CM

## 2012-06-01 DIAGNOSIS — K259 Gastric ulcer, unspecified as acute or chronic, without hemorrhage or perforation: Secondary | ICD-10-CM

## 2012-06-01 DIAGNOSIS — K222 Esophageal obstruction: Secondary | ICD-10-CM

## 2012-06-01 DIAGNOSIS — K2210 Ulcer of esophagus without bleeding: Secondary | ICD-10-CM

## 2012-06-01 DIAGNOSIS — R1010 Upper abdominal pain, unspecified: Secondary | ICD-10-CM

## 2012-06-01 DIAGNOSIS — R109 Unspecified abdominal pain: Secondary | ICD-10-CM

## 2012-06-01 DIAGNOSIS — R112 Nausea with vomiting, unspecified: Secondary | ICD-10-CM

## 2012-06-01 LAB — COMPREHENSIVE METABOLIC PANEL
AST: 20 U/L (ref 0–37)
Alkaline Phosphatase: 41 U/L (ref 39–117)
Glucose, Bld: 75 mg/dL (ref 70–99)
Potassium: 5.3 mEq/L — ABNORMAL HIGH (ref 3.5–5.1)
Sodium: 140 mEq/L (ref 135–145)
Total Bilirubin: 0.6 mg/dL (ref 0.3–1.2)
Total Protein: 6.4 g/dL (ref 6.0–8.3)

## 2012-06-01 LAB — CBC WITH DIFFERENTIAL/PLATELET
Eosinophils Absolute: 0.4 10*3/uL (ref 0.0–0.7)
Eosinophils Relative: 4.2 % (ref 0.0–5.0)
HCT: 38.6 % (ref 36.0–46.0)
Lymphs Abs: 2.5 10*3/uL (ref 0.7–4.0)
MCHC: 32.4 g/dL (ref 30.0–36.0)
MCV: 101.5 fl — ABNORMAL HIGH (ref 78.0–100.0)
Monocytes Absolute: 0.7 10*3/uL (ref 0.1–1.0)
Neutrophils Relative %: 60.4 % (ref 43.0–77.0)
Platelets: 246 10*3/uL (ref 150.0–400.0)
RDW: 14.4 % (ref 11.5–14.6)
WBC: 9.3 10*3/uL (ref 4.5–10.5)

## 2012-06-01 LAB — LIPASE: Lipase: 23 U/L (ref 11.0–59.0)

## 2012-06-01 MED ORDER — PROMETHAZINE HCL 25 MG PO TABS: ORAL_TABLET | ORAL | Status: DC

## 2012-06-01 NOTE — Patient Instructions (Addendum)
Please go to the basement level to have your labs drawn.  We sent prescription for Promethazine for nausea to [address]. Continue the Prilosec twice daily.  Push fluids. Elevate the head of your bed.    You have been scheduled for a CT scan of the abdomen and pelvis at [facility] ([address]---this is in the same building as [facility]).   You are scheduled on 06-06-2012 at 2:30 PM . You should arrive 2:15 PM  to your appointment time for registration. Please follow the written instructions below on the day of your exam:  WARNING: IF YOU ARE ALLERGIC TO IODINE/X-RAY DYE, PLEASE NOTIFY RADIOLOGY IMMEDIATELY AT [phone]! YOU WILL BE GIVEN A 13 HOUR PREMEDICATION PREP.  1) Do not eat or drink anything after 10;30 AM (4 hours prior to your test) 2) You have been given 2 bottles of oral contrast to drink. The solution may taste better if refrigerated, but do NOT add ice or any other liquid to this solution. Shake well before drinking.    Drink 1 bottle of contrast @ 12:30 PM  (2 hours prior to your exam)  Drink 1 bottle of contrast @ 1:30 PM  (1 hour prior to your exam)  You may take any medications as prescribed with a small amount of water except for the following: Metformin, Glucophage, Glucovance, Avandamet, Riomet, Fortamet, Actoplus Met, Janumet, Glumetza or Metaglip. The above medications must be held the day of the exam AND 48 hours after the exam.  The purpose of you drinking the oral contrast is to aid in the visualization of your intestinal tract. The contrast solution may cause some diarrhea. Before your exam is started, you will be given a small amount of fluid to drink. Depending on your individual set of symptoms, you may also receive an intravenous injection of x-ray contrast/dye. Plan on being at [facility] for 30 minutes or long, depending on the type of exam you are having performed.  This test typically takes 30-45  minutes to complete.  If you have any questions regarding your exam or if you need to reschedule, you may call the CT department at [phone] between the hours of 8:00 am and 5:00 pm, Monday-Friday.  ________________________________________________________________________

## 2012-06-02 ENCOUNTER — Encounter: Payer: Self-pay | Admitting: Physician Assistant

## 2012-06-02 NOTE — Progress Notes (Signed)
Subjective:    Patient ID: [name], female    DOB: [birth year], 56 y.o.   MRN: [id number]  HPI  [name] is a 56 year old female known recently to Dr. Marina Goodell,. She has multiple medical problems including COPD bipolar disorder adult-onset diabetes mellitus, history of hypertension and coronary artery disease. She has history of chronic GERD and prior esophageal stricture requiring dilation. She was seen in the office in October with complaints of dysphagia and odynophagia and then schedule for EGD which she underwent on 05/24/2012. She was found to have severe erosive esophagitis circumferentially involving the whole distal one half of her esophagus, the tissue was friable and edematous. There was a distal stricture as well as a 1 cm clean-based antral ulcer just proximal to the pylorus. Dilation was not done due to the severe esophagitis. She was placed on omeprazole 40 mg by mouth twice daily and plan was for repeat endoscopy in 8 weeks with dilation of the stricture. She called the office earlier today with complaints of persistent dysphagia and odynophagia as well as nausea and vomiting and abdominal pain. She states she has been taking her Prilosec 40 mg twice daily. She says she's been sticking very strictly to the diet and just has come off of all caffeine. She complains of feeling weak and nauseated. She says she still hurts with swallowing and has been vomiting off and on. She also complains of abdominal pain which has been persistent over several months. This seems to be located in the upper abdomen he has some radiation into her back. She says she's lost 53 pounds over the past 6 months. She's also developed problems with hypotension and has come off of all of her blood pressure meds. Her only prior abdominal surgery was a C-section.    Review of Systems  Constitutional: Positive for appetite change and fatigue.  HENT: Positive for trouble swallowing.   Eyes: Negative.     Respiratory: Negative.   Cardiovascular: Negative.   Gastrointestinal: Positive for nausea, vomiting and abdominal pain.  Genitourinary: Negative.   Musculoskeletal: Negative.   Skin: Negative.   Neurological: Positive for weakness.  Hematological: Negative.   Psychiatric/Behavioral: Negative.    Outpatient Prescriptions Prior to Visit  Medication Sig Dispense Refill  . albuterol (PROVENTIL HFA;VENTOLIN HFA) 108 (90 BASE) MCG/ACT inhaler Inhale 2 puffs into the lungs every 4 (four) hours as needed. For wheezing      . aspirin EC 81 MG tablet Take 81 mg by mouth daily.      . budesonide-formoterol (SYMBICORT) 160-4.5 MCG/ACT inhaler Inhale 2 puffs into the lungs 2 (two) times daily.      . clonazePAM (KLONOPIN) 0.5 MG tablet Take 0.5 mg by mouth 3 (three) times daily as needed. anxiety      . DULoxetine (CYMBALTA) 60 MG capsule Take 60 mg by mouth daily.       . metFORMIN (GLUCOPHAGE) 500 MG tablet Take 500 mg by mouth every morning.       . nitroGLYCERIN (NITROSTAT) 0.4 MG SL tablet Place 1 tablet (0.4 mg total) under the tongue every 5 (five) minutes as needed. For chest pain  40 tablet  6  . omeprazole (PRILOSEC) 40 MG capsule Take 1 capsule (40 mg total) by mouth 2 (two) times daily.  60 capsule  11  . potassium chloride SA (K-DUR,KLOR-CON) 20 MEQ tablet Take 2 tablets (40 mEq total) by mouth daily.  40 tablet  6  . rosuvastatin (CRESTOR) 40 MG tablet Take 1 tablet (  40 mg total) by mouth daily.  30 tablet  3  . triazolam (HALCION) 0.25 MG tablet Take 0.25 mg by mouth at bedtime as needed. For sleep      . promethazine (PHENERGAN) 25 MG tablet       . HYDROcodone-acetaminophen (VICODIN) 5-500 MG per tablet       . lisinopril (PRINIVIL,ZESTRIL) 40 MG tablet Take 40 mg by mouth daily.      Marland Kitchen loxapine (LOXITANE) 5 MG capsule Take 5 mg by mouth daily.      . metoprolol succinate (TOPROL-XL) 50 MG 24 hr tablet Take 25 mg by mouth daily. Take with or immediately following a meal.      .  ranitidine (ZANTAC) 150 MG tablet Take 150 mg by mouth daily.        Allergies  Allergen Reactions  . Trazodone Hcl Anaphylaxis  . Cefpodoxime Proxetil Other (See Comments)    unknown  . Erythromycin Ethylsuccinate Nausea And Vomiting  . Perphenazine Other (See Comments)    unknown   Patient Active Problem List  Diagnosis  . HYPERLIPIDEMIA  . ANEMIA, NORMOCYTIC  . DEPRESSION, MAJOR  . BIPOLAR DISORDER UNSPECIFIED  . MULTIPLE PERSONALITY DISORDER  . TOBACCO ABUSE  . NARCOTIC ABUSE  . HYPERTENSION  . CORONARY HEART DISEASE  . C O P D  . G E R D  . DYSPNEA  . PALPITATIONS  . OSA (obstructive sleep apnea)  . Chest pain  . Hypokalemia  . Tachycardia  . Uncontrolled hypertension  . Dizziness - light-headed  . Weakness  . Dysphagia, unspecified  . Nausea and vomiting   History  Substance Use Topics  . Smoking status: Former Smoker -- 40 years    Types: Cigarettes    Quit date: 07/07/2010  . Smokeless tobacco: Never Used  . Alcohol Use: No       Objective:   Physical Exam well-developed white female in no acute distress. Blood pressure 90/60 on recheck 100/70; pulse 78 height 5 foot 5 weight to 15. HEENT; nontraumatic normocephalic EOMI PERRLA sclera anicteric conjunctiva pink, Neck; supple no JVD, Cardiovascular; regular rate and rhythm with S1-S2 no murmur or gallop, Pulmonary; clear bilaterally, Abdomen; soft large she is tender in the upper abdomen also tender rather diffusely there is no guarding or rebound no palpable mass or hepatosplenomegaly no appreciable succussion splash, Rectal; exam not done, Extremities; no clubbing cyanosis or edema skin warm and dry, Psych; mood and affect normal and appropriate        Assessment & Plan:  #25 56 year old female with severe erosive esophagitis documented on EGD about one week ago, also with a benign distal esophageal stricture was not dilated due to the severity of the esophagitis. Patient has persistent dysphagia and  odynophagia she is not unexpected, however also complaining of nausea vomiting upper abdominal pain and has had a significant weight loss Will need to rule out gastroparesis, will also rule out other intra-abdominal pathology which may contribute to poor gastric emptying and secondary esophagitis.  #2 gastric antral ulcer benign #3 coronary artery disease #4 COPD #5 bipolar disorder with depression, and personality disorder  Plan; patient encouraged to push fluids and try 4- 5 small bland soft feedings per day She is encouraged to elevate the head of her bed Continue Prilosec 40 mg by mouth twice daily We'll refill Phenergan 12.5-25 mg every 6 hours as needed for nausea and vomiting Check labs today to include CBC with differential Cmet and lipase Schedule for CT  scan of the abdomen and pelvis with contrast She will need repeat EGD in about 7 weeks with Dr. Marina Goodell with esophageal dilation.

## 2012-06-02 NOTE — Progress Notes (Signed)
Patient known to me. Agree with assessment and plans. Discussed with PA.

## 2012-06-06 ENCOUNTER — Other Ambulatory Visit: Payer: PRIVATE HEALTH INSURANCE

## 2012-06-14 ENCOUNTER — Other Ambulatory Visit: Payer: PRIVATE HEALTH INSURANCE

## 2012-06-20 ENCOUNTER — Telehealth: Payer: Self-pay | Admitting: Internal Medicine

## 2012-06-20 ENCOUNTER — Telehealth: Payer: Self-pay | Admitting: Cardiology

## 2012-06-20 NOTE — Telephone Encounter (Signed)
Pt aware. Discussed with the pt reflux precautions. Pt verbalized understanding. Pt scheduled for followup EGD and previsit.

## 2012-06-20 NOTE — Telephone Encounter (Signed)
Pt had an EGD with Dr. Marina Goodell 05/24/12, she had severe erosive esophagitis. Pt states she is taking Omeprazole 40mg  BID. Pt reports she has taken Zofran and Phenergan for nausea but it isn't helping. States she only keeps liquids down. Pt reports that when she bends over she throws up, states she can lean over to empty the dishwasher and she vomits. Pt is due for a repeat EGD at the end of December. Pt wants to know what else she can do for nausea. Dr. Marina Goodell please advise.

## 2012-06-20 NOTE — Telephone Encounter (Signed)
I spoke with the pt and she states that her PCP (Dr Ivory Broad) stopped all of her BP medications a few weeks ago due to low BP and significant weight loss.  The pt said her BP today is 159/110 and would like to know what she should do about her medications.  At this time I instructed the pt to contact Dr Linda Hedges office for further recommendations about her BP medications.  If Cardiology is needed then the pt will contact our office. Pt agreed with plan.

## 2012-06-20 NOTE — Telephone Encounter (Signed)
Continue omeprazole 40 mg twice daily. Not sure what else to recommend a Zofran and Phenergan aren't helping for nausea. Strict reflux precautions. Keep appointment for followup endoscopy as planned

## 2012-06-20 NOTE — Telephone Encounter (Signed)
plz return call to pt [phone] regarding elevated BP

## 2012-06-22 ENCOUNTER — Encounter: Payer: Self-pay | Admitting: *Deleted

## 2012-06-22 ENCOUNTER — Other Ambulatory Visit (INDEPENDENT_AMBULATORY_CARE_PROVIDER_SITE_OTHER): Payer: PRIVATE HEALTH INSURANCE

## 2012-06-22 DIAGNOSIS — J4489 Other specified chronic obstructive pulmonary disease: Secondary | ICD-10-CM

## 2012-06-22 DIAGNOSIS — I10 Essential (primary) hypertension: Secondary | ICD-10-CM

## 2012-06-22 DIAGNOSIS — I2510 Atherosclerotic heart disease of native coronary artery without angina pectoris: Secondary | ICD-10-CM

## 2012-06-22 DIAGNOSIS — J449 Chronic obstructive pulmonary disease, unspecified: Secondary | ICD-10-CM

## 2012-06-22 LAB — HEPATIC FUNCTION PANEL
ALT: 11 U/L (ref 0–35)
AST: 21 U/L (ref 0–37)
Albumin: 3.5 g/dL (ref 3.5–5.2)
Alkaline Phosphatase: 46 U/L (ref 39–117)
Bilirubin, Direct: 0 mg/dL (ref 0.0–0.3)
Total Bilirubin: 0.5 mg/dL (ref 0.3–1.2)
Total Protein: 6.4 g/dL (ref 6.0–8.3)

## 2012-06-22 LAB — LIPID PANEL
Cholesterol: 138 mg/dL (ref 0–200)
HDL: 35.1 mg/dL — ABNORMAL LOW
LDL Cholesterol: 64 mg/dL (ref 0–99)
Total CHOL/HDL Ratio: 4
Triglycerides: 195 mg/dL — ABNORMAL HIGH (ref 0.0–149.0)
VLDL: 39 mg/dL (ref 0.0–40.0)

## 2012-06-22 LAB — BASIC METABOLIC PANEL
Chloride: 99 mEq/L (ref 96–112)
GFR: 48.21 mL/min — ABNORMAL LOW (ref >60.00)
Potassium: 5.1 mEq/L (ref 3.5–5.1)
Sodium: 141 mEq/L (ref 135–145)

## 2012-06-24 ENCOUNTER — Telehealth: Payer: Self-pay | Admitting: *Deleted

## 2012-06-24 DIAGNOSIS — I10 Essential (primary) hypertension: Secondary | ICD-10-CM

## 2012-06-24 DIAGNOSIS — R079 Chest pain, unspecified: Secondary | ICD-10-CM

## 2012-06-24 MED ORDER — POTASSIUM CHLORIDE CRYS ER 20 MEQ PO TBCR: 30.0000 meq | EXTENDED_RELEASE_TABLET | Freq: Every day | ORAL | Status: DC

## 2012-06-24 NOTE — Telephone Encounter (Signed)
pt notified about lab results and to decrease K+ to 30 meq daily, bmet 07/08/12, 06/27/12 appt with Bing Neighbors. PAC

## 2012-06-24 NOTE — Telephone Encounter (Signed)
Message copied by Tarri Fuller on Fri Jun 24, 2012 11:32 AM ------      Message from: [place], [place] T      Created: Fri Jun 24, 2012  8:15 AM       K+ on high side.      Chart indicates she takes K+ 40 mEq QD.      Decrease K+ to 30 mEq QD.      Check BMET in 2 weeks.      Tereso Newcomer, PA-C  8:15 AM 06/24/2012

## 2012-06-27 ENCOUNTER — Encounter: Payer: Self-pay | Admitting: Physician Assistant

## 2012-06-27 ENCOUNTER — Ambulatory Visit (INDEPENDENT_AMBULATORY_CARE_PROVIDER_SITE_OTHER): Payer: PRIVATE HEALTH INSURANCE | Admitting: Physician Assistant

## 2012-06-27 VITALS — BP 140/94 | HR 60 | Wt 201.0 lb

## 2012-06-27 DIAGNOSIS — I10 Essential (primary) hypertension: Secondary | ICD-10-CM

## 2012-06-27 DIAGNOSIS — E785 Hyperlipidemia, unspecified: Secondary | ICD-10-CM

## 2012-06-27 DIAGNOSIS — I2510 Atherosclerotic heart disease of native coronary artery without angina pectoris: Secondary | ICD-10-CM

## 2012-06-27 NOTE — Patient Instructions (Addendum)
Your physician recommends that you schedule a follow-up appointment in: 3 months with Dr Shirlee Latch or Tereso Newcomer, PA-C

## 2012-06-27 NOTE — Progress Notes (Signed)
[address], [address] [place], [place]  [address] Phone: [phone], Fax:  [phone]  Date:  06/27/2012   Name:  [name]   DOB:  February 09, 1956   MRN:  [id number]  PCP:  Garth Schlatter, MD  Primary Cardiologist:  Dr. Marca Ancona  Primary Electrophysiologist:  None    History of Present Illness: [name] is a 56 y.o. female who returns for follow up.  She has a hx of CAD, HTN, DM2, HL, depression, GERD and COPD.  Patient recently has had labile BPs.  She was seen by me in the spring with low BPs in the setting of several new psych meds.  These were felt to be contributing to this and they were changed.  Her BPs improved and then became elevated again.  She saw Dr. Marca Ancona in 9/13 with BP 160/101.  BP meds were adjusted.  She was seen in the ER on a couple of occasions after this visit with low BPs.  She had a fall on one occasion and was thought to have a concussion.  Of note, head CT 04/2012 was negative for anything acute.  She has had considerable weight loss over the last several mos.  She was ultimately seen by GI and EGD showed severe esophagitis and stricture.  She has been placed on high dose PPI with plans for follow up EGD in several weeks.  Her PCP recently d/c'd all of her BP meds.  Toprol was added back last week b/c her BP was creeping up.  Her BP at home since restarting has been 120-130s systolic.  She overall feels better.  The patient denies chest pain, significant shortness of breath, syncope, orthopnea, PND or significant pedal edema.   Labs (12/11): BNP 40  Labs (1/12): HCT 38, D dimer negative, LDL 70, HDL 41, creatinine 1.29, TSH  Labs (10/12): TSH normal, LDL 87, HDL 36, K 3.4, creatinine 7.42  Labs (7/13): LDL 251  Labs (9/13): LDL 595, TGs 195, HDL 36, LFTs normal, K 3.3, creatinine 1.2  Labs (11/13):  Tg 195, HDL 35.10, LDL 64, ALT 11, K 5.1, creatinine 1.2   Wt Readings from Last 3 Encounters:  06/27/12 201  lb (91.173 kg)  06/01/12 215 lb 9.6 oz (97.796 kg)  05/24/12 209 lb (94.802 kg)     Past Medical History  Diagnosis Date  . Anemia, unspecified   . Major depression   . Narcotic abuse in remission   . Bipolar disorder, unspecified   . Dissociative identity disorder   . HTN (hypertension), benign     a. meds d/c'd by PCP 11/13 due to low BP in setting of significant weight loss and poor appetite related to esophagitis (Toprol restarted late Nov 2013)  . HLD (hyperlipidemia)   . GERD (gastroesophageal reflux disease)   . CAD (coronary artery disease)     a. Cypher DES to mLAD in 2007; b. MV 3/09: EF 72%, no ischemia/infarction.; c. MV (12/11) EF 73%, ? Ant soft tissue atten, no ischemia/infarction; d. LHC (1/12): patent LAD stent, mild LIs only; e. Lex MV (6/13): no ischemia or infarction.   Marland Kitchen DM2 (diabetes mellitus, type 2)   . Hiatal hernia   . COPD (chronic obstructive pulmonary disease)     a. quit smoking 06/2010; b. PFTs (4/10):  FEV1 66%, ratio 64%, DLCO 67%  . Palpitations     a. Holter (12/11) with occasional PVCs. Holter (11/12): rare PACs and PVCs.  Marland Kitchen Hx  of echocardiogram     a. Echo (1/08): EF 65%, no significant valvular abnormalities.; b. Echo (12/11): EF 55-60%, grade II diast dysfn, normal wall motion, mild LAE.; c. Echo (5/13) with EF 55-60%, mild LVH.   Marland Kitchen Esophageal stricture     a. s/p dilatation 7/12;  b. EGD 04/2012 with severe esophagitis and stricture (PPI started and dilatation deferred for several weeks)  . H/O partial nephrectomy 1997  . H/O orthostatic hypotension     probably related to psych meds    Current Outpatient Prescriptions  Medication Sig Dispense Refill  . albuterol (PROVENTIL HFA;VENTOLIN HFA) 108 (90 BASE) MCG/ACT inhaler Inhale 2 puffs into the lungs every 4 (four) hours as needed. For wheezing      . aspirin EC 81 MG tablet Take 81 mg by mouth daily.      . budesonide-formoterol (SYMBICORT) 160-4.5 MCG/ACT inhaler Inhale 2 puffs into the  lungs 2 (two) times daily.      . clonazePAM (KLONOPIN) 0.5 MG tablet Take 0.5 mg by mouth 3 (three) times daily as needed. anxiety      . DULoxetine (CYMBALTA) 60 MG capsule Take 60 mg by mouth daily.       . metFORMIN (GLUCOPHAGE) 500 MG tablet Take 500 mg by mouth every morning.       . metoprolol succinate (TOPROL-XL) 50 MG 24 hr tablet Take 50 mg by mouth daily. Take with or immediately following a meal.      . nitroGLYCERIN (NITROSTAT) 0.4 MG SL tablet Place 1 tablet (0.4 mg total) under the tongue every 5 (five) minutes as needed. For chest pain  40 tablet  6  . omeprazole (PRILOSEC) 40 MG capsule Take 1 capsule (40 mg total) by mouth 2 (two) times daily.  60 capsule  11  . promethazine (PHENERGAN) 25 MG tablet Take 1/2 to 1 tab daily every 6 hours as neede for nausea.  30 tablet  1  . rosuvastatin (CRESTOR) 40 MG tablet Take 1 tablet (40 mg total) by mouth daily.  30 tablet  3  . triazolam (HALCION) 0.25 MG tablet Take 0.25 mg by mouth at bedtime as needed. For sleep        Allergies: Allergies  Allergen Reactions  . Trazodone Hcl Anaphylaxis  . Cefpodoxime Proxetil Other (See Comments)    unknown  . Erythromycin Ethylsuccinate Nausea And Vomiting  . Perphenazine Other (See Comments)    unknown    Social History:  The patient  reports that she quit smoking about 1 years ago. Her smoking use included Cigarettes. She quit after 40 years of use. She has never used smokeless tobacco. She reports that she does not drink alcohol or use illicit drugs.    PHYSICAL EXAM: VS:  BP 140/94  Pulse 60  Wt 201 lb (91.173 kg) Well nourished, well developed, in no acute distress HEENT: normal Neck: no JVD Cardiac:  normal S1, S2; RRR; no murmur Lungs:  clear to auscultation bilaterally, no wheezing, rhonchi or rales Abd: soft, nontender, no hepatomegaly Ext: no edema Skin: warm and dry Neuro:  CNs 2-12 intact, no focal abnormalities noted  EKG:  NSR, HR 60, normal axis, no acute changes       ASSESSMENT AND PLAN:  1. Hypertension:   Much better controlled.  I believe that it is in her best interest to have one provider managing her BP medications at this time.  I agree with Dr. Linda Hedges recent changes.  I would suggest she allow Dr.  Ameen to adjust medications further as necessary unless he needs cardiology input.  BP has been labile likely due to significant weight loss from GI issues while continuing to take several different antihypertensives.    2. Coronary Artery Disease:   No angina.  Continue ASA and statin.  3. Hyperlipidemia:   Recent lipid profile optimal.  Continue current Rx.  4. GERD:   Management per GI.  5. Disposition:   Follow up with Dr. Marca Ancona or me in 3 mos.  Signed, Tereso Newcomer, PA-C  1:31 PM 06/27/2012

## 2012-07-08 ENCOUNTER — Other Ambulatory Visit (INDEPENDENT_AMBULATORY_CARE_PROVIDER_SITE_OTHER): Payer: PRIVATE HEALTH INSURANCE

## 2012-07-08 DIAGNOSIS — I10 Essential (primary) hypertension: Secondary | ICD-10-CM

## 2012-07-08 LAB — BASIC METABOLIC PANEL
BUN: 10 mg/dL (ref 6–23)
Chloride: 100 mEq/L (ref 96–112)
GFR: 44.02 mL/min — ABNORMAL LOW (ref >60.00)
Glucose, Bld: 126 mg/dL — ABNORMAL HIGH (ref 70–99)
Potassium: 3 mEq/L — ABNORMAL LOW (ref 3.5–5.1)
Sodium: 139 mEq/L (ref 135–145)

## 2012-07-11 ENCOUNTER — Telehealth: Payer: Self-pay | Admitting: *Deleted

## 2012-07-11 DIAGNOSIS — I10 Essential (primary) hypertension: Secondary | ICD-10-CM

## 2012-07-11 MED ORDER — POTASSIUM CHLORIDE CRYS ER 20 MEQ PO TBCR: 40.0000 meq | EXTENDED_RELEASE_TABLET | Freq: Every day | ORAL | Status: DC

## 2012-07-11 NOTE — Telephone Encounter (Signed)
pt notified about lab results. states is not taking K+ or diuretics. instructed to take 40 meq K+ today only. repeat bmet 07/18/12, pt verablized understanding

## 2012-07-11 NOTE — Telephone Encounter (Signed)
Message copied by Tarri Fuller on Mon Jul 11, 2012  2:21 PM ------      Message from: [place], [place] T      Created: Mon Jul 11, 2012 10:03 AM       K+ low      I do not see any diuretics or K+ supplements on her med list      Please confirm what medications she is taking.      Have her take K+ 40 mEq po x 1      Check BMET in 1 week.      Tereso Newcomer, PA-C  10:03 AM 07/11/2012

## 2012-07-14 ENCOUNTER — Telehealth: Payer: Self-pay | Admitting: Internal Medicine

## 2012-07-14 NOTE — Telephone Encounter (Signed)
Pt states she is still having problems with watery diarrhea, reports about 10 stools/day. Pt scheduled to see Doug Sou PA tomorrow at 3pm. Pt aware of appt date and time.

## 2012-07-15 ENCOUNTER — Other Ambulatory Visit (INDEPENDENT_AMBULATORY_CARE_PROVIDER_SITE_OTHER): Payer: PRIVATE HEALTH INSURANCE

## 2012-07-15 ENCOUNTER — Encounter: Payer: Self-pay | Admitting: Gastroenterology

## 2012-07-15 ENCOUNTER — Ambulatory Visit (INDEPENDENT_AMBULATORY_CARE_PROVIDER_SITE_OTHER): Payer: PRIVATE HEALTH INSURANCE | Admitting: Gastroenterology

## 2012-07-15 VITALS — BP 170/110 | HR 100 | Ht 65.0 in | Wt 201.0 lb

## 2012-07-15 DIAGNOSIS — R197 Diarrhea, unspecified: Secondary | ICD-10-CM

## 2012-07-15 LAB — CBC WITH DIFFERENTIAL/PLATELET
Basophils Absolute: 0 10*3/uL (ref 0.0–0.1)
Lymphocytes Relative: 18.2 % (ref 12.0–46.0)
Lymphs Abs: 1.6 10*3/uL (ref 0.7–4.0)
Monocytes Relative: 3.8 % (ref 3.0–12.0)
Platelets: 233 10*3/uL (ref 150.0–400.0)
RDW: 13.7 % (ref 11.5–14.6)

## 2012-07-15 LAB — COMPREHENSIVE METABOLIC PANEL
ALT: 13 U/L (ref 0–35)
CO2: 32 mEq/L (ref 19–32)
Chloride: 103 mEq/L (ref 96–112)
GFR: 50.09 mL/min — ABNORMAL LOW (ref >60.00)
Sodium: 142 mEq/L (ref 135–145)
Total Bilirubin: 0.4 mg/dL (ref 0.3–1.2)
Total Protein: 6.4 g/dL (ref 6.0–8.3)

## 2012-07-15 MED ORDER — CHOLESTYRAMINE 4 G PO PACK: 1.0000 | PACK | Freq: Two times a day (BID) | ORAL | Status: DC

## 2012-07-15 NOTE — Progress Notes (Addendum)
07/15/2012 [name] [id number] 10/14/1955   History of Present Illness: Patient is a 56 year old female who presents to our office today with complaints of diarrhea for the past 5 or 6 days. She's been having approximately 10-12 watery bowel movements daily. She denies any nausea or vomiting. There is no blood in her stool. She has some mild lower abdominal cramping that occurs with the need to move her bowels, however no other significant abdominal pains. She denies any recent antibiotics or new medications except for PPI that was started at the end of October. She denies any recent travel. States that her husband with sick earlier this week as well with headache and vomiting, but no diarrhea. She cannot identify any other triggers. She has not taken anything for her symptoms. She had a colonoscopy in July 2012, which was a poor prep. Barium enema showed no abnormalities and it was recommended that she have repeat colonoscopy in 5 years from that time.  She reports that overall she has been sick and in bed for the last 2 months because of her severe esophagitis that was causing problems before the diarrhea began. Says that she only gets out of bed to use the restroom and to do essential daily things. She does have a history of major depression and bipolar disorder.  Current Medications, Allergies, Past Medical History, Past Surgical History, Family History and Social History were reviewed in [employer] record.   Physical Exam: BP 170/110  Pulse 100  Ht 5\' 5"  (1.651 m)  Wt 201 lb (91.173 kg)  BMI 33.45 kg/m2 General: Well developed , white female in no acute distress; appears fatigued. Skin:  Good capillary refill and no skin tenting. Head: Normocephalic and atraumatic Eyes:  sclerae anicteric, conjunctiva pink  Ears: Normal auditory acuity Mouth:  MMM. Lungs: Clear throughout to auscultation Heart: Regular rate and rhythm Abdomen: Soft, non-distended.   BS present.  Mild lower abdominal TTP without R/R/G. Musculoskeletal: Symmetrical with no gross deformities  Neurological: Alert oriented x 4, grossly nonfocal Psychological:  Alert and cooperative.  Depressed mood and affect  Assessment and Recommendations: -Diarrhea:  This is acute in onset and suspect infectious source. We will check stool for C. difficile, culture for enteric pathogens, and ova and parasites. We'll also check a CBC and CMP; according to her records, her potassium does fluctuate quite frequently. We will start her on Questran beginning with one packet daily and increasing to 2 packets daily if needed. She was told to take in a lot of liquids and to follow a bland/BRAT diet. If this is not infectious and diarrhea continues she may need a flexible sigmoidoscopy. I have questioned her if she felt that she needed to go to the hospital and she declined, however, if she does worsen she agreed that she would go to the emergency department.   *Her blood pressure was quite elevated at her appointment today. I did ask her to contact her PCP about this.  Addendum: Reviewed and agree with initial management. Beverley Fiedler, MD

## 2012-07-15 NOTE — Patient Instructions (Addendum)
Go to the basement for labs today We have sent in a prescription to your pharmacy If you get worse over the weekend go to the ER Contact your PCP about your elevated blood pressure

## 2012-07-16 ENCOUNTER — Emergency Department (HOSPITAL_COMMUNITY): Payer: PRIVATE HEALTH INSURANCE

## 2012-07-16 ENCOUNTER — Encounter (HOSPITAL_COMMUNITY): Payer: Self-pay | Admitting: Family Medicine

## 2012-07-16 ENCOUNTER — Telehealth: Payer: Self-pay | Admitting: Physician Assistant

## 2012-07-16 ENCOUNTER — Emergency Department (HOSPITAL_COMMUNITY)
Admission: EM | Admit: 2012-07-16 | Discharge: 2012-07-17 | Disposition: A | Discharge status: Home / Self Care | Payer: PRIVATE HEALTH INSURANCE | Attending: Emergency Medicine | Admitting: Emergency Medicine

## 2012-07-16 DIAGNOSIS — Z79899 Other long term (current) drug therapy: Secondary | ICD-10-CM | POA: Insufficient documentation

## 2012-07-16 DIAGNOSIS — E785 Hyperlipidemia, unspecified: Secondary | ICD-10-CM | POA: Insufficient documentation

## 2012-07-16 DIAGNOSIS — K219 Gastro-esophageal reflux disease without esophagitis: Secondary | ICD-10-CM | POA: Insufficient documentation

## 2012-07-16 DIAGNOSIS — H539 Unspecified visual disturbance: Secondary | ICD-10-CM | POA: Insufficient documentation

## 2012-07-16 DIAGNOSIS — Z862 Personal history of diseases of the blood and blood-forming organs and certain disorders involving the immune mechanism: Secondary | ICD-10-CM | POA: Insufficient documentation

## 2012-07-16 DIAGNOSIS — Z9861 Coronary angioplasty status: Secondary | ICD-10-CM | POA: Insufficient documentation

## 2012-07-16 DIAGNOSIS — E119 Type 2 diabetes mellitus without complications: Secondary | ICD-10-CM | POA: Insufficient documentation

## 2012-07-16 DIAGNOSIS — Z87891 Personal history of nicotine dependence: Secondary | ICD-10-CM | POA: Insufficient documentation

## 2012-07-16 DIAGNOSIS — F329 Major depressive disorder, single episode, unspecified: Secondary | ICD-10-CM | POA: Insufficient documentation

## 2012-07-16 DIAGNOSIS — F1911 Other psychoactive substance abuse, in remission: Secondary | ICD-10-CM | POA: Insufficient documentation

## 2012-07-16 DIAGNOSIS — I10 Essential (primary) hypertension: Secondary | ICD-10-CM | POA: Insufficient documentation

## 2012-07-16 DIAGNOSIS — Z8659 Personal history of other mental and behavioral disorders: Secondary | ICD-10-CM | POA: Insufficient documentation

## 2012-07-16 DIAGNOSIS — F319 Bipolar disorder, unspecified: Secondary | ICD-10-CM | POA: Insufficient documentation

## 2012-07-16 DIAGNOSIS — J449 Chronic obstructive pulmonary disease, unspecified: Secondary | ICD-10-CM | POA: Insufficient documentation

## 2012-07-16 DIAGNOSIS — Z8719 Personal history of other diseases of the digestive system: Secondary | ICD-10-CM | POA: Insufficient documentation

## 2012-07-16 DIAGNOSIS — I2510 Atherosclerotic heart disease of native coronary artery without angina pectoris: Secondary | ICD-10-CM | POA: Insufficient documentation

## 2012-07-16 DIAGNOSIS — J4489 Other specified chronic obstructive pulmonary disease: Secondary | ICD-10-CM | POA: Insufficient documentation

## 2012-07-16 DIAGNOSIS — Z9889 Other specified postprocedural states: Secondary | ICD-10-CM | POA: Insufficient documentation

## 2012-07-16 DIAGNOSIS — R51 Headache: Secondary | ICD-10-CM | POA: Insufficient documentation

## 2012-07-16 DIAGNOSIS — Z87448 Personal history of other diseases of urinary system: Secondary | ICD-10-CM | POA: Insufficient documentation

## 2012-07-16 DIAGNOSIS — Z7982 Long term (current) use of aspirin: Secondary | ICD-10-CM | POA: Insufficient documentation

## 2012-07-16 DIAGNOSIS — Z8679 Personal history of other diseases of the circulatory system: Secondary | ICD-10-CM | POA: Insufficient documentation

## 2012-07-16 LAB — CBC
HCT: 41.7 % (ref 36.0–46.0)
Hemoglobin: 13.5 g/dL (ref 12.0–15.0)
MCH: 32.1 pg (ref 26.0–34.0)
MCHC: 32.4 g/dL (ref 30.0–36.0)
MCV: 99 fL (ref 78.0–100.0)
Platelets: 221 10*3/uL (ref 150–400)
RBC: 4.21 MIL/uL (ref 3.87–5.11)
RDW: 13.2 % (ref 11.5–15.5)
WBC: 7.5 10*3/uL (ref 4.0–10.5)

## 2012-07-16 LAB — BASIC METABOLIC PANEL
CO2: 29 mEq/L (ref 19–32)
Chloride: 102 mEq/L (ref 96–112)
GFR calc non Af Amer: 55 mL/min — ABNORMAL LOW (ref >90)
Glucose, Bld: 89 mg/dL (ref 70–99)
Potassium: 4.7 mEq/L (ref 3.5–5.1)
Sodium: 141 mEq/L (ref 135–145)

## 2012-07-16 MED ORDER — KETOROLAC TROMETHAMINE 30 MG/ML IJ SOLN
30.0000 mg | Freq: Once | INTRAMUSCULAR | Status: AC
  Administered 2012-07-16: 30 mg via INTRAVENOUS
  Filled 2012-07-16: qty 1

## 2012-07-16 MED ORDER — HYDRALAZINE HCL 20 MG/ML IJ SOLN
10.0000 mg | Freq: Once | INTRAMUSCULAR | Status: AC
  Administered 2012-07-16: 10 mg via INTRAVENOUS
  Filled 2012-07-16: qty 1

## 2012-07-16 MED ORDER — METOCLOPRAMIDE HCL 5 MG/ML IJ SOLN: 10.0000 mg | Freq: Once | INTRAMUSCULAR | Status: DC

## 2012-07-16 MED ORDER — METOCLOPRAMIDE HCL 5 MG/ML IJ SOLN
10.0000 mg | Freq: Once | INTRAMUSCULAR | Status: AC
  Administered 2012-07-16: 10 mg via INTRAVENOUS
  Filled 2012-07-16: qty 2

## 2012-07-16 MED ORDER — DIPHENHYDRAMINE HCL 50 MG/ML IJ SOLN
25.0000 mg | Freq: Once | INTRAMUSCULAR | Status: AC
  Administered 2012-07-16: 25 mg via INTRAVENOUS
  Filled 2012-07-16: qty 1

## 2012-07-16 NOTE — ED Notes (Signed)
IV team paged and will be down at the bedside shortly

## 2012-07-16 NOTE — Telephone Encounter (Signed)
Patient called the answering service re: BP 193/125. Reading her prior office notes, there seems to have been various antihypertensive adjustments. There is confusion on the patient's part as to whether she should be taking certain medications. She tells me that she is no longer taking any antihypertensives. Prior office note on 12/2 indicates BP had been well-controlled on BB which was continued. She states that she has a severe headache and is now having trouble concentrating. She does technically meet criteria for hypertensive urgency. She lives with her husband and son. Advised for her to take her Toprol-XL. Concern is that Toprol-XL will not adequately reduce her blood pressure. She is currently symptomatic, and goal is to avoid progression of symptoms (encephalopathy, CVA). I have asked her son to have either he or his father drive her to the nearest ED for formal evaluation and BP control if need be. He understood and agreed.    Jacqulyn Bath, PA-C 07/16/2012 4:52 PM

## 2012-07-16 NOTE — ED Notes (Signed)
Monitors applied

## 2012-07-16 NOTE — ED Notes (Signed)
Patient transported to CT 

## 2012-07-16 NOTE — ED Provider Notes (Signed)
History     CSN: [id number]  Arrival date & time 07/16/12  1732   First MD Initiated Contact with Patient 07/16/12 1955      Chief Complaint  Patient presents with  . Hypertension    (Consider location/radiation/quality/duration/timing/severity/associated sxs/prior treatment) Patient is a 56 y.o. female presenting with general illness. The history is provided by the patient. No language interpreter was used.  Illness  The current episode started 3 to 5 days ago. The problem occurs continuously. The problem has been unchanged. The problem is moderate. Nothing relieves the symptoms. Nothing aggravates the symptoms. Associated symptoms include headaches. Pertinent negatives include no fever, no abdominal pain, no diarrhea, no nausea, no vomiting, no congestion, no sore throat, no cough and no rash.   No current facility-administered medications on file prior to encounter.   Current Outpatient Prescriptions on File Prior to Encounter  Medication Sig Dispense Refill  . albuterol (PROVENTIL HFA;VENTOLIN HFA) 108 (90 BASE) MCG/ACT inhaler Inhale 2 puffs into the lungs every 4 (four) hours as needed. For wheezing      . aspirin EC 81 MG tablet Take 81 mg by mouth daily.      . clonazePAM (KLONOPIN) 0.5 MG tablet Take 0.5 mg by mouth 3 (three) times daily. anxiety      . DULoxetine (CYMBALTA) 60 MG capsule Take 60 mg by mouth daily.       . metFORMIN (GLUCOPHAGE) 500 MG tablet Take 500 mg by mouth every morning.       . nitroGLYCERIN (NITROSTAT) 0.4 MG SL tablet Place 1 tablet (0.4 mg total) under the tongue every 5 (five) minutes as needed. For chest pain  40 tablet  6  . rosuvastatin (CRESTOR) 40 MG tablet Take 1 tablet (40 mg total) by mouth daily.  30 tablet  3  . triazolam (HALCION) 0.25 MG tablet Take 0.25 mg by mouth at bedtime. For sleep       Allergies  Allergen Reactions  . Trazodone Hcl Anaphylaxis  . Cefpodoxime Proxetil Other (See Comments)    unknown  . Erythromycin  Ethylsuccinate Nausea And Vomiting  . Perphenazine Other (See Comments)    unknown    Past Medical History  Diagnosis Date  . Anemia, unspecified   . Major depression   . Narcotic abuse in remission   . Bipolar disorder, unspecified   . Dissociative identity disorder   . HTN (hypertension), benign     a. meds d/c'd by PCP 11/13 due to low BP in setting of significant weight loss and poor appetite related to esophagitis (Toprol restarted late Nov 2013)  . HLD (hyperlipidemia)   . GERD (gastroesophageal reflux disease)   . CAD (coronary artery disease)     a. Cypher DES to mLAD in 2007; b. MV 3/09: EF 72%, no ischemia/infarction.; c. MV (12/11) EF 73%, ? Ant soft tissue atten, no ischemia/infarction; d. LHC (1/12): patent LAD stent, mild LIs only; e. Lex MV (6/13): no ischemia or infarction.   Marland Kitchen DM2 (diabetes mellitus, type 2)   . Hiatal hernia   . COPD (chronic obstructive pulmonary disease)     a. quit smoking 06/2010; b. PFTs (4/10):  FEV1 66%, ratio 64%, DLCO 67%  . Palpitations     a. Holter (12/11) with occasional PVCs. Holter (11/12): rare PACs and PVCs.  Marland Kitchen Hx of echocardiogram     a. Echo (1/08): EF 65%, no significant valvular abnormalities.; b. Echo (12/11): EF 55-60%, grade II diast dysfn, normal wall motion, mild LAE.;  c. Echo (5/13) with EF 55-60%, mild LVH.   Marland Kitchen Esophageal stricture     a. s/p dilatation 7/12;  b. EGD 04/2012 with severe esophagitis and stricture (PPI started and dilatation deferred for several weeks)  . H/O partial nephrectomy 1997  . H/O orthostatic hypotension     probably related to psych meds    Past Surgical History  Procedure Date  . Cesarean section   . Bladder surgery     Tack  . Nephrectomy     Part of left kidney  . Tonsillectomy and adenoidectomy   . Carpal tunnel release     BILATERAL HANDS  . Coronary stent placement 2007    Family History  Problem Relation Age of Onset  . Emphysema Mother   . Ovarian cancer Mother   . Heart  disease Father     mi age 62  . Heart attack Sister 30  . Heart attack Brother 30  . Colon cancer Neg Hx     History  Substance Use Topics  . Smoking status: Former Smoker -- 40 years    Types: Cigarettes    Quit date: 07/07/2010  . Smokeless tobacco: Never Used  . Alcohol Use: No    OB History    Grav Para Term Preterm Abortions TAB SAB Ect Mult Living                  Review of Systems  Constitutional: Negative for fever and chills.  HENT: Negative for congestion and sore throat.   Eyes: Positive for visual disturbance.  Respiratory: Negative for cough and shortness of breath.   Cardiovascular: Negative for chest pain and palpitations.  Gastrointestinal: Negative for nausea, vomiting, abdominal pain and diarrhea.  Genitourinary: Negative for dysuria and urgency.  Musculoskeletal: Negative for arthralgias and gait problem.  Skin: Negative for rash and wound.  Neurological: Positive for headaches. Negative for dizziness and syncope.  Psychiatric/Behavioral: Negative for confusion and agitation.  All other systems reviewed and are negative.    Allergies  Trazodone hcl; Cefpodoxime proxetil; Erythromycin ethylsuccinate; and Perphenazine  Home Medications   Current Outpatient Rx  Name  Route  Sig  Dispense  Refill  . ALBUTEROL SULFATE HFA 108 (90 BASE) MCG/ACT IN AERS   Inhalation   Inhale 2 puffs into the lungs every 4 (four) hours as needed. For wheezing         . ASPIRIN EC 81 MG PO TBEC   Oral   Take 81 mg by mouth daily.         Marland Kitchen CLONAZEPAM 0.5 MG PO TABS   Oral   Take 0.5 mg by mouth 3 (three) times daily. anxiety         . DULOXETINE HCL 60 MG PO CPEP   Oral   Take 60 mg by mouth daily.          . IMODIUM PO   Oral   Take 1 tablet by mouth as needed. For diarrhea         . METFORMIN HCL 500 MG PO TABS   Oral   Take 500 mg by mouth every morning.          Marland Kitchen NITROGLYCERIN 0.4 MG SL SUBL   Sublingual   Place 1 tablet (0.4 mg total)  under the tongue every 5 (five) minutes as needed. For chest pain   40 tablet   6   . OMEPRAZOLE 40 MG PO CPDR   Oral   Take 80 mg  by mouth daily.         Frazier Butt OP   Both Eyes   Place 1 drop into both eyes 2 (two) times daily as needed. For dry eyes         . PROMETHAZINE HCL 25 MG PO TABS   Oral   Take 12.5-25 mg by mouth every 6 (six) hours as needed. For nausea         . ROSUVASTATIN CALCIUM 40 MG PO TABS   Oral   Take 1 tablet (40 mg total) by mouth daily.   30 tablet   3   . TRIAZOLAM 0.25 MG PO TABS   Oral   Take 0.25 mg by mouth at bedtime. For sleep           BP 158/97  Pulse 85  Temp 98.7 F (37.1 C)  Resp 18  SpO2 96%  Physical Exam  Constitutional: She is oriented to person, place, and time. She appears well-developed.  Non-toxic appearance.  HENT:  Head: Normocephalic and atraumatic.  Eyes:  Fundoscopic exam:      The right eye shows no hemorrhage and no papilledema.       The left eye shows no hemorrhage and no papilledema.  Neck: Normal range of motion. Neck supple.  Cardiovascular: Normal rate and regular rhythm.   Pulmonary/Chest: Effort normal and breath sounds normal. She has no decreased breath sounds.  Abdominal: Normal appearance and bowel sounds are normal.  Musculoskeletal:       Right lower leg: She exhibits no swelling and no edema.       Left lower leg: She exhibits no swelling and no edema.  Neurological: She is alert and oriented to person, place, and time. She has normal strength and normal reflexes. No cranial nerve deficit or sensory deficit. Coordination normal. GCS eye subscore is 4. GCS verbal subscore is 5. GCS motor subscore is 6.  Skin: Skin is warm and dry.  Psychiatric: She has a normal mood and affect. Her speech is normal.    ED Course  Procedures (including critical care time) CT Head Wo Contrast (Final result)   Result time:07/16/12 2213    Final result by Rad Results In Interface (07/16/12 22:13:25)     Narrative:   *RADIOLOGY REPORT*  Clinical Data: Hypertension and blurred vision.  CT HEAD WITHOUT CONTRAST  Technique: Contiguous axial images were obtained from the base of the skull through the vertex without contrast.  Comparison: 05/16/2012  Findings: No evidence for acute hemorrhage, mass lesion, midline shift, hydrocephalus or large infarct. Normal appearance of the ventricles and basal cisterns. There is concern for exophthalmos which may be chronic. No acute bony abnormality.  IMPRESSION: No acute intracranial abnormality.  Possible exophthalmos and recommend clinical correlation.   Original Report Authenticated By: Richarda Overlie, M.D.        Results for orders placed during the hospital encounter of 07/16/12  BASIC METABOLIC PANEL      Component Value Range   Sodium 141  135 - 145 mEq/L   Potassium 4.7  3.5 - 5.1 mEq/L   Chloride 102  96 - 112 mEq/L   CO2 29  19 - 32 mEq/L   Glucose, Bld 89  70 - 99 mg/dL   BUN 10  6 - 23 mg/dL   Creatinine, Ser 2.13  0.50 - 1.10 mg/dL   Calcium 9.5  8.4 - 08.6 mg/dL   GFR calc non Af Amer 55 (*) >90 mL/min   GFR  calc Af Amer 64 (*) >90 mL/min  CBC      Component Value Range   WBC 7.5  4.0 - 10.5 K/uL   RBC 4.21  3.87 - 5.11 MIL/uL   Hemoglobin 13.5  12.0 - 15.0 g/dL   HCT 16.1  09.6 - 04.5 %   MCV 99.0  78.0 - 100.0 fL   MCH 32.1  26.0 - 34.0 pg   MCHC 32.4  30.0 - 36.0 g/dL   RDW 40.9  81.1 - 91.4 %   Platelets 221  150 - 400 K/uL    Labs Reviewed - No data to display No results found.   No diagnosis found.    MDM  Pt w/ remote hx of HTN - has been off of meds as instructed by pcp. States HA x 3 days, global, aching, no sudden onset, max at onset or worst of life. No prior hx of HAs, admits to photophobia, mild blurred vision, intermittent LUE/LLE paresthesia. Denies chest pain or urine changes. Was seen by GI for known esophagitis and noted to have BP of 190/120 - called cardiology and they instructed her to  take metoprolol 50mg  and come to ED. On arrival bp 174/108, HR 101. fundo exam - cup disc sharp, no hemorrhage, CN II-XII intact, 5/5 strength in all ext, normal sensation and cerebellar function.   New onset HTN HA - will check CT head, ECG and BMP, CBC. Will give reglan, benadryl and toradol for HA. Will refrain from acutely lowering BP in ED in light of benign neuro exam. Doubt HTN emergency, CVA/TIA, SAH at this time.   Reassessed, bp now 206/107 - given 10mg  hydralazine. Cr normal, ECG - no acute ischemia, CT head - NAICA. pts HA resolved. BP now 148/79. Stable for d/c home. Instructed to re-initiate home dose of metoprolol and follow up w/ pcp on Monday. Given return precautions. D/c in good and stable condition.   1. Headache   2. Hypertension    New Prescriptions   No medications on file   Garth Schlatter, MD [facility] [address] [place] [place] [address] [phone]  Schedule an appointment as soon as possible for a visit on 07/18/2012  [facility] EMERGENCY DEPARTMENT [address] [id number] [place] [address] [phone]           Audelia Hives, MD 07/17/12 0347  Audelia Hives, MD 07/17/12 [phone]

## 2012-07-16 NOTE — ED Notes (Signed)
Per pt sts hypertension since yesterday. sts the highest was 193/124. Was told to take a metoprolol and come here per her cardiologist. sts HA that started today.

## 2012-07-17 NOTE — ED Provider Notes (Signed)
I saw and evaluated the patient, reviewed the resident's note and I agree with the findings and plan.   Jarious Lyon, MD 07/17/12 1541 

## 2012-07-18 ENCOUNTER — Other Ambulatory Visit: Payer: PRIVATE HEALTH INSURANCE

## 2012-07-18 NOTE — Telephone Encounter (Signed)
Agree. Reviewed ED notes.  She has had wide fluctuations in BP with multiple med adjustments in th past.  When I last saw [name], I suggested she have Ivory Broad, Joylene John, MD manage her HTN meds unless cardiology was needed to manage.  EDP set her up to see Garth Schlatter, MD today. Tereso Newcomer, PA-C  2:17 PM 07/18/2012

## 2012-07-22 ENCOUNTER — Other Ambulatory Visit: Payer: PRIVATE HEALTH INSURANCE

## 2012-07-25 ENCOUNTER — Other Ambulatory Visit: Payer: PRIVATE HEALTH INSURANCE

## 2012-08-01 ENCOUNTER — Telehealth: Payer: Self-pay | Admitting: Internal Medicine

## 2012-08-01 NOTE — Telephone Encounter (Signed)
Pt had an EGD scheduled for tomorrow, she cancelled this appt. Pt states she has pneumonia and she wants to know how long she should wait to reschedule. Instructed pt to wait until she has completed her antibiotics to make sure she is over the pneumonia, then we can reschedule the procedure.

## 2012-08-02 ENCOUNTER — Other Ambulatory Visit: Payer: PRIVATE HEALTH INSURANCE

## 2012-08-02 ENCOUNTER — Encounter: Payer: PRIVATE HEALTH INSURANCE | Admitting: Internal Medicine

## 2012-08-03 ENCOUNTER — Emergency Department (HOSPITAL_COMMUNITY): Payer: No Typology Code available for payment source

## 2012-08-03 ENCOUNTER — Other Ambulatory Visit: Payer: Self-pay

## 2012-08-03 ENCOUNTER — Encounter (HOSPITAL_COMMUNITY): Payer: Self-pay | Admitting: Family Medicine

## 2012-08-03 ENCOUNTER — Inpatient Hospital Stay (HOSPITAL_COMMUNITY)
Admission: EM | Admit: 2012-08-03 | Discharge: 2012-08-06 | DRG: 190 | Disposition: A | Discharge status: Home / Self Care | Payer: No Typology Code available for payment source | Attending: Internal Medicine | Admitting: Internal Medicine

## 2012-08-03 DIAGNOSIS — I129 Hypertensive chronic kidney disease with stage 1 through stage 4 chronic kidney disease, or unspecified chronic kidney disease: Secondary | ICD-10-CM | POA: Diagnosis present

## 2012-08-03 DIAGNOSIS — F411 Generalized anxiety disorder: Secondary | ICD-10-CM | POA: Diagnosis present

## 2012-08-03 DIAGNOSIS — Z7982 Long term (current) use of aspirin: Secondary | ICD-10-CM

## 2012-08-03 DIAGNOSIS — F319 Bipolar disorder, unspecified: Secondary | ICD-10-CM | POA: Diagnosis present

## 2012-08-03 DIAGNOSIS — Z9861 Coronary angioplasty status: Secondary | ICD-10-CM

## 2012-08-03 DIAGNOSIS — I2510 Atherosclerotic heart disease of native coronary artery without angina pectoris: Secondary | ICD-10-CM | POA: Diagnosis present

## 2012-08-03 DIAGNOSIS — F4481 Dissociative identity disorder: Secondary | ICD-10-CM | POA: Diagnosis present

## 2012-08-03 DIAGNOSIS — J441 Chronic obstructive pulmonary disease with (acute) exacerbation: Principal | ICD-10-CM | POA: Diagnosis present

## 2012-08-03 DIAGNOSIS — Z888 Allergy status to other drugs, medicaments and biological substances status: Secondary | ICD-10-CM

## 2012-08-03 DIAGNOSIS — G4733 Obstructive sleep apnea (adult) (pediatric): Secondary | ICD-10-CM | POA: Diagnosis present

## 2012-08-03 DIAGNOSIS — N179 Acute kidney failure, unspecified: Secondary | ICD-10-CM | POA: Diagnosis present

## 2012-08-03 DIAGNOSIS — E785 Hyperlipidemia, unspecified: Secondary | ICD-10-CM | POA: Diagnosis present

## 2012-08-03 DIAGNOSIS — N183 Chronic kidney disease, stage 3 unspecified: Secondary | ICD-10-CM | POA: Diagnosis present

## 2012-08-03 DIAGNOSIS — K219 Gastro-esophageal reflux disease without esophagitis: Secondary | ICD-10-CM | POA: Diagnosis present

## 2012-08-03 DIAGNOSIS — Z905 Acquired absence of kidney: Secondary | ICD-10-CM

## 2012-08-03 DIAGNOSIS — F329 Major depressive disorder, single episode, unspecified: Secondary | ICD-10-CM | POA: Diagnosis present

## 2012-08-03 DIAGNOSIS — F3130 Bipolar disorder, current episode depressed, mild or moderate severity, unspecified: Secondary | ICD-10-CM | POA: Diagnosis present

## 2012-08-03 DIAGNOSIS — R630 Anorexia: Secondary | ICD-10-CM | POA: Diagnosis present

## 2012-08-03 DIAGNOSIS — E119 Type 2 diabetes mellitus without complications: Secondary | ICD-10-CM | POA: Diagnosis present

## 2012-08-03 DIAGNOSIS — F17200 Nicotine dependence, unspecified, uncomplicated: Secondary | ICD-10-CM

## 2012-08-03 DIAGNOSIS — Z87891 Personal history of nicotine dependence: Secondary | ICD-10-CM

## 2012-08-03 DIAGNOSIS — R0902 Hypoxemia: Secondary | ICD-10-CM

## 2012-08-03 DIAGNOSIS — I10 Essential (primary) hypertension: Secondary | ICD-10-CM | POA: Diagnosis present

## 2012-08-03 DIAGNOSIS — J9600 Acute respiratory failure, unspecified whether with hypoxia or hypercapnia: Secondary | ICD-10-CM | POA: Diagnosis present

## 2012-08-03 DIAGNOSIS — J449 Chronic obstructive pulmonary disease, unspecified: Secondary | ICD-10-CM

## 2012-08-03 DIAGNOSIS — Z9089 Acquired absence of other organs: Secondary | ICD-10-CM

## 2012-08-03 DIAGNOSIS — R197 Diarrhea, unspecified: Secondary | ICD-10-CM | POA: Diagnosis present

## 2012-08-03 DIAGNOSIS — E876 Hypokalemia: Secondary | ICD-10-CM | POA: Diagnosis present

## 2012-08-03 DIAGNOSIS — Z79899 Other long term (current) drug therapy: Secondary | ICD-10-CM

## 2012-08-03 DIAGNOSIS — Z8249 Family history of ischemic heart disease and other diseases of the circulatory system: Secondary | ICD-10-CM

## 2012-08-03 LAB — CBC WITH DIFFERENTIAL/PLATELET
Eosinophils Relative: 0 % (ref 0–5)
HCT: 45 % (ref 36.0–46.0)
Lymphocytes Relative: 14 % (ref 12–46)
Lymphs Abs: 2.6 10*3/uL (ref 0.7–4.0)
MCV: 97 fL (ref 78.0–100.0)
Monocytes Absolute: 1.8 10*3/uL — ABNORMAL HIGH (ref 0.1–1.0)
Monocytes Relative: 10 % (ref 3–12)
RDW: 13.7 % (ref 11.5–15.5)
WBC: 18.9 10*3/uL — ABNORMAL HIGH (ref 4.0–10.5)

## 2012-08-03 LAB — BASIC METABOLIC PANEL
CO2: 31 mEq/L (ref 19–32)
Chloride: 89 mEq/L — ABNORMAL LOW (ref 96–112)
Creatinine, Ser: 2.32 mg/dL — ABNORMAL HIGH (ref 0.50–1.10)

## 2012-08-03 LAB — HEPATIC FUNCTION PANEL
ALT: 7 U/L (ref 0–35)
Bilirubin, Direct: 0.2 mg/dL (ref 0.0–0.3)
Indirect Bilirubin: 0.3 mg/dL (ref 0.3–0.9)
Total Bilirubin: 0.5 mg/dL (ref 0.3–1.2)

## 2012-08-03 LAB — OCCULT BLOOD, POC DEVICE: Fecal Occult Bld: NEGATIVE

## 2012-08-03 MED ORDER — ALBUTEROL SULFATE (5 MG/ML) 0.5% IN NEBU
5.0000 mg | INHALATION_SOLUTION | Freq: Once | RESPIRATORY_TRACT | Status: AC
  Administered 2012-08-03: 5 mg via RESPIRATORY_TRACT
  Filled 2012-08-03: qty 1

## 2012-08-03 MED ORDER — CLONAZEPAM 0.5 MG PO TABS
0.5000 mg | ORAL_TABLET | Freq: Three times a day (TID) | ORAL | Status: DC
  Administered 2012-08-03 – 2012-08-06 (×8): 0.5 mg via ORAL
  Filled 2012-08-03 (×8): qty 1

## 2012-08-03 MED ORDER — METOPROLOL SUCCINATE ER 50 MG PO TB24
50.0000 mg | ORAL_TABLET | Freq: Every day | ORAL | Status: DC
  Administered 2012-08-04 – 2012-08-06 (×3): 50 mg via ORAL
  Filled 2012-08-03 (×3): qty 1

## 2012-08-03 MED ORDER — IPRATROPIUM BROMIDE 0.02 % IN SOLN
0.5000 mg | RESPIRATORY_TRACT | Status: DC
  Administered 2012-08-04 (×4): 0.5 mg via RESPIRATORY_TRACT
  Filled 2012-08-03 (×5): qty 2.5

## 2012-08-03 MED ORDER — ALBUTEROL SULFATE (5 MG/ML) 0.5% IN NEBU
5.0000 mg | INHALATION_SOLUTION | Freq: Once | RESPIRATORY_TRACT | Status: AC
  Administered 2012-08-03: 5 mg via RESPIRATORY_TRACT
  Filled 2012-08-03 (×2): qty 0.5

## 2012-08-03 MED ORDER — ATORVASTATIN CALCIUM 80 MG PO TABS
80.0000 mg | ORAL_TABLET | Freq: Every day | ORAL | Status: DC
  Administered 2012-08-04 – 2012-08-05 (×2): 80 mg via ORAL
  Filled 2012-08-03 (×3): qty 1

## 2012-08-03 MED ORDER — SODIUM CHLORIDE 0.9 % IV SOLN
INTRAVENOUS | Status: DC
  Administered 2012-08-03 – 2012-08-05 (×5): via INTRAVENOUS
  Administered 2012-08-06: 125 mL via INTRAVENOUS

## 2012-08-03 MED ORDER — MOMETASONE FURO-FORMOTEROL FUM 200-5 MCG/ACT IN AERO
2.0000 | INHALATION_SPRAY | Freq: Two times a day (BID) | RESPIRATORY_TRACT | Status: DC
  Administered 2012-08-04 – 2012-08-06 (×4): 2 via RESPIRATORY_TRACT
  Filled 2012-08-03 (×2): qty 8.8

## 2012-08-03 MED ORDER — SODIUM CHLORIDE 0.9 % IV BOLUS (SEPSIS)
500.0000 mL | Freq: Once | INTRAVENOUS | Status: AC
  Administered 2012-08-03: 1000 mL via INTRAVENOUS

## 2012-08-03 MED ORDER — PREDNISONE 50 MG PO TABS
60.0000 mg | ORAL_TABLET | Freq: Every day | ORAL | Status: DC
  Administered 2012-08-04 – 2012-08-06 (×3): 60 mg via ORAL
  Filled 2012-08-03 (×5): qty 1

## 2012-08-03 MED ORDER — DULOXETINE HCL 60 MG PO CPEP
60.0000 mg | ORAL_CAPSULE | Freq: Every day | ORAL | Status: DC
  Administered 2012-08-04 – 2012-08-06 (×3): 60 mg via ORAL
  Filled 2012-08-03 (×3): qty 1

## 2012-08-03 MED ORDER — TRIAZOLAM 0.25 MG PO TABS
0.2500 mg | ORAL_TABLET | Freq: Every day | ORAL | Status: DC
  Administered 2012-08-03 – 2012-08-05 (×3): 0.25 mg via ORAL
  Filled 2012-08-03 (×3): qty 1

## 2012-08-03 MED ORDER — PREDNISONE 20 MG PO TABS
60.0000 mg | ORAL_TABLET | Freq: Once | ORAL | Status: AC
  Administered 2012-08-03: 60 mg via ORAL
  Filled 2012-08-03: qty 3

## 2012-08-03 MED ORDER — DEXTROSE 5 % IV SOLN
1.0000 g | INTRAVENOUS | Status: DC
  Administered 2012-08-03 – 2012-08-05 (×3): 1 g via INTRAVENOUS
  Filled 2012-08-03 (×4): qty 10

## 2012-08-03 MED ORDER — ACETAMINOPHEN 650 MG RE SUPP: 650.0000 mg | Freq: Four times a day (QID) | RECTAL | Status: DC | PRN

## 2012-08-03 MED ORDER — ALBUTEROL SULFATE (5 MG/ML) 0.5% IN NEBU
2.5000 mg | INHALATION_SOLUTION | RESPIRATORY_TRACT | Status: DC
  Administered 2012-08-04 (×4): 2.5 mg via RESPIRATORY_TRACT
  Filled 2012-08-03 (×5): qty 0.5

## 2012-08-03 MED ORDER — ONDANSETRON HCL 4 MG/2ML IJ SOLN: 4.0000 mg | Freq: Four times a day (QID) | INTRAMUSCULAR | Status: DC | PRN

## 2012-08-03 MED ORDER — PANTOPRAZOLE SODIUM 40 MG PO TBEC
40.0000 mg | DELAYED_RELEASE_TABLET | Freq: Every day | ORAL | Status: DC
  Administered 2012-08-04 – 2012-08-06 (×3): 40 mg via ORAL
  Filled 2012-08-03 (×3): qty 1

## 2012-08-03 MED ORDER — IPRATROPIUM BROMIDE 0.02 % IN SOLN
0.5000 mg | Freq: Once | RESPIRATORY_TRACT | Status: AC
  Administered 2012-08-03: 0.5 mg via RESPIRATORY_TRACT
  Filled 2012-08-03: qty 2.5

## 2012-08-03 MED ORDER — ALBUTEROL SULFATE 0.63 MG/3ML IN NEBU: 0.6300 mg | INHALATION_SOLUTION | RESPIRATORY_TRACT | Status: DC

## 2012-08-03 MED ORDER — SODIUM CHLORIDE 0.9 % IJ SOLN
3.0000 mL | Freq: Two times a day (BID) | INTRAMUSCULAR | Status: DC
  Administered 2012-08-03: 3 mL via INTRAVENOUS

## 2012-08-03 MED ORDER — ONDANSETRON HCL 4 MG PO TABS
4.0000 mg | ORAL_TABLET | Freq: Four times a day (QID) | ORAL | Status: DC | PRN
  Administered 2012-08-05: 4 mg via ORAL
  Filled 2012-08-03: qty 1

## 2012-08-03 MED ORDER — HEPARIN SODIUM (PORCINE) 5000 UNIT/ML IJ SOLN
5000.0000 [IU] | Freq: Three times a day (TID) | INTRAMUSCULAR | Status: DC
  Administered 2012-08-03 – 2012-08-06 (×7): 5000 [IU] via SUBCUTANEOUS
  Filled 2012-08-03 (×11): qty 1

## 2012-08-03 MED ORDER — ASPIRIN EC 81 MG PO TBEC
81.0000 mg | DELAYED_RELEASE_TABLET | Freq: Every day | ORAL | Status: DC
  Administered 2012-08-04 – 2012-08-06 (×3): 81 mg via ORAL
  Filled 2012-08-03 (×3): qty 1

## 2012-08-03 MED ORDER — SODIUM CHLORIDE 0.9 % IV SOLN
INTRAVENOUS | Status: DC
  Administered 2012-08-03: 19:00:00 via INTRAVENOUS

## 2012-08-03 MED ORDER — ACETAMINOPHEN 325 MG PO TABS
650.0000 mg | ORAL_TABLET | Freq: Four times a day (QID) | ORAL | Status: DC | PRN
  Administered 2012-08-04: 650 mg via ORAL
  Filled 2012-08-03: qty 2

## 2012-08-03 MED ORDER — POTASSIUM CHLORIDE CRYS ER 20 MEQ PO TBCR
40.0000 meq | EXTENDED_RELEASE_TABLET | Freq: Once | ORAL | Status: AC
  Administered 2012-08-03: 40 meq via ORAL
  Filled 2012-08-03: qty 2

## 2012-08-03 NOTE — H&P (Signed)
Hospital Admission Note Date: 08/03/2012  Patient name: [name] Medical record number: [id number] Date of birth: February 16, 1956 Age: 57 y.o. Gender: female PCP: Garth Schlatter, MD   Service:  Internal Medicine Teaching Service   Attending Physician:  Dr. Lars Mage    Chief Complaint:  Dyspnea, productive cough, cold symptoms     History of Present Illness:  This is a 57 year old woman with hypertension, CAD, diabetes, and COPD who presented to the ED today with 5 days of dyspnea, productive cough, and cold symptoms. Symptom onset was 5 days ago with dyspnea. Shortly after the onset of dyspnea, she began suffering from a cough productive of green sputum. Over the next 5 days she had headaches, dizziness, ear fullness, chills, sweats, sinonasal congestion, postnasal drip, sore throat, myalgias, nausea, anorexia, and diarrhea. She denies sneezing, watery eyes, ear pain, dysuria, chest pain, and palpitations.  She has had multiple sick contacts in her family; her son who is a Consulting civil engineer at [employer] was just diagnosed with pneumonia.  Patient Active Problem List  Diagnosis  . HYPERLIPIDEMIA  . DEPRESSION, MAJOR  . BIPOLAR DISORDER UNSPECIFIED  . MULTIPLE PERSONALITY DISORDER  . TOBACCO ABUSE  . NARCOTIC ABUSE  . HYPERTENSION  . CORONARY HEART DISEASE  . C O P D  . G E R D  . OSA (obstructive sleep apnea)  . DM2 (diabetes mellitus, type 2)      Review of Systems:   Constitutional: Positive for chills, malaise/fatigue and diaphoresis.  HENT: Positive for congestion, ear fullness, and sore throat. Negative for hearing loss, ear pain and ear discharge.   Eyes: Negative.  Negative for discharge.  Respiratory: Positive for cough, sputum production, shortness of breath and wheezing.   Cardiovascular: Negative.  Negative for chest pain, palpitations and leg swelling.  Gastrointestinal: Positive for nausea and diarrhea. Negative for vomiting, abdominal pain, constipation and blood in  stool.  Genitourinary: Negative.  Negative for dysuria and hematuria.  Musculoskeletal: Positive for myalgias.  Skin: Negative.  Negative for rash.  Neurological: Positive for dizziness, weakness and headaches.     Medical History: Past Medical History  Diagnosis Date  . Anemia, unspecified   . Major depression   . Narcotic abuse in remission   . Bipolar disorder, unspecified   . Dissociative identity disorder   . HTN (hypertension), benign     a. meds d/c'd by PCP 11/13 due to low BP in setting of significant weight loss and poor appetite related to esophagitis (Toprol restarted late Nov 2013)  . HLD (hyperlipidemia)   . GERD (gastroesophageal reflux disease)   . CAD (coronary artery disease)     a. Cypher DES to mLAD in 2007; b. MV 3/09: EF 72%, no ischemia/infarction.; c. MV (12/11) EF 73%, ? Ant soft tissue atten, no ischemia/infarction; d. LHC (1/12): patent LAD stent, mild LIs only; e. Lex MV (6/13): no ischemia or infarction.   Marland Kitchen DM2 (diabetes mellitus, type 2)   . Hiatal hernia   . COPD (chronic obstructive pulmonary disease)     a. quit smoking 06/2010; b. PFTs (4/10):  FEV1 66%, ratio 64%, DLCO 67%  . Palpitations     a. Holter (12/11) with occasional PVCs. Holter (11/12): rare PACs and PVCs.  Marland Kitchen Hx of echocardiogram     a. Echo (1/08): EF 65%, no significant valvular abnormalities.; b. Echo (12/11): EF 55-60%, grade II diast dysfn, normal wall motion, mild LAE.; c. Echo (5/13) with EF 55-60%, mild LVH.   Marland Kitchen Esophageal  stricture     a. s/p dilatation 7/12;  b. EGD 04/2012 with severe esophagitis and stricture (PPI started and dilatation deferred for several weeks)  . H/O partial nephrectomy 1997  . H/O orthostatic hypotension     probably related to psych meds    Surgical History: Past Surgical History  Procedure Date  . Cesarean section   . Bladder surgery     Tack  . Nephrectomy     Part of left kidney  . Tonsillectomy and adenoidectomy   . Carpal tunnel release      BILATERAL HANDS  . Coronary stent placement 2007    Home Medications: 1. Albuterol 2 puffs every 4 hours as needed for wheezing 2. Aspirin 81 mg daily 3. Clonazepam 0.5 mg 3 times a day 4. Duloxetine 60 mg daily 5. Lisinopril 5 mg daily 6. Metformin 500 mg daily 7. Metoprolol succinate 50 mg daily 8. Mometasone-formoterol 200-5, 2 puffs twice a day 9. Omeprazole 40 mg twice a day 10. Promethazine 12.5-25 mg every 6 hours as needed for nausea 11. Rosuvastatin 40 mg daily 12. Tramadol 50-100 mg every 4 hours as needed for pain 13. Triazolam 0.25 mg at bedtime   Allergies: Allergies as of 08/03/2012 - Review Complete 08/03/2012  Allergen Reaction Noted  . Trazodone hcl Anaphylaxis 06/17/2007  . Cefpodoxime proxetil Other (See Comments)   . Erythromycin ethylsuccinate Nausea And Vomiting 06/17/2007  . Perphenazine Other (See Comments) 01/25/2012    Family History: Family History  Problem Relation Age of Onset  . Emphysema Mother   . Ovarian cancer Mother   . Heart disease Father     mi age 87  . Heart attack Sister 30  . Heart attack Brother 30  . Colon cancer Neg Hx     Social History: History   Social History  . Marital Status: Married    Spouse Name: N/A    Number of Children: 4  . Years of Education: N/A   Occupational History  . Homemaker    Social History Main Topics  . Smoking status: Former Smoker -- 40 years    Types: Cigarettes    Quit date: 07/07/2010  . Smokeless tobacco: Never Used  . Alcohol Use: No  . Drug Use: No  . Sexually Active: Not Currently   Other Topics Concern  . Not on file   Social History Narrative   3-4 caffeine drinks daily    Physical exam: Filed Vitals:   08/03/12 2021  BP: 105/65  Pulse: 91  Temp: 98.2 F (36.8 C)  Resp: 18   GENERAL: well developed, well nourished; no acute distress HEAD: atraumatic, normocephalic EYES: pupils equal, round, 4 mm, and reactive; sclera anicteric; normal  conjunctiva NOSE/THROAT: oropharynx erythematous but without exudates, moist mucous membranes, pink gums, dentures in place NECK: supple, thyroid normal in size and without palpable nodules LYMPH: no cervical or supraclavicular lymphadenopathy LUNGS: wheezing throughout the expiratory cycle, no rales, no respiratory distress HEART: normal rate and regular rhythm; normal S1 and S2 without S3 or S4; no murmurs, rubs, or clicks PULSES: radial 2+ and symmetric ABDOMEN: soft, mild generalized tenderness, normal bowel sounds no masses or organomegaly SKIN: warm, dry, intact, normal turgor, no rashes EXTREMITIES: no peripheral edema, clubbing, or cyanosis PSYCH: patient is alert and oriented, mood and affect are normal and congruent, thought content is normal without delusions, thought process is linear, speech is normal and non-pressured, behavior is normal, judgement and insight are normal     Lab results:  Basic Metabolic Panel:  Basename 08/03/12 1704  NA 135  K 2.8*  CL 89*  CO2 31  GLUCOSE 124*  BUN 26*  CREATININE 2.32*  CALCIUM 10.0  MG --  PHOS --    CBC:  Basename 08/03/12 1704  WBC 18.9*  NEUTROABS 14.4*  HGB 15.1*  HCT 45.0  MCV 97.0  PLT 395    Misc. Labs: Fecal occult blood negative   Imaging results: Dg Chest 2 View 08/03/2012  FINDINGS: Mild basilar atelectasis.  No airspace disease.  No effusion.  Cardiopericardial silhouette appears within normal limits.   IMPRESSION: Mild basilar atelectasis.  No active cardiopulmonary disease.    Other results: EKG Results:  08/03/2012 Rate:  89 PR:  132 QRS:  100 QTc:  521 EKG: unchanged from previous tracings, nonspecific ST and T waves changes.   Assessment and Plan:  1.   Acute COPD exacerbation with acute respiratory failure:  Has all 3 symptoms of acute COPD exacerbation: dyspnea, increased sputum production, and sputum purulence. Chest x-ray did not demonstrate a pneumonia; but with her son who has  pneumonia and her leukocytosis, I would not be surprised if she had a radiographically occult pneumonia. On the other hand, a respiratory virus is probably most likely. Influenza is certainly a possibility despite her flu vaccine. We will treat with IV antibiotics, oral prednisone, and scheduled nebulized treatments. Influenza PCR panel is pending.  - Influenza PCR panel in droplet precautions - IV ceftriaxone  - Oral prednisone 60 mg  - Ipratropium and albuterol nebulized treatments every 4 hours  - O2 therapy to maintain saturations 88-92%  2.   Acute kidney injury and stage III CKD:  Baseline GFR is in the range of 50-60; baseline creatinine is around 1.2.  At admission, her creatinine was up to 2.32 with a GFR calculated at 22. This is most likely prerenal from her anorexia and poor fluid intake over the past 5 days. We will hydrate with IV fluids and check a fractional excretion of sodium.  - Normal saline at 75  - FENa  3.   Hypokalemia:  Potassium was 2.8 admission. She received 40 mEq of oral potassium in the emergency department. This is likely from gastrointestinal losses, stemming from her diarrhea. We will recheck chemistries in the morning. We are also checking magnesium. - Checking magnesium - Repeat chemistries in the morning  4.   Hyperlipidemia:  Last lipid panel was July 2013: cholesterol 359, triglycerides 274, HDL 36, and direct LDL 251. Before this, she had a lipid panel in October 2012: cholesterol 154, triglycerides 154, HDL 36, and LDL 87.  At home, she takes rosuvastatin 40 mg daily, representing appropriate high-intensity statin therapy. This will be replaced with atorvastatin 80 while she is here.  - Atorvastatin 80 mg daily instead of her home rosuvastatin while hospitalized  5.   Depression, bipolar disorder, and anxiety:  Stable. At home she takes clonazepam 0.5 mg 3 times a day and duloxetine 60 mg daily. - Continue clonazepam 0.5 mg 3 times a day - Continue  duloxetine 60 mg daily  6.   Hypertension:  At home, she takes lisinopril 5 mg daily and metoprolol succinate 50 mg daily. Her blood pressures have been quite labile with systolic pressures ranging from 90-170 and diastolic pressures ranging from 50-110. Despite this lability, it seems her average blood pressure is around 120/75. Her blood pressures are on the low side of normal here. Because of her acute kidney injury, we will  hold her lisinopril, but we will continue her metoprolol.  - Hold lisinopril  - Continue metoprolol succinate 50 mg daily   7.   Coronary artery disease:  Stable. EKG with some T wave abnormalities that are unchanged from previous tracings. Nuclear stress test in 2011 did not demonstrate ischemic changes. Previously, she had drug-eluting stents placed in the mid LAD in 2007. Appropriately, she is on a statin, a beta blocker, and an ACE inhibitor, and a daily aspirin. We will hold her lisinopril in the setting of acute kidney injury but continue her other medicines.  - Continue daily aspirin 81 mg   8.   GERD:  Stable. At home, she takes omeprazole 40 mg twice a day.  - Replace omeprazole with pantoprazole 40 mg daily while hospitalized   9.   Type II diabetes:  At home, she takes metformin 500 mg once a day. Her last hemoglobin A1c was 5.9 in February 2013. We are holding her metformin, and we are not covering with any insulin at this time. We will check regular CBGs.  - CBG with meals and at bedtime  - Hold metformin   10. Prophylaxis:  [place] heparin 5000 units 3 times a day   11. Disposition:  Patient has an unassigned PCP and will not require OPC followup. Expected length of stay is greater than 2 days.     Signed by:  Dorthula Rue. Earlene Plater, MD PGY-I, Internal Medicine  08/03/2012, 9:03 PM

## 2012-08-03 NOTE — ED Notes (Signed)
Per EMS, pt sent here from Dr. Isidore Moos with low sats. Wheezing, SOB, SOB, vomiting, diarrhea and sore throat x 5 days.

## 2012-08-03 NOTE — ED Notes (Signed)
Old and new EKG handed to Dr. Bebe Shaggy.  Extra copies of both placed in pt chart

## 2012-08-03 NOTE — ED Notes (Signed)
Patient transported to X-ray 

## 2012-08-03 NOTE — ED Provider Notes (Signed)
History     CSN: [id number]  Arrival date & time 08/03/12  1533   First MD Initiated Contact with Patient 08/03/12 1556      Chief Complaint  Patient presents with  . Shortness of Breath    Pt seen with medical student, I performed history/physical/documentation    Patient is a 57 y.o. female presenting with shortness of breath. The history is provided by the patient.  Shortness of Breath  The current episode started 5 to 7 days ago. The onset was gradual. The problem occurs continuously. The problem has been gradually worsening. The problem is moderate. Nothing relieves the symptoms. Nothing aggravates the symptoms. Associated symptoms include cough, shortness of breath and wheezing. Pertinent negatives include no chest pain.  pt with cough, congestion, shortness of breath, wheezing with greenish sputum production.  She also reports dark stool with diarrhea.  She was seen at an urgent care and sent for evaluation as she had been hypoxic with wheezing    Past Medical History  Diagnosis Date  . Anemia, unspecified   . Major depression   . Narcotic abuse in remission   . Bipolar disorder, unspecified   . Dissociative identity disorder   . HTN (hypertension), benign     a. meds d/c'd by PCP 11/13 due to low BP in setting of significant weight loss and poor appetite related to esophagitis (Toprol restarted late Nov 2013)  . HLD (hyperlipidemia)   . GERD (gastroesophageal reflux disease)   . CAD (coronary artery disease)     a. Cypher DES to mLAD in 2007; b. MV 3/09: EF 72%, no ischemia/infarction.; c. MV (12/11) EF 73%, ? Ant soft tissue atten, no ischemia/infarction; d. LHC (1/12): patent LAD stent, mild LIs only; e. Lex MV (6/13): no ischemia or infarction.   Marland Kitchen DM2 (diabetes mellitus, type 2)   . Hiatal hernia   . COPD (chronic obstructive pulmonary disease)     a. quit smoking 06/2010; b. PFTs (4/10):  FEV1 66%, ratio 64%, DLCO 67%  . Palpitations     a. Holter (12/11) with  occasional PVCs. Holter (11/12): rare PACs and PVCs.  Marland Kitchen Hx of echocardiogram     a. Echo (1/08): EF 65%, no significant valvular abnormalities.; b. Echo (12/11): EF 55-60%, grade II diast dysfn, normal wall motion, mild LAE.; c. Echo (5/13) with EF 55-60%, mild LVH.   Marland Kitchen Esophageal stricture     a. s/p dilatation 7/12;  b. EGD 04/2012 with severe esophagitis and stricture (PPI started and dilatation deferred for several weeks)  . H/O partial nephrectomy 1997  . H/O orthostatic hypotension     probably related to psych meds    Past Surgical History  Procedure Date  . Cesarean section   . Bladder surgery     Tack  . Nephrectomy     Part of left kidney  . Tonsillectomy and adenoidectomy   . Carpal tunnel release     BILATERAL HANDS  . Coronary stent placement 2007    Family History  Problem Relation Age of Onset  . Emphysema Mother   . Ovarian cancer Mother   . Heart disease Father     mi age 72  . Heart attack Sister 30  . Heart attack Brother 30  . Colon cancer Neg Hx     History  Substance Use Topics  . Smoking status: Former Smoker -- 40 years    Types: Cigarettes    Quit date: 07/07/2010  . Smokeless tobacco: Never Used  .  Alcohol Use: No    OB History    Grav Para Term Preterm Abortions TAB SAB Ect Mult Living                  Review of Systems  Constitutional: Positive for chills and fatigue.  Respiratory: Positive for cough, shortness of breath and wheezing.   Cardiovascular: Negative for chest pain.  Gastrointestinal: Positive for diarrhea.  Psychiatric/Behavioral: Negative for agitation.  All other systems reviewed and are negative.    Allergies  Trazodone hcl; Cefpodoxime proxetil; Erythromycin ethylsuccinate; and Perphenazine  Home Medications   Current Outpatient Rx  Name  Route  Sig  Dispense  Refill  . ALBUTEROL SULFATE HFA 108 (90 BASE) MCG/ACT IN AERS   Inhalation   Inhale 2 puffs into the lungs every 4 (four) hours as needed. For  wheezing         . ASPIRIN EC 81 MG PO TBEC   Oral   Take 81 mg by mouth daily.         Marland Kitchen CLONAZEPAM 0.5 MG PO TABS   Oral   Take 0.5 mg by mouth 3 (three) times daily. anxiety         . DULOXETINE HCL 60 MG PO CPEP   Oral   Take 60 mg by mouth daily.          . IMODIUM PO   Oral   Take 1 tablet by mouth as needed. For diarrhea         . METFORMIN HCL 500 MG PO TABS   Oral   Take 500 mg by mouth every morning.          Marland Kitchen NITROGLYCERIN 0.4 MG SL SUBL   Sublingual   Place 1 tablet (0.4 mg total) under the tongue every 5 (five) minutes as needed. For chest pain   40 tablet   6   . OMEPRAZOLE 40 MG PO CPDR   Oral   Take 80 mg by mouth daily.         Frazier Butt OP   Both Eyes   Place 1 drop into both eyes 2 (two) times daily as needed. For dry eyes         . PROMETHAZINE HCL 25 MG PO TABS   Oral   Take 12.5-25 mg by mouth every 6 (six) hours as needed. For nausea         . ROSUVASTATIN CALCIUM 40 MG PO TABS   Oral   Take 1 tablet (40 mg total) by mouth daily.   30 tablet   3   . TRIAZOLAM 0.25 MG PO TABS   Oral   Take 0.25 mg by mouth at bedtime. For sleep           BP 107/69  Pulse 91  Temp 98.1 F (36.7 C) (Oral)  Resp 20  SpO2 93% BP 111/65  Pulse 87  Temp 98.1 F (36.7 C) (Oral)  Resp 20  SpO2 88%  Physical Exam CONSTITUTIONAL: Well developed/well nourished HEAD AND FACE: Normocephalic/atraumatic EYES: EOMI/PERRL ENMT: Mucous membranes moist NECK: supple no meningeal signs SPINE:entire spine nontender CV: S1/S2 noted, no murmurs/rubs/gallops noted LUNGS: wheezing bilaterally, coarse breath sounds noted, but she is in no distress ABDOMEN: soft, nontender, no rebound or guarding GU:no cva tenderness Rectal - hemoccult negative (performed by medical student with chaperone present) NEURO: Pt is awake/alert, moves all extremitiesx4 EXTREMITIES: pulses normal, full ROM, no edema SKIN: warm, color normal PSYCH: no  abnormalities of  mood noted  ED Course  Procedures   4:48 PM Pt sent from UC for further evaluation of SOB Will repeat CXR as results from urgent care not available Will also give neb treatments and reassess  7:01 PM Pt is in no distress but hypoxic (this corrects with oxygen) and hypokalemic with prolonged qt She is also dehydrated with AKI Will admit D/w internal medicine Will admit to tele due to hypoK and ekg changes  MDM  Nursing notes including past medical history and social history reviewed and considered in documentation Labs/vital reviewed and considered Previous records reviewed and considered - urgent care notes reviewed xrays reviewed and considered        Date: 08/03/2012  Rate: 89  Rhythm: normal sinus rhythm  QRS Axis: normal  Intervals: prolonged qt  ST/T Wave abnormalities: nonspecific ST changes  Conduction Disutrbances:none  Narrative Interpretation:   Old EKG Reviewed: unchanged    Joya Gaskins, MD 08/03/12 [phone]

## 2012-08-04 ENCOUNTER — Encounter (HOSPITAL_COMMUNITY): Payer: Self-pay | Admitting: General Practice

## 2012-08-04 DIAGNOSIS — I2510 Atherosclerotic heart disease of native coronary artery without angina pectoris: Secondary | ICD-10-CM

## 2012-08-04 DIAGNOSIS — I10 Essential (primary) hypertension: Secondary | ICD-10-CM

## 2012-08-04 DIAGNOSIS — F3130 Bipolar disorder, current episode depressed, mild or moderate severity, unspecified: Secondary | ICD-10-CM

## 2012-08-04 LAB — CREATININE, URINE, RANDOM: Creatinine, Urine: 110.28 mg/dL

## 2012-08-04 LAB — BASIC METABOLIC PANEL WITH GFR
BUN: 30 mg/dL — ABNORMAL HIGH (ref 6–23)
CO2: 31 meq/L (ref 19–32)
Calcium: 9.9 mg/dL (ref 8.4–10.5)
Chloride: 92 meq/L — ABNORMAL LOW (ref 96–112)
Creatinine, Ser: 2.5 mg/dL — ABNORMAL HIGH (ref 0.50–1.10)
GFR calc Af Amer: 23 mL/min — ABNORMAL LOW
GFR calc non Af Amer: 20 mL/min — ABNORMAL LOW
Glucose, Bld: 158 mg/dL — ABNORMAL HIGH (ref 70–99)
Potassium: 4.1 meq/L (ref 3.5–5.1)
Sodium: 137 meq/L (ref 135–145)

## 2012-08-04 LAB — GLUCOSE, CAPILLARY
Glucose-Capillary: 172 mg/dL — ABNORMAL HIGH (ref 70–99)
Glucose-Capillary: 135 mg/dL — ABNORMAL HIGH (ref 70–99)
Glucose-Capillary: 128 mg/dL — ABNORMAL HIGH (ref 70–99)
Glucose-Capillary: 146 mg/dL — ABNORMAL HIGH (ref 70–99)

## 2012-08-04 LAB — CBC
HCT: 42 % (ref 36.0–46.0)
MCHC: 31.4 g/dL (ref 30.0–36.0)
Platelets: 324 10*3/uL (ref 150–400)
RDW: 13.9 % (ref 11.5–15.5)
WBC: 12.7 10*3/uL — ABNORMAL HIGH (ref 4.0–10.5)

## 2012-08-04 LAB — SODIUM, URINE, RANDOM: Sodium, Ur: 19 mEq/L

## 2012-08-04 LAB — INFLUENZA PANEL BY PCR (TYPE A & B)
H1N1 flu by pcr: NOT DETECTED
Influenza A By PCR: NEGATIVE
Influenza B By PCR: NEGATIVE

## 2012-08-04 MED ORDER — MENTHOL 3 MG MT LOZG
1.0000 | LOZENGE | OROMUCOSAL | Status: DC | PRN
  Administered 2012-08-04: 3 mg via ORAL
  Filled 2012-08-04: qty 9

## 2012-08-04 MED ORDER — SODIUM CHLORIDE 0.9 % IV BOLUS (SEPSIS)
500.0000 mL | Freq: Once | INTRAVENOUS | Status: AC
  Administered 2012-08-04: 500 mL via INTRAVENOUS

## 2012-08-04 MED ORDER — IPRATROPIUM BROMIDE 0.02 % IN SOLN
0.5000 mg | Freq: Four times a day (QID) | RESPIRATORY_TRACT | Status: DC
  Administered 2012-08-05 (×2): 0.5 mg via RESPIRATORY_TRACT
  Filled 2012-08-04 (×2): qty 2.5

## 2012-08-04 MED ORDER — MAGNESIUM SULFATE 40 MG/ML IJ SOLN
2.0000 g | Freq: Once | INTRAMUSCULAR | Status: AC
  Administered 2012-08-04: 2 g via INTRAVENOUS
  Filled 2012-08-04: qty 50

## 2012-08-04 MED ORDER — BIOTENE DRY MOUTH MT LIQD
15.0000 mL | Freq: Two times a day (BID) | OROMUCOSAL | Status: DC
  Administered 2012-08-05 – 2012-08-06 (×3): 15 mL via OROMUCOSAL

## 2012-08-04 MED ORDER — ALBUTEROL SULFATE (5 MG/ML) 0.5% IN NEBU
2.5000 mg | INHALATION_SOLUTION | Freq: Four times a day (QID) | RESPIRATORY_TRACT | Status: DC
  Administered 2012-08-05 (×2): 2.5 mg via RESPIRATORY_TRACT
  Filled 2012-08-04 (×2): qty 0.5

## 2012-08-04 MED ORDER — HYDROCODONE-ACETAMINOPHEN 5-325 MG PO TABS
1.0000 | ORAL_TABLET | Freq: Four times a day (QID) | ORAL | Status: DC | PRN
  Administered 2012-08-04 – 2012-08-06 (×5): 2 via ORAL
  Filled 2012-08-04 (×5): qty 2

## 2012-08-04 MED ORDER — ALBUTEROL SULFATE (5 MG/ML) 0.5% IN NEBU: 2.5000 mg | INHALATION_SOLUTION | RESPIRATORY_TRACT | Status: DC

## 2012-08-04 MED ORDER — ALBUTEROL SULFATE (5 MG/ML) 0.5% IN NEBU: 2.5000 mg | INHALATION_SOLUTION | RESPIRATORY_TRACT | Status: DC | PRN

## 2012-08-04 MED ORDER — INSULIN ASPART 100 UNIT/ML ~~LOC~~ SOLN
0.0000 [IU] | Freq: Three times a day (TID) | SUBCUTANEOUS | Status: DC
  Administered 2012-08-04: 1 [IU] via SUBCUTANEOUS

## 2012-08-04 NOTE — Progress Notes (Addendum)
Subjective:    Patient states her SOB is modestly improved. She has no other complaints this AM, and denies any CP.   Interval Events: No acute events.    Objective:    Vital Signs:   Temp:  [98.1 F (36.7 C)-98.2 F (36.8 C)] 98.1 F (36.7 C) (01/09 0435) Pulse Rate:  [87-93] 90  (01/09 0435) Resp:  [18-20] 18  (01/08 1932) BP: (95-111)/(50-94) 108/94 mmHg (01/09 0435) SpO2:  [81 %-93 %] 93 % (01/09 0453) Weight:  [186 lb 12.5 oz (84.723 kg)] 186 lb 12.5 oz (84.723 kg) (01/08 2021) Last BM Date: 08/03/12  24-hour weight change: Weight change:   Intake/Output:  No intake or output data in the 24 hours ending 08/04/12 0937    Physical Exam: General: Vital signs reviewed and noted. Well-developed, well-nourished, in no acute distress; alert, appropriate and cooperative throughout examination.  Lungs:  Normal respiratory effort. Mild, scattered wheezes and coarse BS in all lung fields.   Heart: RRR. S1 and S2 normal without gallop, murmur, or rubs.  Abdomen:  BS normoactive. Soft, Nondistended, non-tender.  No masses or organomegaly.  Extremities: No pretibial edema.     Labs:  Basic Metabolic Panel:  Lab 08/04/12 4098 08/03/12 2137 08/03/12 1704  NA 137 -- 135  K 4.1 -- 2.8*  CL 92* -- 89*  CO2 31 -- 31  GLUCOSE 158* -- 124*  BUN 30* -- 26*  CREATININE 2.50* -- 2.32*  CALCIUM 9.9 -- 10.0  MG -- 1.4* --  PHOS -- -- --    Liver Function Tests:  Lab 08/03/12 2137  AST 13  ALT 7  ALKPHOS 65  BILITOT 0.5  PROT 7.2  ALBUMIN 3.1*   CBC:  Lab 08/04/12 0552 08/03/12 1704  WBC 12.7* 18.9*  NEUTROABS -- 14.4*  HGB 13.2 15.1*  HCT 42.0 45.0  MCV 98.4 97.0  PLT 324 395   CBG:  Lab 08/04/12 0728 08/03/12 2029  GLUCAP 135* 172*    Microbiology: Results for orders placed in visit on 05/24/12  HELICOBACTER PYLORI SCREEN-BIOPSY     Status: Normal   Collection Time   05/24/12  8:57 AM      Component Value Range Status Comment   UREASE Negative   Negative Final     Imaging: Dg Chest 2 View  08/03/2012  *RADIOLOGY REPORT*  Clinical Data: Short of breath.  CHEST - 2 VIEW  Comparison: 05/06/2012, 03/21/2012.  Findings: Mild basilar atelectasis.  No airspace disease.  No effusion.  Cardiopericardial silhouette appears within normal limits.  IMPRESSION: Mild basilar atelectasis.  No active cardiopulmonary disease.   Original Report Authenticated By: Andreas Newport, M.D.        Medications:    Infusions:    . sodium chloride 75 mL/hr at 08/04/12 0900    Scheduled Medications:    . albuterol  2.5 mg Nebulization Q4H  . aspirin EC  81 mg Oral Daily  . atorvastatin  80 mg Oral q1800  . cefTRIAXone (ROCEPHIN)  IV  1 g Intravenous Q24H  . clonazePAM  0.5 mg Oral TID  . DULoxetine  60 mg Oral Daily  . heparin  5,000 Units Subcutaneous Q8H  . ipratropium  0.5 mg Nebulization Q4H  . magnesium sulfate 1 - 4 g bolus IVPB  2 g Intravenous Once  . metoprolol succinate  50 mg Oral Daily  . mometasone-formoterol  2 puff Inhalation BID  . pantoprazole  40 mg Oral Daily  . predniSONE  60 mg  Oral Q breakfast  . sodium chloride  3 mL Intravenous Q12H  . triazolam  0.25 mg Oral QHS    PRN Medications: acetaminophen, acetaminophen, ondansetron (ZOFRAN) IV, ondansetron   Assessment/ Plan:   Acute COPD exacerbation with acute respiratory failure: 3/3 cardinal symptoms. Pt may have PNA, although not revealed on CXR. Flu PCR pending.  - cont IV ceftriaxone (likely transition to PO w/in next 24hrs) - cont prednisone 60mg  QD - cont duonebs q4h - maintain O2 sats 88-92% - repeat CXR after rehydrated  Acute kidney injury and stage III CKD - Cr remains elevated above baseline this AM. FENa = 0.3%, favoring prerenal azotemia. Pt has only received a 500cc bolus in the ED in addition to her maintenance fluids, thus will escalate IV rehydration as Cr not improved.  - NS 500cc bolus - NS @ 125cc/hr  Hypokalemia -  resolved.   Hyperlipidemia -  cont statin.   Depression, bipolar disorder, and anxiety -  Stable. At home she takes clonazepam 0.5 mg 3 times a day and duloxetine 60 mg daily.  - Continue clonazepam 0.5 mg 3 times a day  - Continue duloxetine 60 mg daily   Hypertension - stable. Cont to hold lisinopril in setting of AKI. Cont toprol XL 50mg  QD.   Coronary artery disease - Stable. - Continue daily aspirin 81 mg   GERD: Stable. Cont protonix.   Type II diabetes - slightly elevated CBGs since admission. Will start SSI while on prednisone.  - cont CBGs - SSI   DVT PPX - heparin  CODE STATUS - full  CONSULTS PLACED - N/A  DISPO - Disposition is deferred at this time, awaiting improvement of COPD exacerbation.   Anticipated discharge in approximately 1-2 day(s).   The patient does have a current OPC PCP (AMEEN, Joylene John, MD) and does not need an [facility] hospital follow-up appointment after discharge.    Is the [facility] hospital follow-up appointment a one-time only appointment? not applicable.  Does the patient have transportation limitations that hinder transportation to clinic appointments? no   SERVICE NEEDED AT DISCHARGE - TO BE DETERMINED DURING HOSPITAL COURSE         Y = Yes, Blank = No PT:   OT:   RN:   Equipment:   Other:      Length of Stay: 1 day(s)   Signed: Elfredia Nevins, MD  PGY-1, Internal Medicine Resident Pager: [phone] (7AM-5PM) 08/04/2012, 9:37 AM

## 2012-08-04 NOTE — Progress Notes (Signed)
Utilization review completed.  

## 2012-08-04 NOTE — H&P (Signed)
Internal Medicine Attending Admission Note Date: 08/04/2012  Patient name: [name] Medical record number: [id number] Date of birth: 11-04-1955 Age: 57 y.o. Gender: female  I saw and evaluated the patient. I reviewed the resident's note and I agree with the resident's findings and plan as documented in the resident's note.  Chief Complaint(s): Shortness of breath  History - key components related to admission: Patient is a 57 year old woman with past medical history of coronary artery disease, hypertension and COPD who presented to [facility] with 5 days of dyspnea. Patient said that he has sick contacts at home and the full illness started about a week ago as a sore throat. Patient started having cough with green colored sputum on Saturday that is 5 days prior to admission. Cough was associated with feeling of chest congestion and shortness of breath which progressively got worse. One day prior to admission patient was seen by her primary care physician who did initial labs and a chest x-ray and patient was advised to come to the ER. Patient has albuterol inhaler at home which she rarely uses that used it about 3-4 times every day in last 1 week. Patient already feels better with 1 day of steroids, antibiotics and breathing treatments.  She denies any fever, chest pain, palpitations, chills, change in urinary habits, abdominal pain, headaches, ear pain, change in sleeping pattern. Review of system is positive for anorexia and loose bowel movements since last 4-5 days. 10 point review of system was performed and was otherwise negative except what is noted above.  Medical history, surgical history, medications, social history and allergies as per resident's note.   Physical Exam - key components related to admission:  Filed Vitals:   08/03/12 1932 08/03/12 2021 08/04/12 0435 08/04/12 0453  BP:  105/65 108/94   Pulse:  91 90   Temp:  98.2 F (36.8 C) 98.1 F (36.7 C)     TempSrc:  Oral Oral   Resp: 18     Height:  5\' 6"  (1.676 m)    Weight:  186 lb 12.5 oz (84.723 kg)    SpO2:  92% 93% 93%  Physical Exam: General: Vital signs reviewed and noted. Well-developed, well-nourished, in no acute distress; alert, appropriate and cooperative throughout examination.  Head: Normocephalic, atraumatic.  Eyes: PERRL, EOMI, No signs of anemia or jaundince.  Nose: Mucous membranes moist, not inflammed, nonerythematous.  Throat: Oropharynx nonerythematous, no exudate appreciated.   Neck: No deformities, masses, or tenderness noted.Supple, No carotid Bruits, no JVD.  Lungs:   Patient had bilateral diffuse expiratory wheezes throughout lung fields   Heart: RRR. S1 and S2 normal without gallop, murmur, or rubs.  Abdomen:  BS normoactive. Soft, Nondistended, non-tender.  No masses or organomegaly.  Extremities: No pretibial edema.  Neurologic: A&O X3, CN II - XII are grossly intact. Motor strength is 5/5 in the all 4 extremities, Sensations intact to light touch, Cerebellar signs negative.  Skin: multiple acne type lesions noted on the back in various stages of healing process      Lab results:   Basic Metabolic Panel:  Basename 08/04/12 0552 08/03/12 2137 08/03/12 1704  NA 137 -- 135  K 4.1 -- 2.8*  CL 92* -- 89*  CO2 31 -- 31  GLUCOSE 158* -- 124*  BUN 30* -- 26*  CREATININE 2.50* -- 2.32*  CALCIUM 9.9 -- 10.0  MG -- 1.4* --  PHOS -- -- --   Liver Function Tests:  [facility] 08/03/12 2137  AST 13  ALT 7  ALKPHOS 65  BILITOT 0.5  PROT 7.2  ALBUMIN 3.1*   CBC:  Basename 08/04/12 0552 08/03/12 1704  WBC 12.7* 18.9*  NEUTROABS -- 14.4*  HGB 13.2 15.1*  HCT 42.0 45.0  MCV 98.4 97.0  PLT 324 395   CBG:  Basename 08/04/12 0728 08/03/12 2029  GLUCAP 135* 172*    Imaging results:  Dg Chest 2 View  08/03/2012  *RADIOLOGY REPORT*  Clinical Data: Short of breath.  CHEST - 2 VIEW  Comparison: 05/06/2012, 03/21/2012.  Findings: Mild basilar  atelectasis.  No airspace disease.  No effusion.  Cardiopericardial silhouette appears within normal limits.  IMPRESSION: Mild basilar atelectasis.  No active cardiopulmonary disease.   Original Report Authenticated By: Andreas Newport, M.D.     Other results: EKG: 89 beats per minute, normal axis, T wave inversion and flattening noted in aVL, V1 and V2 which were seen on EKG done on December 26. No changes noted as compared to previous EKG  Assessment & Plan by Problem:  Principal Problem:  *COPD exacerbation Active Problems:  HYPERLIPIDEMIA  DEPRESSION, MAJOR  BIPOLAR DISORDER UNSPECIFIED  HYPERTENSION  CORONARY HEART DISEASE  G E R D  Hypokalemia  Acute respiratory failure  AKI (acute kidney injury)  DM2 (diabetes mellitus, type 2)  Stage III CKD  Patient has classical signs and symptoms consistent with COPD exacerbation and already feels improved with antibiotics, steroids and nebulization treatments. Patient is currently not on tiotropium inhaler and moving forward that may be one thing that patient may benefit and may reduce future hospitalizations. Patient was also noted to have an acute kidney injury which most likely seems to be prerenal. We will followup basic metabolic profile in a.m. and treat with IV fluids.  Rest of the medical management as per resident's note.  Lars Mage MD Faculty-Internal Medicine Residency Program 08/04/2012 9:59 AM

## 2012-08-05 ENCOUNTER — Inpatient Hospital Stay (HOSPITAL_COMMUNITY): Payer: No Typology Code available for payment source

## 2012-08-05 LAB — CBC
Hemoglobin: 12.4 g/dL (ref 12.0–15.0)
MCH: 31.6 pg (ref 26.0–34.0)
MCHC: 32 g/dL (ref 30.0–36.0)
Platelets: 296 10*3/uL (ref 150–400)
RDW: 14 % (ref 11.5–15.5)

## 2012-08-05 LAB — GLUCOSE, CAPILLARY
Glucose-Capillary: 99 mg/dL (ref 70–99)
Glucose-Capillary: 119 mg/dL — ABNORMAL HIGH (ref 70–99)
Glucose-Capillary: 118 mg/dL — ABNORMAL HIGH (ref 70–99)

## 2012-08-05 LAB — BASIC METABOLIC PANEL
BUN: 28 mg/dL — ABNORMAL HIGH (ref 6–23)
Calcium: 9.1 mg/dL (ref 8.4–10.5)
Creatinine, Ser: 1.74 mg/dL — ABNORMAL HIGH (ref 0.50–1.10)
GFR calc Af Amer: 36 mL/min — ABNORMAL LOW (ref >90)
GFR calc non Af Amer: 31 mL/min — ABNORMAL LOW (ref >90)
Glucose, Bld: 114 mg/dL — ABNORMAL HIGH (ref 70–99)
Potassium: 3 mEq/L — ABNORMAL LOW (ref 3.5–5.1)

## 2012-08-05 MED ORDER — POTASSIUM CHLORIDE CRYS ER 20 MEQ PO TBCR
40.0000 meq | EXTENDED_RELEASE_TABLET | ORAL | Status: AC
  Administered 2012-08-05 (×2): 40 meq via ORAL
  Filled 2012-08-05 (×2): qty 2

## 2012-08-05 MED ORDER — ALBUTEROL SULFATE (5 MG/ML) 0.5% IN NEBU
2.5000 mg | INHALATION_SOLUTION | RESPIRATORY_TRACT | Status: DC
  Administered 2012-08-05 – 2012-08-06 (×4): 2.5 mg via RESPIRATORY_TRACT
  Filled 2012-08-05 (×5): qty 0.5

## 2012-08-05 MED ORDER — TRAMADOL HCL 50 MG PO TABS
50.0000 mg | ORAL_TABLET | Freq: Four times a day (QID) | ORAL | Status: DC | PRN
  Administered 2012-08-05 (×2): 100 mg via ORAL
  Filled 2012-08-05 (×2): qty 2

## 2012-08-05 MED ORDER — IPRATROPIUM BROMIDE 0.02 % IN SOLN
0.5000 mg | RESPIRATORY_TRACT | Status: DC
  Administered 2012-08-05 – 2012-08-06 (×4): 0.5 mg via RESPIRATORY_TRACT
  Filled 2012-08-05 (×5): qty 2.5

## 2012-08-05 NOTE — Progress Notes (Signed)
Subjective:    Pt states [name] is feeling a little better today. Still complaints of SOB, cough. No fevers/chills.   Interval Events: No acute events.    Objective:    Vital Signs:   Temp:  [97 F (36.1 C)-98.5 F (36.9 C)] 98.5 F (36.9 C) (01/10 0518) Pulse Rate:  [66-87] 66  (01/10 0518) Resp:  [18-20] 18  (01/10 0518) BP: (96-119)/(60-72) 117/67 mmHg (01/10 0518) SpO2:  [90 %-95 %] 92 % (01/10 0844) Last BM Date: 08/03/12  24-hour weight change: Weight change:   Intake/Output:   Intake/Output Summary (Last 24 hours) at 08/05/12 1203 Last data filed at 08/05/12 0900  Gross per 24 hour  Intake    120 ml  Output   2200 ml  Net  -2080 ml      Physical Exam: General: Vital signs reviewed and noted. Well-developed, well-nourished, in no acute distress; alert, appropriate and cooperative throughout examination.  Lungs: Normal respiratory effort. Mild, scattered wheezes and coarse BS in all lung fields.  Heart: RRR. S1 and S2 normal without gallop, murmur, or rubs.  Abdomen: BS normoactive. Soft, Nondistended, non-tender. No masses or organomegaly.  Extremities: No pretibial edema.   Labs:  Basic Metabolic Panel:  Lab 08/05/12 4098 08/04/12 0552 08/03/12 2137 08/03/12 1704  NA 143 137 -- 135  K 3.0* 4.1 -- 2.8*  CL 100 92* -- 89*  CO2 32 31 -- 31  GLUCOSE 114* 158* -- 124*  BUN 28* 30* -- 26*  CREATININE 1.74* 2.50* -- 2.32*  CALCIUM 9.1 9.9 -- 10.0  MG -- -- 1.4* --  PHOS -- -- -- --   Liver Function Tests:  Lab 08/03/12 2137  AST 13  ALT 7  ALKPHOS 65  BILITOT 0.5  PROT 7.2  ALBUMIN 3.1*   CBC:  Lab 08/05/12 0550 08/04/12 0552 08/03/12 1704  WBC 14.7* 12.7* 18.9*  NEUTROABS -- -- 14.4*  HGB 12.4 13.2 15.1*  HCT 38.8 42.0 45.0  MCV 99.0 98.4 97.0  PLT 296 324 395   CBG:  Lab 08/05/12 1143 08/05/12 0730 08/04/12 2117 08/04/12 1638 08/04/12 1130  GLUCAP 119* 99 146* 139* 128*    Microbiology: Results for orders placed in visit on  05/24/12  HELICOBACTER PYLORI SCREEN-BIOPSY     Status: Normal   Collection Time   05/24/12  8:57 AM      Component Value Range Status Comment   UREASE Negative  Negative Final    Imaging: Dg Chest 2 View  08/05/2012  *RADIOLOGY REPORT*  Clinical Data: Cough, shortness of breath, fever, history hypertension, coronary artery disease, diabetes, COPD  CHEST - 2 VIEW  Comparison: 08/03/2012  Findings: Normal heart size, mediastinal contours, and pulmonary vascularity. Mild persistent peribronchial thickening. No pulmonary infiltrate, pleural effusion or pneumothorax. No acute osseous findings. Scattered costicartilages calcifications.  IMPRESSION: Mild chronic bronchitic changes. No acute abnormalities.   Original Report Authenticated By: Ulyses Southward, M.D.    Dg Chest 2 View  08/03/2012  *RADIOLOGY REPORT*  Clinical Data: Short of breath.  CHEST - 2 VIEW  Comparison: 05/06/2012, 03/21/2012.  Findings: Mild basilar atelectasis.  No airspace disease.  No effusion.  Cardiopericardial silhouette appears within normal limits.  IMPRESSION: Mild basilar atelectasis.  No active cardiopulmonary disease.   Original Report Authenticated By: Andreas Newport, M.D.        Medications:    Infusions:    . sodium chloride 125 mL/hr at 08/05/12 0610    Scheduled Medications:    .  albuterol  2.5 mg Nebulization QID  . antiseptic oral rinse  15 mL Mouth Rinse BID  . aspirin EC  81 mg Oral Daily  . atorvastatin  80 mg Oral q1800  . cefTRIAXone (ROCEPHIN)  IV  1 g Intravenous Q24H  . clonazePAM  0.5 mg Oral TID  . DULoxetine  60 mg Oral Daily  . heparin  5,000 Units Subcutaneous Q8H  . insulin aspart  0-9 Units Subcutaneous TID WC  . ipratropium  0.5 mg Nebulization QID  . metoprolol succinate  50 mg Oral Daily  . mometasone-formoterol  2 puff Inhalation BID  . pantoprazole  40 mg Oral Daily  . potassium chloride  40 mEq Oral Q4H  . predniSONE  60 mg Oral Q breakfast  . sodium chloride  3 mL  Intravenous Q12H  . triazolam  0.25 mg Oral QHS    PRN Medications: acetaminophen, acetaminophen, albuterol, HYDROcodone-acetaminophen, menthol-cetylpyridinium, ondansetron (ZOFRAN) IV, ondansetron   Assessment/ Plan:   Acute COPD exacerbation with acute respiratory failure: 3/3 cardinal symptoms. Repeat CXR neg for PNA after IV rehydration.  - cont IV ceftriaxone - cont prednisone 60mg  QD  - cont duonebs q4h - maintain O2 sats 88-92%    Acute kidney injury and stage III CKD - Cr improved this AM after receiving 1000cc NS bolus yesterday in addition to her maintenance NS @ 125cc/hr. - NS @ 125cc/hr   Hypokalemia - K = 3.0 this AM. Repleting with kdur.   Hyperlipidemia - cont statin.   Depression, bipolar disorder, and anxiety - Stable. At home [name] takes clonazepam 0.5 mg 3 times a day and duloxetine 60 mg daily.  - Continue clonazepam 0.5 mg 3 times a day  - Continue duloxetine 60 mg daily   Hypertension - stable. Cont to hold lisinopril in setting of AKI. Cont toprol XL 50mg  QD.   Coronary artery disease - Stable.  - Continue daily aspirin 81 mg   GERD: Stable. Cont protonix.   Type II diabetes - slightly elevated CBGs since admission. Will start SSI while on prednisone.  - cont CBGs  - SSI   DVT PPX - heparin  CODE STATUS - full CONSULTS PLACED - N/A DISPO - Disposition is deferred at this time, awaiting improvement of COPD exacerbation.  Anticipated discharge in approximately 1-2 day(s).  The patient does have a current [facility] PCP (AMEEN, Joylene John, MD) and does not need an [facility] hospital follow-up appointment after discharge.  Is the [facility] hospital follow-up appointment a one-time only appointment? not applicable.  Does the patient have transportation limitations that hinder transportation to clinic appointments? no SERVICE NEEDED AT DISCHARGE - TO BE DETERMINED DURING HOSPITAL COURSE Y = Yes, Blank = No  PT:    OT:    RN:    Equipment:    Other:    Length of Stay:  2 day(s)  Signed:  Elfredia Nevins, MD  PGY-2, Internal Medicine Resident  Pager: [phone] (7AM-5PM)  08/05/2012, 12:07 PM

## 2012-08-05 NOTE — Discharge Summary (Signed)
Patient Name:  [name] MRN: [id number]  PCP: Garth Schlatter, MD DOB:  1956-06-05       Date of Admission:  08/03/2012  Date of Discharge:  08/06/2012     Attending Physician: Dr. Lars Mage, MD         DISCHARGE DIAGNOSES: Acute COPD exacerbation with acute respiratory failure Acute kidney injury and stage III CKD Hypokalemia Hyperlipidemia Depression, bipolar disorder, and anxiety Hypertension Coronary artery disease  GERD Type II diabetes   DISPOSITION AND FOLLOW-UP: [name] is to follow-up with the listed providers as detailed below, at which time, the following should be addressed:   1. F/u recent COPD exacerbation and assess for resolution of symptoms 2. F/u recent AKI, hypokalemia with BMET 3. Labs / imaging needed: BMET 4. Pending labs/ test needing follow-up: none  Follow-up Information    Call Garth Schlatter, MD. (to make appointment in 1 - 2 weeks)    Contact information:   [facility] [address] [place] [address] [phone]             Discharge Orders    Future Appointments: Provider: Department: Dept Phone: Center:   09/29/2012 1:45 PM Laurey Morale, MD [facility] Main Office [place]) [phone] [email or web address]     Future Orders Please Complete By Expires   Diet - low sodium heart healthy      Increase activity slowly          DISCHARGE MEDICATIONS:   Medication List     As of 08/06/2012 11:08 AM    STOP taking these medications         lisinopril 5 MG tablet   Commonly known as: PRINIVIL,ZESTRIL      TAKE these medications         albuterol 108 (90 BASE) MCG/ACT inhaler   Commonly known as: PROVENTIL HFA;VENTOLIN HFA   Inhale 2 puffs into the lungs every 4 (four) hours as needed. For wheezing      aspirin EC 81 MG tablet   Take 81 mg by mouth daily.      clonazePAM 0.5 MG tablet   Commonly known as: KLONOPIN   Take 0.5 mg by mouth 3 (three) times  daily. anxiety      doxycycline 100 MG tablet   Commonly known as: VIBRA-TABS   Take 1 tablet (100 mg total) by mouth 2 (two) times daily.      DULoxetine 60 MG capsule   Commonly known as: CYMBALTA   Take 60 mg by mouth daily.      metFORMIN 500 MG tablet   Commonly known as: GLUCOPHAGE   Take 500 mg by mouth every morning.      metoprolol succinate 50 MG 24 hr tablet   Commonly known as: TOPROL-XL   Take 50 mg by mouth daily. Take with or immediately following a meal.      mometasone-formoterol 200-5 MCG/ACT Aero   Commonly known as: DULERA   Inhale 2 puffs into the lungs 2 (two) times daily.      omeprazole 40 MG capsule   Commonly known as: PRILOSEC   Take 40 mg by mouth 2 (two) times daily.      predniSONE 20 MG tablet   Commonly known as: DELTASONE   Take 3 tablets in the morning for 3 days then take 2 pills in the morning for 3 days then take 1 pill in the morning for 3 days  then stop.      promethazine 25 MG tablet   Commonly known as: PHENERGAN   Take 12.5-25 mg by mouth every 6 (six) hours as needed. For nausea      rosuvastatin 40 MG tablet   Commonly known as: CRESTOR   Take 1 tablet (40 mg total) by mouth daily.      traMADol 50 MG tablet   Commonly known as: ULTRAM   Take 50-100 mg by mouth every 4 (four) hours as needed. For pain      triazolam 0.25 MG tablet   Commonly known as: HALCION   Take 0.25 mg by mouth at bedtime. For sleep          CONSULTS:    None   PROCEDURES PERFORMED:  Dg Chest 2 View  08/05/2012  *RADIOLOGY REPORT*  Clinical Data: Cough, shortness of breath, fever, history hypertension, coronary artery disease, diabetes, COPD  CHEST - 2 VIEW  Comparison: 08/03/2012  Findings: Normal heart size, mediastinal contours, and pulmonary vascularity. Mild persistent peribronchial thickening. No pulmonary infiltrate, pleural effusion or pneumothorax. No acute osseous findings. Scattered costicartilages calcifications.  IMPRESSION: Mild  chronic bronchitic changes. No acute abnormalities.   Original Report Authenticated By: Ulyses Southward, M.D.    Dg Chest 2 View  08/03/2012  *RADIOLOGY REPORT*  Clinical Data: Short of breath.  CHEST - 2 VIEW  Comparison: 05/06/2012, 03/21/2012.  Findings: Mild basilar atelectasis.  No airspace disease.  No effusion.  Cardiopericardial silhouette appears within normal limits.  IMPRESSION: Mild basilar atelectasis.  No active cardiopulmonary disease.   Original Report Authenticated By: Andreas Newport, M.D.    Ct Head Wo Contrast  07/16/2012  *RADIOLOGY REPORT*  Clinical Data: Hypertension and blurred vision.  CT HEAD WITHOUT CONTRAST  Technique:  Contiguous axial images were obtained from the base of the skull through the vertex without contrast.  Comparison: 05/16/2012  Findings: No evidence for acute hemorrhage, mass lesion, midline shift, hydrocephalus or large infarct.  Normal appearance of the ventricles and basal cisterns. There is concern for exophthalmos which may be chronic.  No acute bony abnormality.  IMPRESSION: No acute intracranial abnormality.  Possible exophthalmos and recommend clinical correlation.   Original Report Authenticated By: Richarda Overlie, M.D.        ADMISSION DATA: H&P: Patient is a 57 year old woman with past medical history of coronary artery disease, hypertension and COPD who presented to [facility] with 5 days of dyspnea. Patient said that he has sick contacts at home and the full illness started about a week ago as a sore throat. Patient started having cough with green colored sputum on Saturday that is 5 days prior to admission. Cough was associated with feeling of chest congestion and shortness of breath which progressively got worse. One day prior to admission patient was seen by her primary care physician who did initial labs and a chest x-ray and patient was advised to come to the ER.  Patient has albuterol inhaler at home which she rarely uses that used it about 3-4  times every day in last 1 week.  Patient already feels better with 1 day of steroids, antibiotics and breathing treatments.  She denies any fever, chest pain, palpitations, chills, change in urinary habits, abdominal pain, headaches, ear pain, change in sleeping pattern.  Review of system is positive for anorexia and loose bowel movements since last 4-5 days.  10 point review of system was performed and was otherwise negative except what is noted above  Physical  Exam:  General:  Vital signs reviewed and noted. Well-developed, well-nourished, in no acute distress; alert, appropriate and cooperative throughout examination.   Lungs:  Normal respiratory effort. Mild, scattered wheezes, good air flow   Heart:  RRR. S1 and S2 normal without gallop, murmur, or rubs.   Abdomen:  BS normoactive. Soft, Nondistended, non-tender. No masses or organomegaly.   Extremities:  No pretibial edema.    HOSPITAL COURSE: Acute COPD exacerbation with acute respiratory failure: Has all 3 symptoms of acute COPD exacerbation: dyspnea, increased sputum production, and sputum purulence. Chest x-ray did not demonstrate a pneumonia; but with her son who has pneumonia and her leukocytosis, so it is possible that pt has a radiographically occult pneumonia. Flu negative. Patient was stable throughout her course. She was started on IV ceftriaxone on admission which was continued until the day of discharge, when she was transitioned to PO doxycycline for additional 5 day course. She was also discharged with a short course of prednisone and instructed to use albuterol PRN and to resume her home dulera. Patient was maintaining saturation levels of 90% walking without oxygen at discharge and did not need home oxygen therapy.  Acute kidney injury and stage III CKD: Baseline GFR is in the range of 50-60; baseline creatinine is around 1.2. At admission, her creatinine was up to 2.32 with a GFR calculated at 22. This was most likely prerenal  from decreased PO intake. FENa = 0.3%. Issue improved after hydration with IVF, and at time of discharge was 1.5.  Hypokalemia: Likely 2/2 multiple factors including recent decreased PO intake, rehydration with NS, and scheduled albuterol nebulizers during her course. Potassium was repleted as needed and the issue was resolved at time of discharge.   Type II diabetes: Slightly elevated CBGs during course, likely 2/2 steroids administered. Metformin held during course and will be restarted at follow up visit if appropriate.  Hyperlipidemia: stable. Will continue home statin at discharge.   Depression, bipolar disorder, and anxiety: Stable. Clonazepam and duloxetine were continued throughout course.   Hypertension: BP stable following admission. Lisinopril was held 2/2 concerns around AKI, and should be restarted at hospital f/u if Cr is wnl for the patient. Metoprolol was continued throughout course.   Coronary artery disease: Stable. Will continue ASA and statin at discharge.   GERD: Stable. Will continue PPI at discharge.    DISCHARGE DATA: Vital Signs: BP 130/68  Pulse 82  Temp 98 F (36.7 C) (Oral)  Resp 20  Ht 5\' 6"  (1.676 m)  Wt 197 lb 11.2 oz (89.676 kg)  BMI 31.91 kg/m2  SpO2 98%  Labs: Results for orders placed during the hospital encounter of 08/03/12 (from the past 24 hour(s))  GLUCOSE, CAPILLARY     Status: Abnormal   Collection Time   08/05/12 11:43 AM      Component Value Range   Glucose-Capillary 119 (*) 70 - 99 mg/dL   Comment 1 Notify RN    GLUCOSE, CAPILLARY     Status: Abnormal   Collection Time   08/05/12  4:29 PM      Component Value Range   Glucose-Capillary 118 (*) 70 - 99 mg/dL   Comment 1 Notify RN    GLUCOSE, CAPILLARY     Status: Abnormal   Collection Time   08/05/12 11:33 PM      Component Value Range   Glucose-Capillary 109 (*) 70 - 99 mg/dL  BASIC METABOLIC PANEL     Status: Abnormal   Collection Time  08/06/12  6:45 AM      Component Value  Range   Sodium 142  135 - 145 mEq/L   Potassium 3.7  3.5 - 5.1 mEq/L   Chloride 103  96 - 112 mEq/L   CO2 30  19 - 32 mEq/L   Glucose, Bld 107 (*) 70 - 99 mg/dL   BUN 24 (*) 6 - 23 mg/dL   Creatinine, Ser 1.61 (*) 0.50 - 1.10 mg/dL   Calcium 8.8  8.4 - 09.6 mg/dL   GFR calc non Af Amer 37 (*) >90 mL/min   GFR calc Af Amer 43 (*) >90 mL/min  CBC     Status: Abnormal   Collection Time   08/06/12  6:45 AM      Component Value Range   WBC 10.4  4.0 - 10.5 K/uL   RBC 3.68 (*) 3.87 - 5.11 MIL/uL   Hemoglobin 11.5 (*) 12.0 - 15.0 g/dL   HCT 04.5  40.9 - 81.1 %   MCV 100.8 (*) 78.0 - 100.0 fL   MCH 31.3  26.0 - 34.0 pg   MCHC 31.0  30.0 - 36.0 g/dL   RDW 91.4  78.2 - 95.6 %   Platelets 268  150 - 400 K/uL  GLUCOSE, CAPILLARY     Status: Abnormal   Collection Time   08/06/12  7:39 AM      Component Value Range   Glucose-Capillary 103 (*) 70 - 99 mg/dL   Comment 1 Notify RN       Time spent on discharge: 38 minutes Services Ordered on Discharge: Y = Yes; Blank = No PT:   OT:   RN:   Equipment:   Other:     Signed: Judie Bonus, MD   PGY 2, Internal Medicine Resident 08/06/2012, 11:08 AM

## 2012-08-06 DIAGNOSIS — F329 Major depressive disorder, single episode, unspecified: Secondary | ICD-10-CM

## 2012-08-06 LAB — BASIC METABOLIC PANEL
BUN: 24 mg/dL — ABNORMAL HIGH (ref 6–23)
CO2: 30 mEq/L (ref 19–32)
Chloride: 103 mEq/L (ref 96–112)
GFR calc Af Amer: 43 mL/min — ABNORMAL LOW (ref >90)
Potassium: 3.7 mEq/L (ref 3.5–5.1)

## 2012-08-06 LAB — CBC
HCT: 37.1 % (ref 36.0–46.0)
Hemoglobin: 11.5 g/dL — ABNORMAL LOW (ref 12.0–15.0)
MCV: 100.8 fL — ABNORMAL HIGH (ref 78.0–100.0)
WBC: 10.4 10*3/uL (ref 4.0–10.5)

## 2012-08-06 LAB — GLUCOSE, CAPILLARY
Glucose-Capillary: 103 mg/dL — ABNORMAL HIGH (ref 70–99)
Glucose-Capillary: 110 mg/dL — ABNORMAL HIGH (ref 70–99)

## 2012-08-06 MED ORDER — ALUM & MAG HYDROXIDE-SIMETH 200-200-20 MG/5ML PO SUSP
15.0000 mL | ORAL | Status: DC | PRN
  Administered 2012-08-06: 15 mL via ORAL
  Filled 2012-08-06: qty 30

## 2012-08-06 MED ORDER — DOXYCYCLINE HYCLATE 100 MG PO TABS: 100.0000 mg | ORAL_TABLET | Freq: Two times a day (BID) | ORAL | Status: DC

## 2012-08-06 MED ORDER — PREDNISONE 20 MG PO TABS: ORAL_TABLET | ORAL | Status: DC

## 2012-08-06 NOTE — Progress Notes (Signed)
Subjective:    Patient is feeling well today and she is not using oxygen anymore. She states that she slept well and had less coughing spells. She was able to be up and walking around yesterday with the oxygen.     Objective:    Vital Signs:   Temp:  [97.9 F (36.6 C)-98.1 F (36.7 C)] 98 F (36.7 C) (01/11 0500) Pulse Rate:  [67-75] 70  (01/11 0500) Resp:  [18-20] 20  (01/11 0500) BP: (119-155)/(73-89) 155/89 mmHg (01/11 0500) SpO2:  [90 %-98 %] 98 % (01/11 0751) Weight:  [197 lb 11.2 oz (89.676 kg)] 197 lb 11.2 oz (89.676 kg) (01/11 0500) Last BM Date: 08/03/12  Intake/Output:   Intake/Output Summary (Last 24 hours) at 08/06/12 1011 Last data filed at 08/06/12 0954  Gross per 24 hour  Intake    472 ml  Output   2050 ml  Net  -1578 ml      Physical Exam: General: Vital signs reviewed and noted. Well-developed, well-nourished, in no acute distress; alert, appropriate and cooperative throughout examination.  Lungs:  Normal respiratory effort. Mild, scattered wheezes, good air flow  Heart: RRR. S1 and S2 normal without gallop, murmur, or rubs.  Abdomen:  BS normoactive. Soft, Nondistended, non-tender.  No masses or organomegaly.  Extremities: No pretibial edema.     Labs:  Basic Metabolic Panel:  Lab 08/06/12 4098 08/05/12 0550 08/04/12 0552 08/03/12 2137 08/03/12 1704  NA 142 143 137 -- 135  K 3.7 3.0* 4.1 -- 2.8*  CL 103 100 92* -- 89*  CO2 30 32 31 -- 31  GLUCOSE 107* 114* 158* -- 124*  BUN 24* 28* 30* -- 26*  CREATININE 1.51* 1.74* 2.50* -- 2.32*  CALCIUM 8.8 9.1 9.9 -- --  MG -- -- -- 1.4* --  PHOS -- -- -- -- --    Liver Function Tests:  Lab 08/03/12 2137  AST 13  ALT 7  ALKPHOS 65  BILITOT 0.5  PROT 7.2  ALBUMIN 3.1*   CBC:  Lab 08/06/12 0645 08/05/12 0550 08/04/12 0552 08/03/12 1704  WBC 10.4 14.7* 12.7* 18.9*  NEUTROABS -- -- -- 14.4*  HGB 11.5* 12.4 13.2 15.1*  HCT 37.1 38.8 42.0 45.0  MCV 100.8* 99.0 98.4 97.0  PLT 268 296 324 395    CBG:  Lab 08/06/12 0739 08/05/12 2333 08/05/12 1629 08/05/12 1143 08/05/12 0730  GLUCAP 103* 109* 118* 119* 99    Microbiology: Results for orders placed in visit on 05/24/12  HELICOBACTER PYLORI SCREEN-BIOPSY     Status: Normal   Collection Time   05/24/12  8:57 AM      Component Value Range Status Comment   UREASE Negative  Negative Final     Imaging: Dg Chest 2 View  08/05/2012  *RADIOLOGY REPORT*  Clinical Data: Cough, shortness of breath, fever, history hypertension, coronary artery disease, diabetes, COPD  CHEST - 2 VIEW  Comparison: 08/03/2012  Findings: Normal heart size, mediastinal contours, and pulmonary vascularity. Mild persistent peribronchial thickening. No pulmonary infiltrate, pleural effusion or pneumothorax. No acute osseous findings. Scattered costicartilages calcifications.  IMPRESSION: Mild chronic bronchitic changes. No acute abnormalities.   Original Report Authenticated By: Ulyses Southward, M.D.        Medications:    Infusions:    Scheduled Medications:    . albuterol  2.5 mg Nebulization Q4H  . antiseptic oral rinse  15 mL Mouth Rinse BID  . aspirin EC  81 mg Oral Daily  . atorvastatin  80 mg Oral q1800  . cefTRIAXone (ROCEPHIN)  IV  1 g Intravenous Q24H  . clonazePAM  0.5 mg Oral TID  . DULoxetine  60 mg Oral Daily  . heparin  5,000 Units Subcutaneous Q8H  . insulin aspart  0-9 Units Subcutaneous TID WC  . ipratropium  0.5 mg Nebulization Q4H  . metoprolol succinate  50 mg Oral Daily  . mometasone-formoterol  2 puff Inhalation BID  . pantoprazole  40 mg Oral Daily  . predniSONE  60 mg Oral Q breakfast  . sodium chloride  3 mL Intravenous Q12H  . triazolam  0.25 mg Oral QHS    PRN Medications: acetaminophen, albuterol, alum & mag hydroxide-simeth, HYDROcodone-acetaminophen, menthol-cetylpyridinium, ondansetron (ZOFRAN) IV, ondansetron, traMADol   Assessment/ Plan:   Acute COPD exacerbation with acute respiratory failure: 3/3 cardinal  symptoms. Will transition to PO antibiotics and give short steroid course for home D/C today. Will walk without oxygen to see if she qualifies for some home O2.   Acute kidney injury and stage III CKD - Cr 1.5 today improving and stable for discharge.  Depression, bipolar disorder, and anxiety -  Stable. At home she takes clonazepam 0.5 mg 3 times a day and duloxetine 60 mg daily.  - Continue clonazepam 0.5 mg 3 times a day  - Continue duloxetine 60 mg daily   Hypertension - stable. Cont to hold lisinopril in setting of AKI and will not restart on D/C. Cont toprol XL 50mg  QD.   Coronary artery disease - Stable. - Continue daily aspirin 81 mg   Type II diabetes - slightly elevated CBGs since admission. Will start SSI while on prednisone.  - cont CBGs - SSI   DVT PPX - heparin  CODE STATUS - full  CONSULTS PLACED - N/A  DISPO - Disposition is deferred at this time, awaiting improvement of COPD exacerbation.   Anticipated discharge in approximately 0 day(s).   The patient does have a current [facility] PCP (AMEEN, Joylene John, MD) and does not need an [facility] hospital follow-up appointment after discharge.    Is the [facility] hospital follow-up appointment a one-time only appointment? not applicable.  Does the patient have transportation limitations that hinder transportation to clinic appointments? no   SERVICE NEEDED AT DISCHARGE - TO BE DETERMINED DURING HOSPITAL COURSE         Y = Yes, Blank = No PT:   OT:   RN:   Equipment:   Other:      Length of Stay: 3 day(s)   Signed: Judie Bonus, MD  PGY-2, Internal Medicine Resident 08/06/2012, 10:11 AM

## 2012-08-10 ENCOUNTER — Ambulatory Visit: Payer: PRIVATE HEALTH INSURANCE | Admitting: Family Medicine

## 2012-08-15 ENCOUNTER — Encounter: Payer: Self-pay | Admitting: Internal Medicine

## 2012-08-25 ENCOUNTER — Telehealth: Payer: Self-pay | Admitting: Internal Medicine

## 2012-08-25 NOTE — Telephone Encounter (Signed)
Discussed with pt that diarrhea was not a usual side effect. Pt states her pharmacist told her the increased dose could have caused the diarrhea. Pt states Dr. Betti Cruz gave her the increased dose at night to help her sleep. Pt will discuss this with Dr. Betti Cruz and see if he may give her something else for sleep.

## 2012-08-25 NOTE — Telephone Encounter (Signed)
I don't know. Look in the PDR

## 2012-08-25 NOTE — Telephone Encounter (Signed)
Pt states that Dr. Betti Cruz increased her Klonopin. She used to just take .5mg  in the am but now she takes 2mg  at bedtime also. Since starting the 2mg  at bedtime pt states she has had diarrhea. Reports that her PCP suggested she take Imodium and follow the brat diet but that has not helped. Pt wants to know if perhaps the Klonopin could be causing the diarrhea. States she has called Dr. Jae Dire office and left a message regarding this but has not heard back yet. Dr. Marina Goodell please advise.

## 2012-08-30 ENCOUNTER — Ambulatory Visit (AMBULATORY_SURGERY_CENTER): Payer: No Typology Code available for payment source

## 2012-08-30 VITALS — Ht 66.0 in | Wt 190.4 lb

## 2012-08-30 DIAGNOSIS — R1013 Epigastric pain: Secondary | ICD-10-CM

## 2012-08-30 DIAGNOSIS — R1310 Dysphagia, unspecified: Secondary | ICD-10-CM

## 2012-09-22 ENCOUNTER — Telehealth: Payer: Self-pay | Admitting: Internal Medicine

## 2012-09-22 ENCOUNTER — Telehealth: Payer: Self-pay | Admitting: *Deleted

## 2012-09-22 NOTE — Telephone Encounter (Signed)
Patient states that she has been having diarrhea for a month.  She is having an EGD tomorrow, and she wants to know what to do about the diarrhea.  She is taking lomotil, and states that it is not working.   She is also very nervous about the propofol due to what happened to [employer]

## 2012-09-22 NOTE — Telephone Encounter (Signed)
Spoke with Dr. Marina Goodell and he stated that she should be seen in the office today by the PA regarding the diarrhea.   Also, she could speak to her about the propofol fear at that time.   I called the patient back, and she will call the front desk and ask for Dr. Lamar Sprinkles nurse to obtain an apointment for today.

## 2012-09-22 NOTE — Telephone Encounter (Signed)
Spoke with Dr. Marina Goodell and he states he will be happy to discuss the diarrhea with pt tomorrow.

## 2012-09-22 NOTE — Telephone Encounter (Signed)
The patient was seen 2 months ago for the same. Had extensive evaluation. I would be happy to speak with her tomorrow at the time of her procedure. Thank you

## 2012-09-22 NOTE — Telephone Encounter (Signed)
Spoke with pt and she states she does not have a ride to come in for a visit today. Pt states she has had the diarrhea for 4 weeks and that it is not anything new. Discussed with pt the propofol and that it is a safe anesthesia and that there is a CRNA present. Also let her know that she will not be as sedated and sleepy and that she will wake up much faster with the propofol. Pt states that she does feel some better regarding the propofol. Pt wants to know if she can just discuss the diarrhea with Dr. Marina Goodell tomorrow when she is here for her procedure. Please advise.

## 2012-09-23 ENCOUNTER — Ambulatory Visit (AMBULATORY_SURGERY_CENTER): Payer: No Typology Code available for payment source | Admitting: Internal Medicine

## 2012-09-23 ENCOUNTER — Encounter: Payer: Self-pay | Admitting: Internal Medicine

## 2012-09-23 ENCOUNTER — Telehealth: Payer: Self-pay

## 2012-09-23 ENCOUNTER — Other Ambulatory Visit: Payer: Self-pay | Admitting: Internal Medicine

## 2012-09-23 VITALS — BP 159/97 | HR 60 | Temp 97.6°F | Resp 31 | Ht 66.0 in | Wt 190.0 lb

## 2012-09-23 DIAGNOSIS — D1330 Benign neoplasm of unspecified part of small intestine: Secondary | ICD-10-CM

## 2012-09-23 LAB — GLUCOSE, CAPILLARY: Glucose-Capillary: 78 mg/dL (ref 70–99)

## 2012-09-23 MED ORDER — METRONIDAZOLE 250 MG PO TABS: 250.0000 mg | ORAL_TABLET | Freq: Four times a day (QID) | ORAL | Status: DC

## 2012-09-23 MED ORDER — DIPHENOXYLATE-ATROPINE 2.5-0.025 MG PO TABS: 1.0000 | ORAL_TABLET | Freq: Four times a day (QID) | ORAL | Status: DC | PRN

## 2012-09-23 MED ORDER — SODIUM CHLORIDE 0.9 % IV SOLN: 500.0000 mL | INTRAVENOUS | Status: DC

## 2012-09-23 NOTE — Op Note (Signed)
[facility] [address] [place], [address]   ENDOSCOPY PROCEDURE REPORT  PATIENT: [name], [name].  MR#: [id number] BIRTHDATE: 1956/03/11 , 56  yrs. old GENDER: Female ENDOSCOPIST: Roxy Cedar, MD REFERRED BY:  Nolon Nations, M.D. PROCEDURE DATE:  09/23/2012 PROCEDURE:  EGD w/ biopsy      and         Maloney dilation of esophagus  -53 French ASA CLASS:     Class III INDICATIONS:  Dysphagia.  Unexplained diarrhea MEDICATIONS: MAC sedation, administered by CRNA and propofol (Diprivan) 120mg  IV TOPICAL ANESTHETIC: none  DESCRIPTION OF PROCEDURE: After the risks benefits and alternatives of the procedure were thoroughly explained, informed consent was obtained.  The LB GIF-H180 [id number] endoscope was introduced through the mouth and advanced to the second portion of the duodenum. Without limitations.  The instrument was slowly withdrawn as the mucosa was fully examined.     EXAM:The esophagus revealed intact mucosa throughout.  There was a distal ring like stricture at the gastroesophageal junction measuring approximately 15 mm.  The stomach was normal except for a sliding small hiatal hernia visualized on retroflexed view.  The duodenal bulb and post bulbar duodenum were normal.  Duodenal biopsies were taken to evaluate chronic diarrhea.  THERAPY: The 75 French Maloney dilator was passed blindly into the esophagus without resistance or heme.  Tolerated well.  COMPLICATIONS: There were no complications. ENDOSCOPIC IMPRESSION: 1. Distal esophageal stricture status post dilation 2. GERD with a history of erosive esophagitis. Healed mucosa on PPI therapy. 3. Chronic diarrhea. Status post duodenal biopsies  RECOMMENDATIONS: 1.  Clear liquids until 2 PM, then soft foods rest of day.  Resume prior diet tomorrow. 2.  Continue current medications, including omeprazole (Prilosec)40 mg twice daily 3. Prescribe metronidazole 250 mg, dispense 40, 1 by  mouth 4 times a day, no refills 4. Please contact the office and make a followup appointment for about 4 weeks  REPEAT EXAM:  eSigned:  Roxy Cedar, MD 09/23/2012 12:14 PM  XB:[id number] Ivory Broad, MD and The Patient

## 2012-09-23 NOTE — Progress Notes (Addendum)
Patient stating she has had diarrhea x 4 weeks. No blood noted in diarrhea, patient does report abdominal cramping. Patient reports 8 to 10 bowel movements per day. Lomotil used with patient stating she will still have 5 to 6 bowel movements per day. Patient stating the diarrhea is greatly affecting her life.  Patient states she had an exacerbation of COPD with hospitalization in January. Patient was given Doxycycline at that time and antibiotics during the hospitalization. Reported to Dr. Marina Goodell who will discuss with patient as promised during telephone note 09/22/12.

## 2012-09-23 NOTE — Patient Instructions (Addendum)
YOU HAD AN ENDOSCOPIC PROCEDURE TODAY AT THE [facility]: Refer to the procedure report that was given to you for any specific questions about what was found during the examination.  If the procedure report does not answer your questions, please call your gastroenterologist to clarify.  If you requested that your care partner not be given the details of your procedure findings, then the procedure report has been included in a sealed envelope for you to review at your convenience later.  YOU SHOULD EXPECT: Some feelings of bloating in the abdomen. Passage of more gas than usual.  Walking can help get rid of the air that was put into your GI tract during the procedure and reduce the bloating. If you had a lower endoscopy (such as a colonoscopy or flexible sigmoidoscopy) you may notice spotting of blood in your stool or on the toilet paper. If you underwent a bowel prep for your procedure, then you may not have a normal bowel movement for a few days.  DIET: Dilation Diet: Clear liquids for the first hour then soft foods for the rest of today. ACTIVITY: Your care partner should take you home directly after the procedure.  You should plan to take it easy, moving slowly for the rest of the day.  You can resume normal activity the day after the procedure however you should NOT DRIVE or use heavy machinery for 24 hours (because of the sedation medicines used during the test).    SYMPTOMS TO REPORT IMMEDIATELY: A gastroenterologist can be reached at any hour.  During normal business hours, 8:30 AM to 5:00 PM Monday through Friday, call [phone].  After hours and on weekends, please call the GI answering service at [phone] who will take a message and have the physician on call contact you.   Following lower endoscopy (colonoscopy or flexible sigmoidoscopy):  Excessive amounts of blood in the stool  Significant tenderness or worsening of abdominal pains  Swelling of the abdomen that is  new, acute  Fever of 100F or higher  Following upper endoscopy (EGD)  Vomiting of blood or coffee ground material  New chest pain or pain under the shoulder blades  Painful or persistently difficult swallowing  New shortness of breath  Fever of 100F or higher  Black, tarry-looking stools  FOLLOW UP: If any biopsies were taken you will be contacted by phone or by letter within the next 1-3 weeks.  Call your gastroenterologist if you have not heard about the biopsies in 3 weeks.  Our staff will call the home number listed on your records the next business day following your procedure to check on you and address any questions or concerns that you may have at that time regarding the information given to you following your procedure. This is a courtesy call and so if there is no answer at the home number and we have not heard from you through the emergency physician on call, we will assume that you have returned to your regular daily activities without incident.  SIGNATURES/CONFIDENTIALITY: You and/or your care partner have signed paperwork which will be entered into your electronic medical record.  These signatures attest to the fact that that the information above on your After Visit Summary has been reviewed and is understood.  Full responsibility of the confidentiality of this discharge information lies with you and/or your care-partner.

## 2012-09-23 NOTE — Progress Notes (Signed)
Called to room to assist during endoscopic procedure.  Patient ID and intended procedure confirmed with present staff. Received instructions for my participation in the procedure from the performing physician. ewm 

## 2012-09-23 NOTE — Telephone Encounter (Signed)
See additional phone note from 09/23/12

## 2012-09-23 NOTE — Telephone Encounter (Signed)
Pt called back and states that the pharmacy said we could call in the lomotil . Medication called in as per dr Marina Goodell to [facility] in [place] as per pt.  Medication called in at 1555 2/28/2-14.  Pt notified medicine called in to the pharmacy. She returned verbal understanding of this. ewm

## 2012-09-23 NOTE — Telephone Encounter (Signed)
The pt called back at 15:30 and said the rx for lomotil was not sent to [facility].  The rx for metronidazole was there.  I asked Doristine Church, RN about this and she said she sent both rx to [facility] in [place].  Marylene Land tried to resent the rx for Lomotil and the computer said it could not be sent electronically, but had to be a written rx.  I called the pt back and gave her this information.  She said no one could pick up the written today.  It would be Monday before anyone could get here.  I apologized to her for the inconvenience.  I advised her the rx would be left at the front desk to be picked up Monday.

## 2012-09-23 NOTE — Progress Notes (Signed)
Patient did not experience any of the following events: a burn prior to discharge; a fall within the facility; wrong site/side/patient/procedure/implant event; or a hospital transfer or hospital admission upon discharge from the facility. (G8907) Patient did not have preoperative order for IV antibiotic SSI prophylaxis. (G8918)  

## 2012-09-26 ENCOUNTER — Other Ambulatory Visit: Payer: Self-pay | Admitting: Internal Medicine

## 2012-09-26 ENCOUNTER — Telehealth: Payer: Self-pay

## 2012-09-26 NOTE — Telephone Encounter (Signed)
The answering machine said to enter my remote access code, which I did not have so I could not leave a message.  The recorder hung up.  The # I called was [phone]. Maw

## 2012-09-27 ENCOUNTER — Encounter: Payer: Self-pay | Admitting: Internal Medicine

## 2012-09-28 ENCOUNTER — Ambulatory Visit: Payer: Self-pay | Admitting: Family Medicine

## 2012-09-29 ENCOUNTER — Ambulatory Visit: Payer: PRIVATE HEALTH INSURANCE | Admitting: Cardiology

## 2012-10-03 ENCOUNTER — Other Ambulatory Visit: Payer: Self-pay | Admitting: Internal Medicine

## 2012-10-04 ENCOUNTER — Telehealth: Payer: Self-pay

## 2012-10-04 NOTE — Telephone Encounter (Signed)
Let patient know that I had called in her rx for Lomotil to her pharmacy.  Patient acknowledged and understood

## 2012-10-04 NOTE — Telephone Encounter (Signed)
Erroneous encounter

## 2012-10-16 ENCOUNTER — Telehealth: Payer: Self-pay | Admitting: Internal Medicine

## 2012-10-16 NOTE — Telephone Encounter (Signed)
Hx of chronic diarrhea, pt of Dr. Marina Goodell Using Lomotil which usually leads to stool 3-4 times per day, without medication 8-12 times per day Now despite Lomotil QID she is having 8-10 stools daily.  Limiting her ability to leave the house, etc. Has some lower abd cramping often relieved by defecation No melena or rectal bleeding.  No known fever.  No vomiting  Plan: extender visit Monday or Tuesday Please call patient tomorrow to schedule OV

## 2012-10-17 ENCOUNTER — Telehealth: Payer: Self-pay | Admitting: Internal Medicine

## 2012-10-17 NOTE — Telephone Encounter (Signed)
See telephone note.

## 2012-10-17 NOTE — Telephone Encounter (Signed)
Pt scheduled to see Mike Gip PA tomorrow at 2:30pm. Pt aware of appt date and time.

## 2012-10-18 ENCOUNTER — Ambulatory Visit: Payer: No Typology Code available for payment source | Admitting: Physician Assistant

## 2012-10-19 ENCOUNTER — Ambulatory Visit: Payer: Self-pay | Admitting: Cardiology

## 2012-10-25 ENCOUNTER — Ambulatory Visit: Payer: No Typology Code available for payment source | Admitting: Internal Medicine

## 2012-10-25 ENCOUNTER — Ambulatory Visit (INDEPENDENT_AMBULATORY_CARE_PROVIDER_SITE_OTHER): Payer: No Typology Code available for payment source | Admitting: Internal Medicine

## 2012-10-25 ENCOUNTER — Encounter: Payer: Self-pay | Admitting: Internal Medicine

## 2012-10-25 VITALS — BP 145/88 | HR 80 | Ht 66.0 in | Wt 189.0 lb

## 2012-10-25 DIAGNOSIS — R197 Diarrhea, unspecified: Secondary | ICD-10-CM

## 2012-10-25 DIAGNOSIS — K219 Gastro-esophageal reflux disease without esophagitis: Secondary | ICD-10-CM

## 2012-10-25 DIAGNOSIS — K222 Esophageal obstruction: Secondary | ICD-10-CM

## 2012-10-25 NOTE — Patient Instructions (Addendum)
Please follow up with Dr. Perry in one year 

## 2012-10-25 NOTE — Progress Notes (Signed)
HISTORY OF PRESENT ILLNESS:  [name] is a 57 y.o. female with multiple significant medical and psychiatric problems as outlined below. She was seen in the office by our extender 07/15/2012 regarding diarrhea. Workup was negative. Subsequently called complaining of reflux and dysphagia for which she underwent upper endoscopy 09/23/2012. She was found to have a distal stricture of the esophagus which was biopsied. She does have a prior history of erosive esophagitis which had healed on PPI. Because of complaints of chronic diarrhea duodenal biopsies were performed. These returned normal. In July 2012 she underwent colonoscopy. This was compromised by poor prep. Subsequent barium enema was negative. The patient contacted the on call Dr. last week complaining of her lab was diarrhea which was limiting her lifestyle. She was given an appointment the following day to see the extender, but canceled stating that her diarrhea resolved. She states that she had been having diarrhea daily for months until one week ago. Since that time she reports perfectly normal bowel habits. She did receive a course of metronidazole first 10 days of March. No GI complaints at this time. Dysphagia has resolved. He is compliant with PPI therapy.  REVIEW OF SYSTEMS:  All non-GI ROS negative except for Sleeping problems, depression, shortness of breath  Past Medical History  Diagnosis Date  . Anemia, unspecified   . Major depression   . Narcotic abuse in remission   . Bipolar disorder, unspecified   . Dissociative identity disorder   . HTN (hypertension), benign     a. meds d/c'd by PCP 11/13 due to low BP in setting of significant weight loss and poor appetite related to esophagitis (Toprol restarted late Nov 2013)  . HLD (hyperlipidemia)   . GERD (gastroesophageal reflux disease)   . CAD (coronary artery disease)     a. Cypher DES to mLAD in 2007; b. MV 3/09: EF 72%, no ischemia/infarction.; c. MV (12/11) EF  73%, ? Ant soft tissue atten, no ischemia/infarction; d. LHC (1/12): patent LAD stent, mild LIs only; e. Lex MV (6/13): no ischemia or infarction.   . Hiatal hernia   . COPD (chronic obstructive pulmonary disease)     a. quit smoking 06/2010; b. PFTs (4/10):  FEV1 66%, ratio 64%, DLCO 67%  . Palpitations     a. Holter (12/11) with occasional PVCs. Holter (11/12): rare PACs and PVCs.  Marland Kitchen Hx of echocardiogram     a. Echo (1/08): EF 65%, no significant valvular abnormalities.; b. Echo (12/11): EF 55-60%, grade II diast dysfn, normal wall motion, mild LAE.; c. Echo (5/13) with EF 55-60%, mild LVH.   Marland Kitchen Esophageal stricture     a. s/p dilatation 7/12;  b. EGD 04/2012 with severe esophagitis and stricture (PPI started and dilatation deferred for several weeks)  . H/O partial nephrectomy 1997  . H/O orthostatic hypotension     probably related to psych meds  . Shortness of breath   . Diabetes mellitus without complication   . Dysphagia     Past Surgical History  Procedure Laterality Date  . Cesarean section    . Bladder surgery      Tack  . Nephrectomy      Part of left kidney  . Carpal tunnel release      BILATERAL HANDS  . Coronary stent placement  2007  . Abdominal hysterectomy      Social History [name]  reports that she quit smoking about 2 years ago. Her smoking use included Cigarettes. She smoked  0.00 packs per day for 40 years. She has never used smokeless tobacco. She reports that she does not drink alcohol or use illicit drugs.  family history includes Emphysema in her mother; Heart attack (age of onset: 38) in her brother and sister; Heart disease in her father; Ovarian cancer in her mother; and Stroke in her maternal grandmother.  There is no history of Colon cancer.  Allergies  Allergen Reactions  . Trazodone Hcl Anaphylaxis  . Cefpodoxime Proxetil Other (See Comments)    unknown  . Erythromycin Ethylsuccinate Nausea And Vomiting  . Perphenazine Other (See  Comments)    unknown       PHYSICAL EXAMINATION: Vital signs: BP 145/88  Pulse 80  Ht 5\' 6"  (1.676 m)  Wt 189 lb (85.73 kg)  BMI 30.52 kg/m2 General: Well-developed, well-nourished, no acute distress HEENT: Sclerae are anicteric, conjunctiva pink. Oral mucosa intact Lungs: Clear Heart: Regular Abdomen: soft, nontender, nondistended, no obvious ascites, no peritoneal signs, normal bowel sounds. No organomegaly. Extremities: No edema Psychiatric: alert and oriented x3. Cooperative   ASSESSMENT:  #1. GERD with a history of erosive esophagitis and peptic stricture. Currently asymptomatic post dilation on PPI #2. Prior complaints of diarrhea of uncertain etiology. Negative workup. Empiric therapies. Resolved as of 1 week ago   PLAN:  #1. Reflux precautions #2. Continue PPI #3. GI followup in 1 year. Resume general care with your primary provider

## 2012-10-26 ENCOUNTER — Other Ambulatory Visit: Payer: Self-pay | Admitting: *Deleted

## 2012-10-26 ENCOUNTER — Ambulatory Visit (INDEPENDENT_AMBULATORY_CARE_PROVIDER_SITE_OTHER): Payer: No Typology Code available for payment source | Admitting: Cardiology

## 2012-10-26 ENCOUNTER — Encounter: Payer: Self-pay | Admitting: Cardiology

## 2012-10-26 VITALS — BP 144/88 | HR 58 | Ht 66.0 in | Wt 190.0 lb

## 2012-10-26 DIAGNOSIS — E785 Hyperlipidemia, unspecified: Secondary | ICD-10-CM

## 2012-10-26 DIAGNOSIS — E876 Hypokalemia: Secondary | ICD-10-CM

## 2012-10-26 DIAGNOSIS — I2510 Atherosclerotic heart disease of native coronary artery without angina pectoris: Secondary | ICD-10-CM

## 2012-10-26 DIAGNOSIS — I10 Essential (primary) hypertension: Secondary | ICD-10-CM

## 2012-10-26 LAB — HEMOGLOBIN A1C: Hgb A1c MFr Bld: 5.6 % (ref 4.6–6.5)

## 2012-10-26 LAB — BASIC METABOLIC PANEL
BUN: 10 mg/dL (ref 6–23)
Calcium: 8.9 mg/dL (ref 8.4–10.5)
GFR: 55.43 mL/min — ABNORMAL LOW (ref >60.00)
Glucose, Bld: 87 mg/dL (ref 70–99)
Potassium: 3 mEq/L — ABNORMAL LOW (ref 3.5–5.1)
Sodium: 141 mEq/L (ref 135–145)

## 2012-10-26 MED ORDER — POTASSIUM CHLORIDE CRYS ER 20 MEQ PO TBCR: EXTENDED_RELEASE_TABLET | ORAL | Status: DC

## 2012-10-26 MED ORDER — POTASSIUM CHLORIDE CRYS ER 20 MEQ PO TBCR: 20.0000 meq | EXTENDED_RELEASE_TABLET | Freq: Every day | ORAL | Status: DC

## 2012-10-26 NOTE — Patient Instructions (Addendum)
Your physician recommends that you have lab today--BMET/HGB A1c  Your physician wants you to follow-up in: 6 months with Dr Shirlee Latch. (October 2014)

## 2012-10-26 NOTE — Progress Notes (Signed)
Patient ID: [name], female   DOB: [birth year], 57 y.o.   MRN: [id number] PCP: Dr. Ivory Broad  57 yo with history of CAD and COPD presents for followup.  Last LHC in 2012 showed patent LAD stent.  Echo in 5/13 showed preserved EF and Lexiscan myoview in 6/13 was normal.  Since I last saw her, she was admitted in 1/14 with COPD exacerbation and AKI (thought to be dehydrated).  ACEI was cut back.  She currently feels well.  She continues to lose weight.  She denies chest pain or significant exertional dyspnea.  She has had trouble in the past with labile BP.  Lately, BP has been running around 140/80 when she checks at home.  Today it is 144/88.    Labs (12/11): BNP 40  Labs (1/12): HCT 38, D dimer negative, LDL 70, HDL 41, creatinine 1.29, TSH  Labs (10/12): TSH normal, LDL 87, HDL 36, K 3.4, creatinine 9.56 Labs (7/13): LDL 251 Labs (9/13): LDL 115, TGs 195, HDL 36, LFTs normal, K 3.3, creatinine 1.2 Labs (11/13); LDL 64, HDL 35 Labs (1/14): K 3.7, creatinine 1.5  ECG: NSR, nonspecific T wave changes  Allergies (verified):  1) ! * Vantin  2) Erythromycin Ethylsuccinate (Erythromycin Ethylsuccinate)  3) Cephalexin (Cephalexin)  4) Trazodone Hcl (Trazodone Hcl)   Past Medical History:  1. 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 DEPRESSION, MAJOR (ICD-296.20)  3. Hx of NARCOTIC ABUSE (ICD-305.90)  4. BIPOLAR DISORDER UNSPECIFIED (ICD-296.80)  5. MULTIPLE PERSONALITY DISORDER (ICD-300.14)  6. HYPERTENSION (ICD-401.9)  7. HYPERLIPIDEMIA (ICD-272.4)  8. G E R D (ICD-530.81) with esophagitis and history of esophageal stricture.  9. C O P D (ICD-496): Quit smoking in 12/11. PFTs (4/10): FEV1 66%, ratio 64%, DLCO 67%.  10. CORONARY HEART DISEASE (ICD-414.00): Cypher DES to mid LAD in 2007. Myoview 3/09 with EF 72%, no ischemia or infarction. Myoview (12/11) EF 73%, suspected anterior soft tissue attenuation with no ischemia or infarction.  Left heart cath (1/12): patent LAD stent, mild LIs  only.  Lexiscan myoview (6/13) showed no ischemia or infarction.  11. Partial nephrectomy 1997  12. Diabetes mellitus  13. Echo (1/08): EF 65%, no significant valvular abnormalities. Echo (12/11): EF 55-60%, grade II diastolic dysfunction, normal wall motion, mild left atrial enlargement.  Echo (5/13) with EF 55-60%, mild LVH.  14. Palpitations: Holter (12/11) with occasional PVCs.  Holter (11/12): rare PACs and PVCs.  15. Esophageal stricture s/p dilatation in 7/12.  16. Obesity.  17. Orthostatic hypotension likely related to psych meds  Family History:  emphysema: mother  asthma: mother  heart disease: father with MI at 103, brother and sister with MIs in their 62s  cancer: mother (ovarian)   Social History:  pt is married with children, lives in [place].  Unemployed  Tobacco Use - Former. 07/07/10  Current Outpatient Prescriptions  Medication Sig Dispense Refill  . aspirin EC 81 MG tablet Take 81 mg by mouth daily.      Marland Kitchen atorvastatin (LIPITOR) 80 MG tablet Take 80 mg by mouth daily.      . cholestyramine (QUESTRAN) 4 G packet       . DULoxetine (CYMBALTA) 60 MG capsule Take 60 mg by mouth daily.       Marland Kitchen lisinopril (PRINIVIL,ZESTRIL) 5 MG tablet       . metoprolol succinate (TOPROL-XL) 50 MG 24 hr tablet Take 50 mg by mouth daily. Take with or immediately following a meal.      . mometasone-formoterol (DULERA) 200-5  MCG/ACT AERO Inhale 2 puffs into the lungs 2 (two) times daily.      Marland Kitchen omeprazole (PRILOSEC) 40 MG capsule Take 40 mg by mouth 2 (two) times daily.      . rosuvastatin (CRESTOR) 40 MG tablet Take 1 tablet (40 mg total) by mouth daily.  30 tablet  3   No current facility-administered medications for this visit.    BP 144/88  Pulse 58  Ht 5\' 6"  (1.676 m)  Wt 190 lb (86.183 kg)  BMI 30.68 kg/m2  SpO2 94% General: NAD, obese.  Neck: Thick neck, no JVD, no thyromegaly or thyroid nodule.  Lungs: Bilateral wheezing CV: Nondisplaced PMI.  Heart regular S1/S2, no  S3/S4, no murmur.  Trace ankle edema.  No carotid bruit.  Normal pedal pulses.  Abdomen: Soft, nontender, no hepatosplenomegaly, no distention.  Neurologic: Alert and oriented x 3.  Psych: Normal affect. Extremities: No clubbing or cyanosis.   Assessment/Plan: 1. CAD: No recent chest pain.  She had a negative myoview in 6/13.  Continue ASA, statin, ACEI, beta blocker.   2. Hyperlipidemia: Continue statin, good lipids in 11/13.   3. HTN: History of labile BP.  Recently, BP has been running around 140/80.  This is not ideal but I will not change it at this time.  She is back on a lower dose of lisinopril after it was stopped in 1/14 for AKI.  I will repeat a BMET today.  4. Obesity: Continue efforts at weight loss.  She has made good progress.   Marca Ancona 10/26/2012

## 2012-11-06 ENCOUNTER — Emergency Department (HOSPITAL_COMMUNITY)
Admission: EM | Admit: 2012-11-06 | Discharge: 2012-11-07 | Disposition: A | Discharge status: Home / Self Care | Payer: No Typology Code available for payment source | Attending: Emergency Medicine | Admitting: Emergency Medicine

## 2012-11-06 ENCOUNTER — Encounter (HOSPITAL_COMMUNITY): Payer: Self-pay | Admitting: Family Medicine

## 2012-11-06 ENCOUNTER — Emergency Department (HOSPITAL_COMMUNITY): Payer: No Typology Code available for payment source

## 2012-11-06 DIAGNOSIS — M545 Low back pain, unspecified: Secondary | ICD-10-CM | POA: Insufficient documentation

## 2012-11-06 DIAGNOSIS — J449 Chronic obstructive pulmonary disease, unspecified: Secondary | ICD-10-CM | POA: Insufficient documentation

## 2012-11-06 DIAGNOSIS — I10 Essential (primary) hypertension: Secondary | ICD-10-CM | POA: Insufficient documentation

## 2012-11-06 DIAGNOSIS — F4481 Dissociative identity disorder: Secondary | ICD-10-CM | POA: Insufficient documentation

## 2012-11-06 DIAGNOSIS — Z9089 Acquired absence of other organs: Secondary | ICD-10-CM | POA: Insufficient documentation

## 2012-11-06 DIAGNOSIS — Z79899 Other long term (current) drug therapy: Secondary | ICD-10-CM | POA: Insufficient documentation

## 2012-11-06 DIAGNOSIS — Z9861 Coronary angioplasty status: Secondary | ICD-10-CM | POA: Insufficient documentation

## 2012-11-06 DIAGNOSIS — Z862 Personal history of diseases of the blood and blood-forming organs and certain disorders involving the immune mechanism: Secondary | ICD-10-CM | POA: Insufficient documentation

## 2012-11-06 DIAGNOSIS — J4489 Other specified chronic obstructive pulmonary disease: Secondary | ICD-10-CM | POA: Insufficient documentation

## 2012-11-06 DIAGNOSIS — Z90710 Acquired absence of both cervix and uterus: Secondary | ICD-10-CM | POA: Insufficient documentation

## 2012-11-06 DIAGNOSIS — Z8679 Personal history of other diseases of the circulatory system: Secondary | ICD-10-CM | POA: Insufficient documentation

## 2012-11-06 DIAGNOSIS — E785 Hyperlipidemia, unspecified: Secondary | ICD-10-CM | POA: Insufficient documentation

## 2012-11-06 DIAGNOSIS — I2510 Atherosclerotic heart disease of native coronary artery without angina pectoris: Secondary | ICD-10-CM | POA: Insufficient documentation

## 2012-11-06 DIAGNOSIS — K219 Gastro-esophageal reflux disease without esophagitis: Secondary | ICD-10-CM | POA: Insufficient documentation

## 2012-11-06 DIAGNOSIS — Z87891 Personal history of nicotine dependence: Secondary | ICD-10-CM | POA: Insufficient documentation

## 2012-11-06 DIAGNOSIS — Z7982 Long term (current) use of aspirin: Secondary | ICD-10-CM | POA: Insufficient documentation

## 2012-11-06 DIAGNOSIS — Z8719 Personal history of other diseases of the digestive system: Secondary | ICD-10-CM | POA: Insufficient documentation

## 2012-11-06 DIAGNOSIS — F319 Bipolar disorder, unspecified: Secondary | ICD-10-CM | POA: Insufficient documentation

## 2012-11-06 DIAGNOSIS — E119 Type 2 diabetes mellitus without complications: Secondary | ICD-10-CM | POA: Insufficient documentation

## 2012-11-06 LAB — URINALYSIS, ROUTINE W REFLEX MICROSCOPIC
Bilirubin Urine: NEGATIVE
Leukocytes, UA: NEGATIVE
Nitrite: NEGATIVE
Specific Gravity, Urine: 1.01 (ref 1.005–1.030)
Urobilinogen, UA: 0.2 mg/dL (ref 0.0–1.0)
pH: 5.5 (ref 5.0–8.0)

## 2012-11-06 LAB — COMPREHENSIVE METABOLIC PANEL
ALT: 11 U/L (ref 0–35)
AST: 16 U/L (ref 0–37)
CO2: 30 mEq/L (ref 19–32)
Calcium: 9.2 mg/dL (ref 8.4–10.5)
Chloride: 101 mEq/L (ref 96–112)
Creatinine, Ser: 1.15 mg/dL — ABNORMAL HIGH (ref 0.50–1.10)
GFR calc Af Amer: 60 mL/min — ABNORMAL LOW (ref >90)
GFR calc non Af Amer: 52 mL/min — ABNORMAL LOW (ref >90)
Glucose, Bld: 79 mg/dL (ref 70–99)
Sodium: 140 mEq/L (ref 135–145)
Total Bilirubin: 0.3 mg/dL (ref 0.3–1.2)

## 2012-11-06 LAB — CBC WITH DIFFERENTIAL/PLATELET
Basophils Absolute: 0 10*3/uL (ref 0.0–0.1)
Basophils Relative: 0 % (ref 0–1)
Eosinophils Absolute: 0.4 10*3/uL (ref 0.0–0.7)
Hemoglobin: 13.8 g/dL (ref 12.0–15.0)
MCH: 33.6 pg (ref 26.0–34.0)
MCHC: 34.5 g/dL (ref 30.0–36.0)
Neutro Abs: 5.6 10*3/uL (ref 1.7–7.7)
Neutrophils Relative %: 51 % (ref 43–77)
Platelets: 236 10*3/uL (ref 150–400)

## 2012-11-06 LAB — LIPASE, BLOOD: Lipase: 79 U/L — ABNORMAL HIGH (ref 11–59)

## 2012-11-06 MED ORDER — CYCLOBENZAPRINE HCL 5 MG PO TABS: 5.0000 mg | ORAL_TABLET | Freq: Two times a day (BID) | ORAL | Status: DC | PRN

## 2012-11-06 MED ORDER — ONDANSETRON HCL 4 MG/2ML IJ SOLN
4.0000 mg | Freq: Once | INTRAMUSCULAR | Status: AC
  Administered 2012-11-06: 4 mg via INTRAVENOUS
  Filled 2012-11-06: qty 2

## 2012-11-06 MED ORDER — FENTANYL CITRATE 0.05 MG/ML IJ SOLN: 50.0000 ug | Freq: Once | INTRAMUSCULAR | Status: DC

## 2012-11-06 MED ORDER — TRAMADOL HCL 50 MG PO TABS: 50.0000 mg | ORAL_TABLET | Freq: Four times a day (QID) | ORAL | Status: DC | PRN

## 2012-11-06 MED ORDER — MORPHINE SULFATE 4 MG/ML IJ SOLN
4.0000 mg | Freq: Once | INTRAMUSCULAR | Status: AC | PRN
  Administered 2012-11-06: 4 mg via INTRAVENOUS
  Filled 2012-11-06: qty 1

## 2012-11-06 MED ORDER — SODIUM CHLORIDE 0.9 % IV SOLN
Freq: Once | INTRAVENOUS | Status: AC
  Administered 2012-11-06: 21:00:00 via INTRAVENOUS

## 2012-11-06 MED ORDER — MORPHINE SULFATE 4 MG/ML IJ SOLN
4.0000 mg | Freq: Once | INTRAMUSCULAR | Status: AC
  Administered 2012-11-06: 4 mg via INTRAVENOUS
  Filled 2012-11-06: qty 1

## 2012-11-06 NOTE — ED Notes (Signed)
Per pt sts left flank pain. Denies urinary symptoms. sts hx of the same.

## 2012-11-06 NOTE — ED Provider Notes (Signed)
History     CSN: [id number]  Arrival date & time 11/06/12  1745   First MD Initiated Contact with Patient 11/06/12 1917      Chief Complaint  Patient presents with  . Flank Pain    (Consider location/radiation/quality/duration/timing/severity/associated sxs/prior treatment) HPI  Patient presents to the ED complaint of left flank pain that started since yesterday. She says that it is constant but waxes and wanes. It shoots down towards her groin and down her but soft. Position that she is and does not alter the pain. She denies recent injury, nausea, vomiting or diarrhea. She has a hx of kidney stone and says this feels like the same. nad vss  PMH is positive for anemia, major depression, bipolar disorder, narcotic abuse, COPD, CAD, HTN, Diabetes.  Past Medical History  Diagnosis Date  . Anemia, unspecified   . Major depression   . Narcotic abuse in remission   . Bipolar disorder, unspecified   . Dissociative identity disorder   . HTN (hypertension), benign     a. meds d/c'd by PCP 11/13 due to low BP in setting of significant weight loss and poor appetite related to esophagitis (Toprol restarted late Nov 2013)  . HLD (hyperlipidemia)   . GERD (gastroesophageal reflux disease)   . CAD (coronary artery disease)     a. Cypher DES to mLAD in 2007; b. MV 3/09: EF 72%, no ischemia/infarction.; c. MV (12/11) EF 73%, ? Ant soft tissue atten, no ischemia/infarction; d. LHC (1/12): patent LAD stent, mild LIs only; e. Lex MV (6/13): no ischemia or infarction.   . Hiatal hernia   . COPD (chronic obstructive pulmonary disease)     a. quit smoking 06/2010; b. PFTs (4/10):  FEV1 66%, ratio 64%, DLCO 67%  . Palpitations     a. Holter (12/11) with occasional PVCs. Holter (11/12): rare PACs and PVCs.  Marland Kitchen Hx of echocardiogram     a. Echo (1/08): EF 65%, no significant valvular abnormalities.; b. Echo (12/11): EF 55-60%, grade II diast dysfn, normal wall motion, mild LAE.; c. Echo (5/13) with EF  55-60%, mild LVH.   Marland Kitchen Esophageal stricture     a. s/p dilatation 7/12;  b. EGD 04/2012 with severe esophagitis and stricture (PPI started and dilatation deferred for several weeks)  . H/O partial nephrectomy 1997  . H/O orthostatic hypotension     probably related to psych meds  . Shortness of breath   . Diabetes mellitus without complication   . Dysphagia     Past Surgical History  Procedure Laterality Date  . Cesarean section    . Bladder surgery      Tack  . Nephrectomy      Part of left kidney  . Carpal tunnel release      BILATERAL HANDS  . Coronary stent placement  2007  . Abdominal hysterectomy      Family History  Problem Relation Age of Onset  . Emphysema Mother   . Ovarian cancer Mother   . Heart disease Father     mi age 30  . Heart attack Sister 30  . Heart attack Brother 30  . Colon cancer Neg Hx   . Stroke Maternal Grandmother     History  Substance Use Topics  . Smoking status: Former Smoker -- 40 years    Types: Cigarettes    Quit date: 07/07/2010  . Smokeless tobacco: Never Used     Comment: Pt states she does smoke an electronic cigarette.  Marland Kitchen  Alcohol Use: No    OB History   Grav Para Term Preterm Abortions TAB SAB Ect Mult Living                  Review of Systems  All other systems reviewed and are negative.    Allergies  Trazodone hcl; Cefpodoxime proxetil; Erythromycin ethylsuccinate; and Perphenazine  Home Medications   Current Outpatient Rx  Name  Route  Sig  Dispense  Refill  . albuterol (PROVENTIL HFA;VENTOLIN HFA) 108 (90 BASE) MCG/ACT inhaler   Inhalation   Inhale 2 puffs into the lungs every 6 (six) hours.         Marland Kitchen aspirin EC 81 MG tablet   Oral   Take 81 mg by mouth daily.         Marland Kitchen atorvastatin (LIPITOR) 80 MG tablet   Oral   Take 80 mg by mouth daily.         . clonazePAM (KLONOPIN) 1 MG tablet   Oral   Take 1 mg by mouth 2 (two) times daily.         . clonazePAM (KLONOPIN) 2 MG tablet   Oral    Take 2 mg by mouth at bedtime.         . DULoxetine (CYMBALTA) 60 MG capsule   Oral   Take 60 mg by mouth daily.          . fluPHENAZine (PROLIXIN) 1 MG tablet   Oral   Take 1-2 mg by mouth 2 (two) times daily. Takes 1 tablet in the morning and 2 tablets at bedtime         . metoprolol succinate (TOPROL-XL) 50 MG 24 hr tablet   Oral   Take 50 mg by mouth daily. Take with or immediately following a meal.         . mometasone-formoterol (DULERA) 200-5 MCG/ACT AERO   Inhalation   Inhale 2 puffs into the lungs 2 (two) times daily.         Marland Kitchen omeprazole (PRILOSEC) 40 MG capsule   Oral   Take 40 mg by mouth 2 (two) times daily.         . cyclobenzaprine (FLEXERIL) 5 MG tablet   Oral   Take 1 tablet (5 mg total) by mouth 2 (two) times daily as needed for muscle spasms.   12 tablet   0   . traMADol (ULTRAM) 50 MG tablet   Oral   Take 1 tablet (50 mg total) by mouth every 6 (six) hours as needed for pain.   15 tablet   0     BP 176/99  Pulse 61  Temp(Src) 98.5 F (36.9 C)  Resp 18  SpO2 95%  Physical Exam  Nursing note and vitals reviewed. Constitutional: She appears well-developed and well-nourished. No distress.  HENT:  Head: Normocephalic and atraumatic.  Eyes: Pupils are equal, round, and reactive to light.  Neck: Normal range of motion. Neck supple.  Cardiovascular: Normal rate and regular rhythm.   Pulmonary/Chest: Effort normal.  Abdominal: Soft. There is tenderness (left flank). There is CVA tenderness. There is no rigidity, no rebound, no guarding, no tenderness at McBurney's point and negative Murphy's sign.  Difficult to assess abdomen due to patients body habitus  Neurological: She is alert.  Skin: Skin is warm and dry.    ED Course  Procedures (including critical care time)  Labs Reviewed  COMPREHENSIVE METABOLIC PANEL - Abnormal; Notable for the following:    Creatinine,  Ser 1.15 (*)    Total Protein 5.8 (*)    Albumin 3.1 (*)    GFR  calc non Af Amer 52 (*)    GFR calc Af Amer 60 (*)    All other components within normal limits  CBC WITH DIFFERENTIAL - Abnormal; Notable for the following:    WBC 10.9 (*)    Lymphs Abs 4.2 (*)    All other components within normal limits  LIPASE, BLOOD - Abnormal; Notable for the following:    Lipase 79 (*)    All other components within normal limits  URINALYSIS, ROUTINE W REFLEX MICROSCOPIC   Ct Abdomen Pelvis Wo Contrast  11/06/2012  *RADIOLOGY REPORT*  Clinical Data: Left flank pain.  History kidney stones.  CT ABDOMEN AND PELVIS WITHOUT CONTRAST  Technique:  Multidetector CT imaging of the abdomen and pelvis was performed following the standard protocol without intravenous contrast.  Comparison: CT scan dated 03/26/2009  Findings: There are numerous nodular densities in both kidneys most likely numerous cysts, markedly increased in number since the prior exam.  There are a few tiny calcifications in both kidneys.  No hydronephrosis.  No ureteral calculi or bladder calculi.  The patient has developed unusual atrophy of the posterior aspect of the right lobe of the liver since the prior exam.  Biliary tree appears normal.  Spleen and pancreas and adrenal glands are normal.  The bowel is normal.  Uterus and ovaries appear to have been removed.  No acute osseous abnormality.  IMPRESSION: No acute abnormalities.  Marked increase in bilateral renal cysts. Small bilateral renal stones.   Original Report Authenticated By: Francene Boyers, M.D.      1. Low back pain       MDM  Work-up is unremarkable. Pain has compeltely resolved with pain medication. She has follow-up with her PCP.   Diagnosis back pain. Rx: flexeril and Vicodin   Pt has been advised of the symptoms that warrant their return to the ED. Patient has voiced understanding and has agreed to follow-up with the PCP or specialist.        Dorthula Matas, PA-C 11/06/12 2308

## 2012-11-08 NOTE — ED Provider Notes (Signed)
Medical screening examination/treatment/procedure(s) were performed by non-physician practitioner and as supervising physician I was immediately available for consultation/collaboration.    Shelda Jakes, MD 11/08/12 1325

## 2012-11-15 ENCOUNTER — Ambulatory Visit: Payer: Self-pay | Admitting: Family Medicine

## 2012-12-15 ENCOUNTER — Telehealth: Payer: Self-pay | Admitting: Internal Medicine

## 2012-12-15 MED ORDER — DIPHENOXYLATE-ATROPINE 2.5-0.025 MG PO TABS: 1.0000 | ORAL_TABLET | Freq: Four times a day (QID) | ORAL | Status: DC | PRN

## 2012-12-15 NOTE — Telephone Encounter (Signed)
Her history is a bit odd. She can have one prescription for Lomotil. Any further problems, have her se an extender

## 2012-12-15 NOTE — Telephone Encounter (Signed)
Pt aware and script sent to the pharmacy. 

## 2012-12-15 NOTE — Telephone Encounter (Signed)
Pt states she has had diarrhea now for 4 days. States she spoke with her PCP and was told to take Imodium but reports this has not worked. Pt states she has not taken any antibiotics recently. Pt states this happened once before and she was seen but it just stopped on its own. Pt states she was given Lomotil by Dr. Marina Goodell and is requesting another prescription of the Lomotil. Dr. Marina Goodell please advise.

## 2012-12-22 ENCOUNTER — Encounter (HOSPITAL_BASED_OUTPATIENT_CLINIC_OR_DEPARTMENT_OTHER): Payer: Self-pay

## 2012-12-22 ENCOUNTER — Emergency Department (HOSPITAL_BASED_OUTPATIENT_CLINIC_OR_DEPARTMENT_OTHER)
Admission: EM | Admit: 2012-12-22 | Discharge: 2012-12-22 | Disposition: A | Discharge status: Home / Self Care | Payer: No Typology Code available for payment source | Attending: Emergency Medicine | Admitting: Emergency Medicine

## 2012-12-22 DIAGNOSIS — E785 Hyperlipidemia, unspecified: Secondary | ICD-10-CM | POA: Insufficient documentation

## 2012-12-22 DIAGNOSIS — Z79899 Other long term (current) drug therapy: Secondary | ICD-10-CM | POA: Insufficient documentation

## 2012-12-22 DIAGNOSIS — J449 Chronic obstructive pulmonary disease, unspecified: Secondary | ICD-10-CM | POA: Insufficient documentation

## 2012-12-22 DIAGNOSIS — R51 Headache: Secondary | ICD-10-CM | POA: Insufficient documentation

## 2012-12-22 DIAGNOSIS — Z9889 Other specified postprocedural states: Secondary | ICD-10-CM | POA: Insufficient documentation

## 2012-12-22 DIAGNOSIS — F319 Bipolar disorder, unspecified: Secondary | ICD-10-CM | POA: Insufficient documentation

## 2012-12-22 DIAGNOSIS — Z87891 Personal history of nicotine dependence: Secondary | ICD-10-CM | POA: Insufficient documentation

## 2012-12-22 DIAGNOSIS — Z9861 Coronary angioplasty status: Secondary | ICD-10-CM | POA: Insufficient documentation

## 2012-12-22 DIAGNOSIS — I2510 Atherosclerotic heart disease of native coronary artery without angina pectoris: Secondary | ICD-10-CM | POA: Insufficient documentation

## 2012-12-22 DIAGNOSIS — Z8719 Personal history of other diseases of the digestive system: Secondary | ICD-10-CM | POA: Insufficient documentation

## 2012-12-22 DIAGNOSIS — I10 Essential (primary) hypertension: Secondary | ICD-10-CM | POA: Insufficient documentation

## 2012-12-22 DIAGNOSIS — Z862 Personal history of diseases of the blood and blood-forming organs and certain disorders involving the immune mechanism: Secondary | ICD-10-CM | POA: Insufficient documentation

## 2012-12-22 DIAGNOSIS — H53149 Visual discomfort, unspecified: Secondary | ICD-10-CM | POA: Insufficient documentation

## 2012-12-22 DIAGNOSIS — Z7982 Long term (current) use of aspirin: Secondary | ICD-10-CM | POA: Insufficient documentation

## 2012-12-22 DIAGNOSIS — R110 Nausea: Secondary | ICD-10-CM | POA: Insufficient documentation

## 2012-12-22 DIAGNOSIS — J4489 Other specified chronic obstructive pulmonary disease: Secondary | ICD-10-CM | POA: Insufficient documentation

## 2012-12-22 DIAGNOSIS — Z8679 Personal history of other diseases of the circulatory system: Secondary | ICD-10-CM | POA: Insufficient documentation

## 2012-12-22 DIAGNOSIS — K219 Gastro-esophageal reflux disease without esophagitis: Secondary | ICD-10-CM | POA: Insufficient documentation

## 2012-12-22 MED ORDER — SODIUM CHLORIDE 0.9 % IV BOLUS (SEPSIS)
1000.0000 mL | Freq: Once | INTRAVENOUS | Status: AC
  Administered 2012-12-22: 1000 mL via INTRAVENOUS

## 2012-12-22 MED ORDER — HYDROMORPHONE HCL PF 1 MG/ML IJ SOLN
1.0000 mg | Freq: Once | INTRAMUSCULAR | Status: AC
  Administered 2012-12-22: 1 mg via INTRAVENOUS
  Filled 2012-12-22: qty 1

## 2012-12-22 MED ORDER — KETOROLAC TROMETHAMINE 30 MG/ML IJ SOLN
30.0000 mg | Freq: Once | INTRAMUSCULAR | Status: AC
  Administered 2012-12-22: 30 mg via INTRAVENOUS
  Filled 2012-12-22: qty 1

## 2012-12-22 MED ORDER — METOCLOPRAMIDE HCL 5 MG/ML IJ SOLN
10.0000 mg | Freq: Once | INTRAMUSCULAR | Status: AC
Start: 2012-12-22 — End: 2012-12-22
  Administered 2012-12-22: 10 mg via INTRAVENOUS
  Filled 2012-12-22: qty 2

## 2012-12-22 NOTE — ED Provider Notes (Signed)
History     CSN: [id number]  Arrival date & time 12/22/12  1947   First MD Initiated Contact with Patient 12/22/12 2057      Chief Complaint  Patient presents with  . Hypertension  . Headache    (Consider location/radiation/quality/duration/timing/severity/associated sxs/prior treatment) Patient is a 57 y.o. female presenting with hypertension and headaches.  Hypertension Associated symptoms include headaches.  Headache  Pt with multiple medical and psychiatric problems reports diffuse throbbing headache ongoing for the last 3 weeks, associated with photophobia and nausea. She attributes her HA to labile BP recently, although her symptoms to not correlate to BP readings. She has been seen in her PCP office several times recently and difficulty managing her BP due to interactions with her psychiatric meds.   Past Medical History  Diagnosis Date  . Anemia, unspecified   . Major depression   . Narcotic abuse in remission   . Bipolar disorder, unspecified   . Dissociative identity disorder   . HTN (hypertension), benign     a. meds d/c'd by PCP 11/13 due to low BP in setting of significant weight loss and poor appetite related to esophagitis (Toprol restarted late Nov 2013)  . HLD (hyperlipidemia)   . GERD (gastroesophageal reflux disease)   . CAD (coronary artery disease)     a. Cypher DES to mLAD in 2007; b. MV 3/09: EF 72%, no ischemia/infarction.; c. MV (12/11) EF 73%, ? Ant soft tissue atten, no ischemia/infarction; d. LHC (1/12): patent LAD stent, mild LIs only; e. Lex MV (6/13): no ischemia or infarction.   . Hiatal hernia   . COPD (chronic obstructive pulmonary disease)     a. quit smoking 06/2010; b. PFTs (4/10):  FEV1 66%, ratio 64%, DLCO 67%  . Palpitations     a. Holter (12/11) with occasional PVCs. Holter (11/12): rare PACs and PVCs.  Marland Kitchen Hx of echocardiogram     a. Echo (1/08): EF 65%, no significant valvular abnormalities.; b. Echo (12/11): EF 55-60%, grade II diast  dysfn, normal wall motion, mild LAE.; c. Echo (5/13) with EF 55-60%, mild LVH.   Marland Kitchen Esophageal stricture     a. s/p dilatation 7/12;  b. EGD 04/2012 with severe esophagitis and stricture (PPI started and dilatation deferred for several weeks)  . H/O partial nephrectomy 1997  . H/O orthostatic hypotension     probably related to psych meds  . Shortness of breath   . Dysphagia     Past Surgical History  Procedure Laterality Date  . Cesarean section    . Bladder surgery      Tack  . Nephrectomy      Part of left kidney  . Carpal tunnel release      BILATERAL HANDS  . Coronary stent placement  2007  . Abdominal hysterectomy      Family History  Problem Relation Age of Onset  . Emphysema Mother   . Ovarian cancer Mother   . Heart disease Father     mi age 45  . Heart attack Sister 30  . Heart attack Brother 30  . Colon cancer Neg Hx   . Stroke Maternal Grandmother     History  Substance Use Topics  . Smoking status: Former Smoker -- 40 years    Types: Cigarettes    Quit date: 07/07/2010  . Smokeless tobacco: Never Used     Comment: Pt states she does smoke an electronic cigarette.  . Alcohol Use: No    OB History  Grav Para Term Preterm Abortions TAB SAB Ect Mult Living                  Review of Systems  Neurological: Positive for headaches.   All other systems reviewed and are negative except as noted in HPI.   Allergies  Trazodone hcl; Cefpodoxime proxetil; Erythromycin ethylsuccinate; and Perphenazine  Home Medications   Current Outpatient Rx  Name  Route  Sig  Dispense  Refill  . UNKNOWN TO PATIENT      New BP med started yesterday         . Zolpidem Tartrate (AMBIEN PO)   Oral   Take by mouth.         Marland Kitchen albuterol (PROVENTIL HFA;VENTOLIN HFA) 108 (90 BASE) MCG/ACT inhaler   Inhalation   Inhale 2 puffs into the lungs every 6 (six) hours.         Marland Kitchen aspirin EC 81 MG tablet   Oral   Take 81 mg by mouth daily.         Marland Kitchen atorvastatin  (LIPITOR) 80 MG tablet   Oral   Take 80 mg by mouth daily.         . clonazePAM (KLONOPIN) 1 MG tablet   Oral   Take 1 mg by mouth 2 (two) times daily.         . clonazePAM (KLONOPIN) 2 MG tablet   Oral   Take 2 mg by mouth at bedtime.         . cyclobenzaprine (FLEXERIL) 5 MG tablet   Oral   Take 1 tablet (5 mg total) by mouth 2 (two) times daily as needed for muscle spasms.   12 tablet   0   . diphenoxylate-atropine (LOMOTIL) 2.5-0.025 MG per tablet   Oral   Take 1 tablet by mouth 4 (four) times daily as needed for diarrhea or loose stools.   30 tablet   0   . DULoxetine (CYMBALTA) 60 MG capsule   Oral   Take 60 mg by mouth daily.          . fluPHENAZine (PROLIXIN) 1 MG tablet   Oral   Take 1-2 mg by mouth 2 (two) times daily. Takes 1 tablet in the morning and 2 tablets at bedtime         . metoprolol succinate (TOPROL-XL) 50 MG 24 hr tablet   Oral   Take 50 mg by mouth daily. Take with or immediately following a meal.         . mometasone-formoterol (DULERA) 200-5 MCG/ACT AERO   Inhalation   Inhale 2 puffs into the lungs 2 (two) times daily.         Marland Kitchen omeprazole (PRILOSEC) 40 MG capsule   Oral   Take 40 mg by mouth 2 (two) times daily.         . traMADol (ULTRAM) 50 MG tablet   Oral   Take 1 tablet (50 mg total) by mouth every 6 (six) hours as needed for pain.   15 tablet   0     BP 148/86  Pulse 74  Temp(Src) 98.1 F (36.7 C) (Oral)  Resp 16  Ht 5\' 6"  (1.676 m)  Wt 190 lb (86.183 kg)  BMI 30.68 kg/m2  SpO2 91%  Physical Exam  Nursing note and vitals reviewed. Constitutional: She is oriented to person, place, and time. She appears well-developed and well-nourished.  HENT:  Head: Normocephalic and atraumatic.  Eyes: EOM are normal. Pupils  are equal, round, and reactive to light.  Neck: Normal range of motion. Neck supple.  Cardiovascular: Normal rate, normal heart sounds and intact distal pulses.   Pulmonary/Chest: Effort normal  and breath sounds normal.  Abdominal: Bowel sounds are normal. She exhibits no distension. There is no tenderness.  Musculoskeletal: Normal range of motion. She exhibits no edema and no tenderness.  Neurological: She is alert and oriented to person, place, and time. She has normal strength. No cranial nerve deficit or sensory deficit.  Skin: Skin is warm and dry. No rash noted.  Psychiatric: She has a normal mood and affect.    ED Course  Procedures (including critical care time)  Labs Reviewed - No data to display No results found.   1. Headache       MDM  BP here is not very high, but complaining of severe headache. Not improved with toradol/reglan. Will give dilaudid.   11:39 PM Headache improved, ready for discharge. Advised neurology follow up for further evaluation of her chronic headache.        Aiyannah Fayad B. Bernette Mayers, MD 12/22/12 2340

## 2012-12-22 NOTE — ED Notes (Signed)
HA x "several weeks"-was seen yesterday PCP yesterday for HA-new BP med added

## 2012-12-27 ENCOUNTER — Telehealth: Payer: Self-pay | Admitting: Internal Medicine

## 2012-12-27 NOTE — Telephone Encounter (Signed)
Pt states her psychiatrist started her on prolixin for hallucinations and it has caused her to have headaches and spikes in her BP. Pt states she is now having terrible epigastric pain. Pt requesting to be seen. Pt scheduled to see Doug Sou PA tomorrow at 1:30pm. Pt aware of appt date and time.

## 2012-12-28 ENCOUNTER — Ambulatory Visit: Payer: No Typology Code available for payment source | Admitting: Gastroenterology

## 2012-12-29 ENCOUNTER — Ambulatory Visit: Payer: No Typology Code available for payment source | Admitting: Physician Assistant

## 2012-12-31 DIAGNOSIS — M545 Low back pain, unspecified: Secondary | ICD-10-CM | POA: Insufficient documentation

## 2012-12-31 DIAGNOSIS — G44209 Tension-type headache, unspecified, not intractable: Secondary | ICD-10-CM | POA: Insufficient documentation

## 2012-12-31 DIAGNOSIS — B349 Viral infection, unspecified: Secondary | ICD-10-CM | POA: Insufficient documentation

## 2012-12-31 DIAGNOSIS — T50905A Adverse effect of unspecified drugs, medicaments and biological substances, initial encounter: Secondary | ICD-10-CM | POA: Insufficient documentation

## 2012-12-31 DIAGNOSIS — L080 Pyoderma: Secondary | ICD-10-CM | POA: Insufficient documentation

## 2012-12-31 DIAGNOSIS — R5383 Other fatigue: Secondary | ICD-10-CM | POA: Insufficient documentation

## 2012-12-31 DIAGNOSIS — R059 Cough, unspecified: Secondary | ICD-10-CM | POA: Insufficient documentation

## 2012-12-31 DIAGNOSIS — R42 Dizziness and giddiness: Secondary | ICD-10-CM | POA: Insufficient documentation

## 2012-12-31 DIAGNOSIS — H5316 Psychophysical visual disturbances: Secondary | ICD-10-CM | POA: Insufficient documentation

## 2012-12-31 DIAGNOSIS — G4700 Insomnia, unspecified: Secondary | ICD-10-CM | POA: Insufficient documentation

## 2012-12-31 DIAGNOSIS — G44221 Chronic tension-type headache, intractable: Secondary | ICD-10-CM | POA: Insufficient documentation

## 2012-12-31 DIAGNOSIS — R05 Cough: Secondary | ICD-10-CM | POA: Insufficient documentation

## 2012-12-31 DIAGNOSIS — H0520 Unspecified exophthalmos: Secondary | ICD-10-CM | POA: Insufficient documentation

## 2012-12-31 DIAGNOSIS — R079 Chest pain, unspecified: Secondary | ICD-10-CM | POA: Insufficient documentation

## 2012-12-31 DIAGNOSIS — R5381 Other malaise: Secondary | ICD-10-CM

## 2012-12-31 DIAGNOSIS — M25519 Pain in unspecified shoulder: Secondary | ICD-10-CM | POA: Insufficient documentation

## 2012-12-31 DIAGNOSIS — F329 Major depressive disorder, single episode, unspecified: Secondary | ICD-10-CM | POA: Insufficient documentation

## 2013-01-17 ENCOUNTER — Emergency Department (HOSPITAL_BASED_OUTPATIENT_CLINIC_OR_DEPARTMENT_OTHER)
Admission: EM | Admit: 2013-01-17 | Discharge: 2013-01-17 | Disposition: A | Discharge status: Home / Self Care | Payer: No Typology Code available for payment source | Attending: Emergency Medicine | Admitting: Emergency Medicine

## 2013-01-17 ENCOUNTER — Telehealth (HOSPITAL_COMMUNITY): Payer: Self-pay | Admitting: *Deleted

## 2013-01-17 ENCOUNTER — Encounter (HOSPITAL_BASED_OUTPATIENT_CLINIC_OR_DEPARTMENT_OTHER): Payer: Self-pay | Admitting: Student

## 2013-01-17 DIAGNOSIS — E785 Hyperlipidemia, unspecified: Secondary | ICD-10-CM | POA: Insufficient documentation

## 2013-01-17 DIAGNOSIS — IMO0002 Reserved for concepts with insufficient information to code with codable children: Secondary | ICD-10-CM | POA: Insufficient documentation

## 2013-01-17 DIAGNOSIS — K219 Gastro-esophageal reflux disease without esophagitis: Secondary | ICD-10-CM | POA: Insufficient documentation

## 2013-01-17 DIAGNOSIS — J4489 Other specified chronic obstructive pulmonary disease: Secondary | ICD-10-CM | POA: Insufficient documentation

## 2013-01-17 DIAGNOSIS — Z87891 Personal history of nicotine dependence: Secondary | ICD-10-CM | POA: Insufficient documentation

## 2013-01-17 DIAGNOSIS — Z9861 Coronary angioplasty status: Secondary | ICD-10-CM | POA: Insufficient documentation

## 2013-01-17 DIAGNOSIS — Z9189 Other specified personal risk factors, not elsewhere classified: Secondary | ICD-10-CM | POA: Insufficient documentation

## 2013-01-17 DIAGNOSIS — Z8719 Personal history of other diseases of the digestive system: Secondary | ICD-10-CM | POA: Insufficient documentation

## 2013-01-17 DIAGNOSIS — R000 Tachycardia, unspecified: Secondary | ICD-10-CM | POA: Insufficient documentation

## 2013-01-17 DIAGNOSIS — F411 Generalized anxiety disorder: Secondary | ICD-10-CM | POA: Insufficient documentation

## 2013-01-17 DIAGNOSIS — Z7982 Long term (current) use of aspirin: Secondary | ICD-10-CM | POA: Insufficient documentation

## 2013-01-17 DIAGNOSIS — F19939 Other psychoactive substance use, unspecified with withdrawal, unspecified: Secondary | ICD-10-CM | POA: Insufficient documentation

## 2013-01-17 DIAGNOSIS — J449 Chronic obstructive pulmonary disease, unspecified: Secondary | ICD-10-CM | POA: Insufficient documentation

## 2013-01-17 DIAGNOSIS — I10 Essential (primary) hypertension: Secondary | ICD-10-CM | POA: Insufficient documentation

## 2013-01-17 DIAGNOSIS — Z9889 Other specified postprocedural states: Secondary | ICD-10-CM | POA: Insufficient documentation

## 2013-01-17 DIAGNOSIS — Z862 Personal history of diseases of the blood and blood-forming organs and certain disorders involving the immune mechanism: Secondary | ICD-10-CM | POA: Insufficient documentation

## 2013-01-17 DIAGNOSIS — I2510 Atherosclerotic heart disease of native coronary artery without angina pectoris: Secondary | ICD-10-CM | POA: Insufficient documentation

## 2013-01-17 DIAGNOSIS — F319 Bipolar disorder, unspecified: Secondary | ICD-10-CM | POA: Insufficient documentation

## 2013-01-17 DIAGNOSIS — Z8679 Personal history of other diseases of the circulatory system: Secondary | ICD-10-CM | POA: Insufficient documentation

## 2013-01-17 LAB — URINALYSIS, ROUTINE W REFLEX MICROSCOPIC
Bilirubin Urine: NEGATIVE
Glucose, UA: NEGATIVE mg/dL
Hgb urine dipstick: NEGATIVE
Protein, ur: NEGATIVE mg/dL
Specific Gravity, Urine: 1.002 — ABNORMAL LOW (ref 1.005–1.030)

## 2013-01-17 LAB — COMPREHENSIVE METABOLIC PANEL
ALT: 40 U/L — ABNORMAL HIGH (ref 0–35)
AST: 45 U/L — ABNORMAL HIGH (ref 0–37)
Alkaline Phosphatase: 161 U/L — ABNORMAL HIGH (ref 39–117)
CO2: 22 mEq/L (ref 19–32)
Calcium: 9.5 mg/dL (ref 8.4–10.5)
GFR calc non Af Amer: 45 mL/min — ABNORMAL LOW (ref >90)
Potassium: 3.5 mEq/L (ref 3.5–5.1)
Sodium: 134 mEq/L — ABNORMAL LOW (ref 135–145)

## 2013-01-17 LAB — CBC WITH DIFFERENTIAL/PLATELET
Basophils Absolute: 0 10*3/uL (ref 0.0–0.1)
Eosinophils Relative: 1 % (ref 0–5)
Lymphocytes Relative: 16 % (ref 12–46)
Lymphs Abs: 2.5 10*3/uL (ref 0.7–4.0)
Neutro Abs: 11.3 10*3/uL — ABNORMAL HIGH (ref 1.7–7.7)
Neutrophils Relative %: 76 % (ref 43–77)
Platelets: 498 10*3/uL — ABNORMAL HIGH (ref 150–400)
RBC: 3.41 MIL/uL — ABNORMAL LOW (ref 3.87–5.11)
RDW: 13.8 % (ref 11.5–15.5)
WBC: 14.9 10*3/uL — ABNORMAL HIGH (ref 4.0–10.5)

## 2013-01-17 MED ORDER — SODIUM CHLORIDE 0.9 % IV BOLUS (SEPSIS)
1000.0000 mL | Freq: Once | INTRAVENOUS | Status: AC
  Administered 2013-01-17: 1000 mL via INTRAVENOUS

## 2013-01-17 MED ORDER — LORAZEPAM 2 MG/ML IJ SOLN
1.0000 mg | Freq: Once | INTRAMUSCULAR | Status: AC
  Administered 2013-01-17: 1 mg via INTRAVENOUS
  Filled 2013-01-17: qty 1

## 2013-01-17 MED ORDER — LORAZEPAM 1 MG PO TABS: 2.0000 mg | ORAL_TABLET | Freq: Three times a day (TID) | ORAL | Status: DC | PRN

## 2013-01-17 NOTE — ED Notes (Signed)
MD at bedside. 

## 2013-01-17 NOTE — ED Provider Notes (Signed)
History    CSN: [id number] Arrival date & time 01/17/13  1015  First MD Initiated Contact with Patient 01/17/13 1049     Chief Complaint  Patient presents with  . Tremors   (Consider location/radiation/quality/duration/timing/severity/associated sxs/prior Treatment) HPI Comments: Pt states has been restless over the last few days but yesterday actually slept longer than and felt better but this am started to have tremors and feel more anxious.  Denies SI/HI and no current hallucinations.  Eating and drinking normally.  Husband states that psych changed meds to geodon 3 days ago and pt abruptly stopped prolixin 2 days ago.  No N/V/Diarrhea.  Feels restless and skin crawling and tingling.  The history is provided by the patient and the spouse.   Past Medical History  Diagnosis Date  . Anemia, unspecified   . Major depression   . Narcotic abuse in remission   . Bipolar disorder, unspecified   . Dissociative identity disorder   . HTN (hypertension), benign     a. meds d/c'd by PCP 11/13 due to low BP in setting of significant weight loss and poor appetite related to esophagitis (Toprol restarted late Nov 2013)  . HLD (hyperlipidemia)   . GERD (gastroesophageal reflux disease)   . CAD (coronary artery disease)     a. Cypher DES to mLAD in 2007; b. MV 3/09: EF 72%, no ischemia/infarction.; c. MV (12/11) EF 73%, ? Ant soft tissue atten, no ischemia/infarction; d. LHC (1/12): patent LAD stent, mild LIs only; e. Lex MV (6/13): no ischemia or infarction.   . Hiatal hernia   . COPD (chronic obstructive pulmonary disease)     a. quit smoking 06/2010; b. PFTs (4/10):  FEV1 66%, ratio 64%, DLCO 67%  . Palpitations     a. Holter (12/11) with occasional PVCs. Holter (11/12): rare PACs and PVCs.  Marland Kitchen Hx of echocardiogram     a. Echo (1/08): EF 65%, no significant valvular abnormalities.; b. Echo (12/11): EF 55-60%, grade II diast dysfn, normal wall motion, mild LAE.; c. Echo (5/13) with EF  55-60%, mild LVH.   Marland Kitchen Esophageal stricture     a. s/p dilatation 7/12;  b. EGD 04/2012 with severe esophagitis and stricture (PPI started and dilatation deferred for several weeks)  . H/O partial nephrectomy 1997  . H/O orthostatic hypotension     probably related to psych meds  . Shortness of breath   . Dysphagia    Past Surgical History  Procedure Laterality Date  . Cesarean section    . Bladder surgery      Tack  . Nephrectomy      Part of left kidney  . Carpal tunnel release      BILATERAL HANDS  . Coronary stent placement  2007  . Abdominal hysterectomy     Family History  Problem Relation Age of Onset  . Emphysema Mother   . Ovarian cancer Mother   . Heart disease Father     mi age 21  . Heart attack Sister 30  . Heart attack Brother 30  . Colon cancer Neg Hx   . Stroke Maternal Grandmother    History  Substance Use Topics  . Smoking status: Former Smoker -- 40 years    Types: Cigarettes    Quit date: 07/07/2010  . Smokeless tobacco: Never Used     Comment: Pt states she does smoke an electronic cigarette.  . Alcohol Use: No   OB History   Grav Para Term Preterm Abortions  TAB SAB Ect Mult Living                 Review of Systems  Constitutional: Negative for fever.  Respiratory: Negative for cough and shortness of breath.   Gastrointestinal: Negative for abdominal pain.  Genitourinary: Negative for dysuria.  Neurological: Negative for speech difficulty.  Psychiatric/Behavioral: Negative for confusion.  All other systems reviewed and are negative.    Allergies  Trazodone hcl; Cefpodoxime proxetil; Erythromycin ethylsuccinate; and Perphenazine  Home Medications   Current Outpatient Rx  Name  Route  Sig  Dispense  Refill  . ziprasidone (GEODON) 80 MG capsule   Oral   Take 80 mg by mouth 2 (two) times daily with a meal.         . albuterol (PROVENTIL HFA;VENTOLIN HFA) 108 (90 BASE) MCG/ACT inhaler   Inhalation   Inhale 2 puffs into the lungs  every 6 (six) hours.         Marland Kitchen aspirin EC 81 MG tablet   Oral   Take 81 mg by mouth daily.         Marland Kitchen atorvastatin (LIPITOR) 80 MG tablet   Oral   Take 80 mg by mouth daily.         . clonazePAM (KLONOPIN) 1 MG tablet   Oral   Take 1 mg by mouth 2 (two) times daily.         . clonazePAM (KLONOPIN) 2 MG tablet   Oral   Take 2 mg by mouth at bedtime.         . cyclobenzaprine (FLEXERIL) 5 MG tablet   Oral   Take 1 tablet (5 mg total) by mouth 2 (two) times daily as needed for muscle spasms.   12 tablet   0   . diphenoxylate-atropine (LOMOTIL) 2.5-0.025 MG per tablet   Oral   Take 1 tablet by mouth 4 (four) times daily as needed for diarrhea or loose stools.   30 tablet   0   . DULoxetine (CYMBALTA) 60 MG capsule   Oral   Take 60 mg by mouth daily.          . fluPHENAZine (PROLIXIN) 1 MG tablet   Oral   Take 1-2 mg by mouth 2 (two) times daily. Takes 1 tablet in the morning and 2 tablets at bedtime         . metoprolol succinate (TOPROL-XL) 50 MG 24 hr tablet   Oral   Take 50 mg by mouth daily. Take with or immediately following a meal.         . mometasone-formoterol (DULERA) 200-5 MCG/ACT AERO   Inhalation   Inhale 2 puffs into the lungs 2 (two) times daily.         Marland Kitchen omeprazole (PRILOSEC) 40 MG capsule   Oral   Take 40 mg by mouth 2 (two) times daily.         . traMADol (ULTRAM) 50 MG tablet   Oral   Take 1 tablet (50 mg total) by mouth every 6 (six) hours as needed for pain.   15 tablet   0   . UNKNOWN TO PATIENT      New BP med started yesterday         . Zolpidem Tartrate (AMBIEN PO)   Oral   Take by mouth.          BP 139/99  Pulse 113  Temp(Src) 97.9 F (36.6 C) (Oral)  Resp 20  Wt 200 lb (  90.719 kg)  BMI 32.3 kg/m2  SpO2 99% Physical Exam  Nursing note and vitals reviewed. Constitutional: She is oriented to person, place, and time. She appears well-developed and well-nourished. She appears distressed.  Trembling  in the bed with shaking of arms and legs  HENT:  Head: Normocephalic and atraumatic.  Mouth/Throat: Oropharynx is clear and moist.  Eyes: Conjunctivae and EOM are normal. Pupils are equal, round, and reactive to light.  Neck: Normal range of motion. Neck supple.  Cardiovascular: Regular rhythm and intact distal pulses.  Tachycardia present.   No murmur heard. Pulmonary/Chest: Effort normal and breath sounds normal. No respiratory distress. She has no wheezes. She has no rales.  Abdominal: Soft. She exhibits no distension. There is no tenderness. There is no rebound and no guarding.  Musculoskeletal: Normal range of motion. She exhibits no edema and no tenderness.  Neurological: She is alert and oriented to person, place, and time. She has normal strength. No sensory deficit.  Intention/resting tremor  Skin: Skin is warm and dry. No rash noted. No erythema.  Psychiatric: She has a normal mood and affect. Her behavior is normal.    ED Course  Procedures (including critical care time) Labs Reviewed  CBC WITH DIFFERENTIAL - Abnormal; Notable for the following:    WBC 14.9 (*)    RBC 3.41 (*)    Hemoglobin 11.0 (*)    HCT 33.5 (*)    Platelets 498 (*)    Neutro Abs 11.3 (*)    Monocytes Absolute 1.1 (*)    All other components within normal limits  COMPREHENSIVE METABOLIC PANEL - Abnormal; Notable for the following:    Sodium 134 (*)    Glucose, Bld 158 (*)    Creatinine, Ser 1.30 (*)    Albumin 2.5 (*)    AST 45 (*)    ALT 40 (*)    Alkaline Phosphatase 161 (*)    GFR calc non Af Amer 45 (*)    GFR calc Af Amer 52 (*)    All other components within normal limits  URINALYSIS, ROUTINE W REFLEX MICROSCOPIC - Abnormal; Notable for the following:    Specific Gravity, Urine 1.002 (*)    All other components within normal limits  CK   No results found. 1. Medication withdrawal, with unspecified complication     MDM    Patient presenting due to 2 several days of feeling  restless and and tremors starting today. She states it feels like her skin is crawling her arms were shaking and she is extremely anxious. Patient states 2 days ago she abruptly stopped her Prolixin and started Geodon.  Patient was not sure if she was supposed to stop her Prolixin abruptly but she did. She has generalized body tremors on exam is awake and alert and denies any hallucinations at this time. She denies any fever and low suspicion for serotonin syndrome. Labs indicate mild dehydration and mild leukocytosis but otherwise no sign of urinary tract infection or electrolyte abnormality. Most likely patient's symptoms today are related to withdrawal from Prolixin.  Patient much improved after IV Ativan and IV fluids. Because she is out of it he stopped the medication for over 2 days to have her use Ativan when necessary for her symptoms and follow up with her psychiatrist in the next few days.  Gwyneth Sprout, MD 01/17/13 1332

## 2013-01-17 NOTE — ED Notes (Signed)
Pt. Reports she is ready to go home.  Pt. Husband is at bedside.

## 2013-01-17 NOTE — ED Notes (Signed)
Pt in with c/o sudden onset of tremors this morning and reports change in medication, pt reports being extremely thirsty and nauseated. Reports stopping FLUPHENAZINE and starting GEODON. Reports very little sleep in past 5 days and restless leg syndrome like s/sx for several days prior to onset of tremors in entire body

## 2013-01-23 ENCOUNTER — Telehealth: Payer: Self-pay | Admitting: Cardiology

## 2013-01-23 DIAGNOSIS — R079 Chest pain, unspecified: Secondary | ICD-10-CM

## 2013-01-23 NOTE — Telephone Encounter (Signed)
Spoke with patient. Pt states she takes metoprolol succinate and is asking if this is the cause of shaking in her hands, arms, and head that has been constant for months. Pt did not take metoprolol yesterday and did not have shaking. Pt took metoprolol today and the shaking has returned. During the last few months  adjustments have been made in psychiatric medications.  Pt states 3 days ago while cleaning she had pain in her chest that went in to her neck. She took 3 NTG with relief. Today while vacuuming pt had the same chest pain into her neck, also relieved with 3 NTG. I will forward to Dr Shirlee Latch for review. I advised pt to call [phone] if she had chest pain again not relieved with 3 NTG.

## 2013-01-23 NOTE — Telephone Encounter (Signed)
New Problem  Pt wants to speak with you regarding some chest pain that she had, she is no longer having chest pain. She also wants to talk about her medication.

## 2013-01-24 NOTE — Telephone Encounter (Signed)
Reviewed with Dr. Graciela Husbands (office DOD) who recommends pt go to ED.  I spoke with pt and gave her this information.  She reports she is not having chest pain at this time but had episode last evening. I told her she could have someone drive her to ED as long as she was not having pain at this time.  Pt is reluctant to go to ED but did agree to go.  She is aware she should go to [facility].

## 2013-01-24 NOTE — Telephone Encounter (Signed)
I don't think metoprolol is causing shaking in her arms, would continue.  She does sound like she has anginal chest pain. She needs office visit asap (today or tomorrow).  If pain has been only exertional, would set up for ETT-myoview to be done tomorrow.  If she has had CP at rest, needs to go to ER.

## 2013-01-24 NOTE — Telephone Encounter (Signed)
Pt called back to office and states she has chosen not to go to the ED.  I once again told her the recommendation was that she should be evaluated in the ED.  I told her I would let Dr. Shirlee Latch know.  There are no openings on PA or NP schedule this week.

## 2013-01-24 NOTE — Telephone Encounter (Signed)
Spoke with pt. She reports when she had chest pain last night she was not doing anything. She was lying in bed.  I told her Dr. Shirlee Latch wants her to be seen in office. I told her I would have Thurston Hole contact her with appt time to see Dr. Shirlee Latch. Pt aware Thurston Hole will be back in office tomorrow and will call her with appt time.

## 2013-01-24 NOTE — Telephone Encounter (Signed)
Follow up:    Patient called in wanting to know the answer to her question from yesterday.   Please call back.

## 2013-01-24 NOTE — Telephone Encounter (Signed)
Can she be worked on to my schedule? If not, work her onto a PA or NP schedule.  If she has not had CP at rest, arrange [employer]

## 2013-01-25 ENCOUNTER — Other Ambulatory Visit: Payer: No Typology Code available for payment source

## 2013-01-25 ENCOUNTER — Ambulatory Visit (INDEPENDENT_AMBULATORY_CARE_PROVIDER_SITE_OTHER): Payer: No Typology Code available for payment source | Admitting: *Deleted

## 2013-01-25 DIAGNOSIS — R079 Chest pain, unspecified: Secondary | ICD-10-CM

## 2013-01-25 LAB — TROPONIN I: Troponin I: <0.3 ng/mL (ref <0.30)

## 2013-01-25 NOTE — Telephone Encounter (Signed)
I reviewed with Dr Shirlee Latch. His recommendation is for pt to report to ED now. Spoke with patient. Pt declined to go to ED. She said that is not going to happen. Pt states she has not had any chest pain since night before last.  I reviewed with Dr Shirlee Latch again. He recommended pt come to office now for EKG and stat POC Troponin I.

## 2013-01-25 NOTE — Telephone Encounter (Signed)
EKG reviewed by Dr Wall--OK.

## 2013-01-25 NOTE — Telephone Encounter (Signed)
Troponin negative reviewed by Dr Shirlee Latch.

## 2013-01-25 NOTE — Telephone Encounter (Signed)
If these are OK, he recommended pt come for lexiscan myoview 01/26/13. Pt advised, verbalized understanding.

## 2013-01-25 NOTE — Addendum Note (Signed)
Addended by: Jacqlyn Krauss on: 01/25/2013 09:02 AM   Modules accepted: Orders

## 2013-01-25 NOTE — Addendum Note (Signed)
Addended by: Iona Coach on: 01/25/2013 05:30 PM   Modules accepted: Orders

## 2013-01-25 NOTE — Telephone Encounter (Signed)
Per Dr Aliene Altes can have stress test tomorrow. Pt notified.

## 2013-01-25 NOTE — Addendum Note (Signed)
Addended by: Jacqlyn Krauss on: 01/25/2013 11:08 AM   Modules accepted: Orders

## 2013-01-25 NOTE — Progress Notes (Addendum)
EKG done. Given to Katina Dung, RN, to review with MD.

## 2013-01-26 ENCOUNTER — Ambulatory Visit (HOSPITAL_COMMUNITY): Payer: No Typology Code available for payment source | Attending: Cardiology | Admitting: Radiology

## 2013-01-26 VITALS — BP 143/89 | HR 67 | Ht 66.0 in | Wt 196.0 lb

## 2013-01-26 DIAGNOSIS — R55 Syncope and collapse: Secondary | ICD-10-CM | POA: Insufficient documentation

## 2013-01-26 DIAGNOSIS — R0602 Shortness of breath: Secondary | ICD-10-CM

## 2013-01-26 DIAGNOSIS — R42 Dizziness and giddiness: Secondary | ICD-10-CM | POA: Insufficient documentation

## 2013-01-26 DIAGNOSIS — R5383 Other fatigue: Secondary | ICD-10-CM | POA: Insufficient documentation

## 2013-01-26 DIAGNOSIS — E785 Hyperlipidemia, unspecified: Secondary | ICD-10-CM | POA: Insufficient documentation

## 2013-01-26 DIAGNOSIS — Z8673 Personal history of transient ischemic attack (TIA), and cerebral infarction without residual deficits: Secondary | ICD-10-CM | POA: Insufficient documentation

## 2013-01-26 DIAGNOSIS — Z8249 Family history of ischemic heart disease and other diseases of the circulatory system: Secondary | ICD-10-CM | POA: Insufficient documentation

## 2013-01-26 DIAGNOSIS — R000 Tachycardia, unspecified: Secondary | ICD-10-CM | POA: Insufficient documentation

## 2013-01-26 DIAGNOSIS — R002 Palpitations: Secondary | ICD-10-CM | POA: Insufficient documentation

## 2013-01-26 DIAGNOSIS — I252 Old myocardial infarction: Secondary | ICD-10-CM | POA: Insufficient documentation

## 2013-01-26 DIAGNOSIS — J449 Chronic obstructive pulmonary disease, unspecified: Secondary | ICD-10-CM | POA: Insufficient documentation

## 2013-01-26 DIAGNOSIS — R0989 Other specified symptoms and signs involving the circulatory and respiratory systems: Secondary | ICD-10-CM | POA: Insufficient documentation

## 2013-01-26 DIAGNOSIS — J4489 Other specified chronic obstructive pulmonary disease: Secondary | ICD-10-CM | POA: Insufficient documentation

## 2013-01-26 DIAGNOSIS — R0609 Other forms of dyspnea: Secondary | ICD-10-CM | POA: Insufficient documentation

## 2013-01-26 DIAGNOSIS — R079 Chest pain, unspecified: Secondary | ICD-10-CM | POA: Insufficient documentation

## 2013-01-26 DIAGNOSIS — Z87891 Personal history of nicotine dependence: Secondary | ICD-10-CM | POA: Insufficient documentation

## 2013-01-26 DIAGNOSIS — R51 Headache: Secondary | ICD-10-CM

## 2013-01-26 DIAGNOSIS — R110 Nausea: Secondary | ICD-10-CM | POA: Insufficient documentation

## 2013-01-26 DIAGNOSIS — R5381 Other malaise: Secondary | ICD-10-CM | POA: Insufficient documentation

## 2013-01-26 DIAGNOSIS — I2510 Atherosclerotic heart disease of native coronary artery without angina pectoris: Secondary | ICD-10-CM

## 2013-01-26 DIAGNOSIS — I10 Essential (primary) hypertension: Secondary | ICD-10-CM | POA: Insufficient documentation

## 2013-01-26 MED ORDER — TECHNETIUM TC 99M SESTAMIBI GENERIC - CARDIOLITE
30.0000 | Freq: Once | INTRAVENOUS | Status: AC | PRN
  Administered 2013-01-26: 30 via INTRAVENOUS

## 2013-01-26 MED ORDER — AMINOPHYLLINE 25 MG/ML IV SOLN
75.0000 mg | Freq: Once | INTRAVENOUS | Status: AC
  Administered 2013-01-26: 75 mg via INTRAVENOUS

## 2013-01-26 MED ORDER — REGADENOSON 0.4 MG/5ML IV SOLN
0.4000 mg | Freq: Once | INTRAVENOUS | Status: AC
  Administered 2013-01-26: 0.4 mg via INTRAVENOUS

## 2013-01-26 MED ORDER — TECHNETIUM TC 99M SESTAMIBI GENERIC - CARDIOLITE
10.0000 | Freq: Once | INTRAVENOUS | Status: AC | PRN
  Administered 2013-01-26: 10 via INTRAVENOUS

## 2013-01-26 NOTE — Progress Notes (Addendum)
[facility] SITE 3 NUCLEAR MED [address] [place], [place] [address] [phone]    Cardiology Nuclear Med Study  [name] is a 57 y.o. female     MRN : [id number]     DOB: [birth year]  Procedure Date: 01/26/2013  Nuclear Med Background Indication for Stress Test:  Evaluation for Ischemia,PTCA/Stent Patency, and on 01-25-13:Troponin Negative History:  Asthma/COPD; '04 MI per patient; '07 Stent Mid LAD; 07-2010 Heart Catheterization-Patent LAD Stent; 11-2011 Echo: EF=55-60%, and 12-2011 Myocardial Perfusion Study-Normal, EF=66% Cardiac Risk Factors: Family History - CAD, History of Smoking, Hypertension, Lipids and TIA  Symptoms: Chest Pain with/without exertion radiates to neck (last occurrence 4 days ago),  Dizziness, DOE, Fatigue, Fatigue with Exertion, Light-Headedness, Nausea, Near Syncope, Palpitations and Rapid HR   Nuclear Pre-Procedure Caffeine/Decaff Intake:  None NPO After: 8:00pm   Lungs:  Clear, took Albuterol inhaler prior to Lexiscan, O2 Sat: 97% on room air. IV 0.9% NS with Angio Cath:  22g  IV Site: R Antecubital  IV Started by:  Bonnita Levan, RN  Chest Size (in):  38 Cup Size: B  Height: 5\' 6"  (1.676 m)  Weight:  196 lb (88.905 kg)  BMI:  Body mass index is 31.65 kg/(m^2). Tech Comments:  Held Toprol x 24 hrs. The patient complained of persistent headache after 2nd images. 2nd dose  Aminophylline 75 mg IVP given @ 10:55am with quick resolution of symptoms; total of 150 mg of Aminophylline. Irean Hong, RN    Nuclear Med Study 1 or 2 day study: 1 day  Stress Test Type:  Eugenie Birks  Reading MD: Kristeen Miss, MD  Order Authorizing Provider:  Marca Ancona, MD  Resting Radionuclide: Technetium 52m Sestamibi  Resting Radionuclide Dose: 11.0 mCi   Stress Radionuclide:  Technetium 81m Sestamibi  Stress Radionuclide Dose: 33.0 mCi           Stress Protocol Rest HR: 67 Stress HR: 107  Rest BP: 143/89 Stress BP: 182/86  Exercise Time (min):  n/a METS: n/a   Predicted Max HR: 162 bpm % Max HR: 66.05 bpm Rate Pressure Product: 21308   Dose of Adenosine (mg):  n/a Dose of Lexiscan: 0.4 mg  Dose of Atropine (mg): n/a Dose of Dobutamine: n/a mcg/kg/min (at max HR)  Stress Test Technologist: Irean Hong, RN  Nuclear Technologist:  Doyne Keel, CNMT     Rest Procedure:  Myocardial perfusion imaging was performed at rest 45 minutes following the intravenous administration of Technetium 32m Sestamibi. Rest ECG: NSR - Normal EKG  Stress Procedure:  The patient received IV Lexiscan 0.4 mg over 15-seconds.  Technetium 47m Sestamibi injected at 30-seconds. The patient complained of Chest pain, nausea, lightheadedness, and headache with Lexiscan. Aminophylline 75 mg IVP given 6 minutes in recovery with quick improvement of symptoms. Quantitative spect images were obtained after a 45 minute delay. Stress ECG: No significant change from baseline ECG  QPS Raw Data Images:  Normal; no motion artifact; normal heart/lung ratio. Stress Images:  Normal homogeneous uptake in all areas of the myocardium. Rest Images:  Normal homogeneous uptake in all areas of the myocardium. Subtraction (SDS):  No evidence of ischemia. Transient Ischemic Dilatation (Normal <1.22):  NA Lung/Heart Ratio (Normal <0.45):  0.23  Quantitative Gated Spect Images QGS EDV:  79 ml QGS ESV:  20 ml  Impression Exercise Capacity:  Lexiscan with no exercise. BP Response:  Normal blood pressure response. Clinical Symptoms:  No significant symptoms noted. ECG Impression:  No significant ST segment  change suggestive of ischemia. Comparison with Prior Nuclear Study: No significant change from previous study from 01/18/12  Overall Impression:  Normal stress nuclear study.  There is no evidence of ischmemia.    LV Ejection Fraction: 75%.  LV Wall Motion:  NL LV Function; NL Wall Motion.   Vesta Mixer, Montez Hageman., MD, [facility] 01/26/2013, 4:11 PM Office - [phone] Pager  [phone]  Normal study.  Please inform patient.   Marca Ancona 01/27/2013

## 2013-01-30 NOTE — Progress Notes (Signed)
Pt aware of results by phone. The pt will keep scheduled follow-up appointment with Norma Fredrickson NP on 02/08/13.

## 2013-02-06 ENCOUNTER — Telehealth: Payer: Self-pay | Admitting: Cardiology

## 2013-02-06 NOTE — Telephone Encounter (Signed)
Pt c/o swelling in left leg only from knee down to foot.  She denies any injury but states she has been "immobile" for some time d/t different dx that have kept her in bed.  She states she has not been keeping leg elevated above the level of her heart while sitting or in bed.  Has had swelling for about 1 week.  She is calling today because of leg heaviness and the sensation of pins and needles in her leg.  Advised Dr Shirlee Latch is not back in the office until Wednesday which she has an appt with him then.  She will contact her PCP to have elevated today and f/u with Dr Shirlee Latch on Wednesday.

## 2013-02-06 NOTE — Telephone Encounter (Signed)
New Problem  Pt states that her left leg and foot are swelling. She wants to know if she needs to be seen her or is there something she can do.

## 2013-02-08 ENCOUNTER — Ambulatory Visit: Payer: No Typology Code available for payment source | Admitting: Nurse Practitioner

## 2013-02-09 DIAGNOSIS — E876 Hypokalemia: Secondary | ICD-10-CM | POA: Insufficient documentation

## 2013-02-09 DIAGNOSIS — R609 Edema, unspecified: Secondary | ICD-10-CM | POA: Insufficient documentation

## 2013-02-24 ENCOUNTER — Encounter: Payer: Self-pay | Admitting: Nurse Practitioner

## 2013-02-24 ENCOUNTER — Ambulatory Visit (INDEPENDENT_AMBULATORY_CARE_PROVIDER_SITE_OTHER): Payer: No Typology Code available for payment source | Admitting: Nurse Practitioner

## 2013-02-24 VITALS — BP 156/100 | HR 64 | Ht 66.0 in | Wt 206.0 lb

## 2013-02-24 DIAGNOSIS — R0600 Dyspnea, unspecified: Secondary | ICD-10-CM

## 2013-02-24 DIAGNOSIS — R0609 Other forms of dyspnea: Secondary | ICD-10-CM

## 2013-02-24 DIAGNOSIS — R0989 Other specified symptoms and signs involving the circulatory and respiratory systems: Secondary | ICD-10-CM

## 2013-02-24 LAB — BASIC METABOLIC PANEL
BUN: 18 mg/dL (ref 6–23)
CO2: 35 mEq/L — ABNORMAL HIGH (ref 19–32)
Calcium: 9.4 mg/dL (ref 8.4–10.5)
Chloride: 98 mEq/L (ref 96–112)
Creatinine, Ser: 1.3 mg/dL — ABNORMAL HIGH (ref 0.4–1.2)
GFR: 43.92 mL/min — ABNORMAL LOW (ref >60.00)
Glucose, Bld: 81 mg/dL (ref 70–99)
Potassium: 3.4 mEq/L — ABNORMAL LOW (ref 3.5–5.1)
Sodium: 141 mEq/L (ref 135–145)

## 2013-02-24 LAB — BRAIN NATRIURETIC PEPTIDE: Pro B Natriuretic peptide (BNP): 19 pg/mL (ref 0.0–100.0)

## 2013-02-24 NOTE — Progress Notes (Signed)
[name] Date of Birth: [birth year] Medical Record #[id number]  History of Present Illness: Ms. Helmkamp is seen back today for a 4 month check. Seen for Dr. Shirlee Latch. Has CAD and COPD. Last cath in 2012 showed patent LAD stent. Recent Myoview from July was satisfactory. Other issues include anemia, depression, history of narcotic abuse, bipolar disorder, multiple personality disorder, HTN, HLD, GERD, COPD with past tobacco abuse, partial nephrectomy, DM, palpitations with PVCs noted on Holter, esophageal stricture with past dilatation, obesity and orthostasis.  Most recently had a Myoview. This was ok.   Comes back today. Here alone. Says this is just a regular visit. Feels pretty good for the most part. No chest pain. Has some shortness of breath. Says she is fatigued. Did have some swelling in her legs - Dr. Ivory Broad gave her some Lasix - she lost 8 pounds - cut the dose in half. Does use salt - eats boxed foods and canned soup. Was apparently on lots of BP medicines in the past but got orthostatic - now basically off of her medicines. She admits that she has not been monitoring her BP at home.   Current Outpatient Prescriptions  Medication Sig Dispense Refill  . albuterol (PROVENTIL HFA;VENTOLIN HFA) 108 (90 BASE) MCG/ACT inhaler Inhale 2 puffs into the lungs every 6 (six) hours.      Marland Kitchen aspirin EC 81 MG tablet Take 81 mg by mouth daily.      Marland Kitchen atorvastatin (LIPITOR) 80 MG tablet Take 80 mg by mouth daily.      . clonazePAM (KLONOPIN) 1 MG tablet Take 1 mg by mouth 2 (two) times daily.      . clonazePAM (KLONOPIN) 2 MG tablet Take 2 mg by mouth at bedtime.      . DULoxetine (CYMBALTA) 60 MG capsule Take 60 mg by mouth daily.       . furosemide (LASIX) 20 MG tablet Take 20 mg by mouth daily.      . metoprolol succinate (TOPROL-XL) 50 MG 24 hr tablet Take 50 mg by mouth daily. Take with or immediately following a meal.      . mometasone-formoterol (DULERA) 200-5 MCG/ACT AERO Inhale 2  puffs into the lungs 2 (two) times daily.      Marland Kitchen omeprazole (PRILOSEC) 40 MG capsule Take 40 mg by mouth 2 (two) times daily.      . potassium chloride SA (K-DUR,KLOR-CON) 20 MEQ tablet Take 20 mEq by mouth 2 (two) times daily.      . ziprasidone (GEODON) 80 MG capsule Take 80 mg by mouth 2 (two) times daily with a meal.       No current facility-administered medications for this visit.    Allergies  Allergen Reactions  . Trazodone Hcl Anaphylaxis  . Cefpodoxime Proxetil Other (See Comments)    unknown  . Erythromycin Ethylsuccinate Nausea And Vomiting  . Perphenazine Other (See Comments)    unknown    Past Medical History  Diagnosis Date  . Anemia, unspecified   . Major depression   . Narcotic abuse in remission   . Bipolar disorder, unspecified   . Dissociative identity disorder   . HTN (hypertension), benign     a. meds d/c'd by PCP 11/13 due to low BP in setting of significant weight loss and poor appetite related to esophagitis (Toprol restarted late Nov 2013)  . HLD (hyperlipidemia)   . GERD (gastroesophageal reflux disease)   . CAD (coronary artery disease)     a. Cypher  DES to mLAD in 2007; b. MV 3/09: EF 72%, no ischemia/infarction.; c. MV (12/11) EF 73%, ? Ant soft tissue atten, no ischemia/infarction; d. LHC (1/12): patent LAD stent, mild LIs only; e. Lex MV (6/13): no ischemia or infarction.   . Hiatal hernia   . COPD (chronic obstructive pulmonary disease)     a. quit smoking 06/2010; b. PFTs (4/10):  FEV1 66%, ratio 64%, DLCO 67%  . Palpitations     a. Holter (12/11) with occasional PVCs. Holter (11/12): rare PACs and PVCs.  Marland Kitchen Hx of echocardiogram     a. Echo (1/08): EF 65%, no significant valvular abnormalities.; b. Echo (12/11): EF 55-60%, grade II diast dysfn, normal wall motion, mild LAE.; c. Echo (5/13) with EF 55-60%, mild LVH.   Marland Kitchen Esophageal stricture     a. s/p dilatation 7/12;  b. EGD 04/2012 with severe esophagitis and stricture (PPI started and  dilatation deferred for several weeks)  . H/O partial nephrectomy 1997  . H/O orthostatic hypotension     probably related to psych meds  . Shortness of breath   . Dysphagia     Past Surgical History  Procedure Laterality Date  . Cesarean section    . Bladder surgery      Tack  . Nephrectomy      Part of left kidney  . Carpal tunnel release      BILATERAL HANDS  . Coronary stent placement  2007  . Abdominal hysterectomy      History  Smoking status  . Former Smoker -- 40 years  . Types: Cigarettes  . Quit date: 07/07/2010  Smokeless tobacco  . Never Used    Comment: Pt states she does smoke an electronic cigarette.    History  Alcohol Use No    Family History  Problem Relation Age of Onset  . Emphysema Mother   . Ovarian cancer Mother   . Heart disease Father     mi age 34  . Heart attack Sister 30  . Heart attack Brother 30  . Colon cancer Neg Hx   . Stroke Maternal Grandmother     Review of Systems: The review of systems is per the HPI.  All other systems were reviewed and are negative.  Physical Exam: BP 156/100  Pulse 64  Ht 5\' 6"  (1.676 m)  Wt 206 lb (93.441 kg)  BMI 33.27 kg/m2 Patient is alert and in no acute distress. Affect flat. Seems sedated to me. Skin is warm and dry. Color is normal.  HEENT is unremarkable. Normocephalic/atraumatic. PERRL. Sclera are nonicteric. Neck is supple. No masses. No JVD. Lungs are clear. Cardiac exam shows a regular rate and rhythm. Abdomen is soft. Extremities are quite full but without significant edema. Gait and ROM are intact. No gross neurologic deficits noted.  LABORATORY DATA: PENDING  Lab Results  Component Value Date   WBC 14.9* 01/17/2013   HGB 11.0* 01/17/2013   HCT 33.5* 01/17/2013   PLT 498* 01/17/2013   GLUCOSE 158* 01/17/2013   CHOL 138 06/22/2012   TRIG 195.0* 06/22/2012   HDL 35.10* 06/22/2012   LDLDIRECT 251.2 01/25/2012   LDLCALC 64 06/22/2012   ALT 40* 01/17/2013   AST 45* 01/17/2013   NA 134*  01/17/2013   K 3.5 01/17/2013   CL 96 01/17/2013   CREATININE 1.30* 01/17/2013   BUN 10 01/17/2013   CO2 22 01/17/2013   TSH 1.87 12/24/2011   INR 1.00 05/18/2011   HGBA1C 5.6 10/26/2012  Impression  Exercise Capacity: Lexiscan with no exercise.  BP Response: Normal blood pressure response.  Clinical Symptoms: No significant symptoms noted.  ECG Impression: No significant ST segment change suggestive of ischemia.  Comparison with Prior Nuclear Study: No significant change from previous study from 01/18/12   Overall Impression: Normal stress nuclear study. There is no evidence of ischmemia.  LV Ejection Fraction: 75%. LV Wall Motion: NL LV Function; NL Wall Motion.  Vesta Mixer, Montez Hageman., MD, [facility]  01/26/2013, 4:11 PM   Normal study. Please inform patient.  Marca Ancona  01/27/2013 Assessment / Plan: 1. HTN - blood pressure up - sounds like she has had orthostasis in the past - was on much more medicine in the past - she is using too much salt - she is agreeable to trying to cut back and monitoring her BP at home. Will see her back in 4 to 6 weeks.  2. Swelling - most likely from salt use. Continue low dose Lasix - check BMET today. Encouraged her to elevate her legs, use support stockings, etc.  3. Dyspnea - seems to be chronic - checking BNP today  4. CAD - no chest pain reported.   5. 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   Rosalio Macadamia, RN, ANP-C [facility] [address] [place], [place]  [address]

## 2013-02-24 NOTE — Patient Instructions (Addendum)
Really try to cut back on the salt  Monitor your blood pressure at home and keep a record - bring this in when you come back   We will see you in 4 to 6 weeks to review  We will check labs today  For now, stay on your current medicines  Call the [facility] office at [phone] if you have any questions, problems or concerns.

## 2013-02-28 ENCOUNTER — Other Ambulatory Visit: Payer: Self-pay | Admitting: *Deleted

## 2013-03-08 ENCOUNTER — Telehealth: Payer: Self-pay | Admitting: Cardiology

## 2013-03-08 NOTE — Telephone Encounter (Signed)
New problem   Pt wants to discuss swelling in her feet

## 2013-03-08 NOTE — Telephone Encounter (Signed)
Needs to weigh daily  Needs to continue with sodium restriction - use support stockings  Stay on Lasix  May add Aldactone 25 mg to take only 1/2 a pill a day. BMET in one week.   Needs to get a follow up with Dr. Shirlee Latch.

## 2013-03-08 NOTE — Telephone Encounter (Signed)
Pt states that she continues to have swelling in both her feet and lower legs. She was advised at office visit 02/24/13 to keep her feet and legs elevated as much as possible and avoid sodium, stay on current lasix dose until next office visit.  Pt has done this but the swelling in her feet and legs has not improved. Her lower legs are painful with pins and needles sensation. She does not weigh regularly. She does continue to be SOB. I will forward to Dawayne Anastasya, NP  for review and recommendations.

## 2013-03-10 ENCOUNTER — Other Ambulatory Visit: Payer: Self-pay | Admitting: *Deleted

## 2013-03-10 MED ORDER — SPIRONOLACTONE 25 MG PO TABS: 12.5000 mg | ORAL_TABLET | Freq: Every day | ORAL | Status: DC

## 2013-03-10 NOTE — Telephone Encounter (Signed)
S/w pt stated verbal understanding of instructions to weigh daily, restrict salt, use support hose, stay on lasix, start aldactone ( 12.5 mg ) daily and will come in on 8/19 for a repeat bmet and has a f/u visit with Lawson Fiscal in September

## 2013-03-14 ENCOUNTER — Other Ambulatory Visit (INDEPENDENT_AMBULATORY_CARE_PROVIDER_SITE_OTHER): Payer: No Typology Code available for payment source

## 2013-03-14 DIAGNOSIS — N183 Chronic kidney disease, stage 3 unspecified: Secondary | ICD-10-CM

## 2013-03-14 LAB — BASIC METABOLIC PANEL
BUN: 20 mg/dL (ref 6–23)
CO2: 31 mEq/L (ref 19–32)
Calcium: 9.5 mg/dL (ref 8.4–10.5)
Chloride: 100 mEq/L (ref 96–112)
Creatinine, Ser: 1.5 mg/dL — ABNORMAL HIGH (ref 0.4–1.2)
GFR: 37.89 mL/min — ABNORMAL LOW (ref >60.00)
Glucose, Bld: 98 mg/dL (ref 70–99)
Potassium: 4.1 mEq/L (ref 3.5–5.1)
Sodium: 139 mEq/L (ref 135–145)

## 2013-03-31 ENCOUNTER — Encounter: Payer: Self-pay | Admitting: Nurse Practitioner

## 2013-03-31 ENCOUNTER — Ambulatory Visit (INDEPENDENT_AMBULATORY_CARE_PROVIDER_SITE_OTHER): Payer: No Typology Code available for payment source | Admitting: Nurse Practitioner

## 2013-03-31 ENCOUNTER — Other Ambulatory Visit: Payer: Self-pay | Admitting: *Deleted

## 2013-03-31 VITALS — BP 138/90 | HR 92 | Ht 66.0 in | Wt 207.0 lb

## 2013-03-31 DIAGNOSIS — N189 Chronic kidney disease, unspecified: Secondary | ICD-10-CM

## 2013-03-31 DIAGNOSIS — R609 Edema, unspecified: Secondary | ICD-10-CM

## 2013-03-31 LAB — BASIC METABOLIC PANEL
BUN: 25 mg/dL — ABNORMAL HIGH (ref 6–23)
CO2: 30 mEq/L (ref 19–32)
Calcium: 9.3 mg/dL (ref 8.4–10.5)
Chloride: 100 mEq/L (ref 96–112)
Creatinine, Ser: 1.7 mg/dL — ABNORMAL HIGH (ref 0.4–1.2)
GFR: 32.13 mL/min — ABNORMAL LOW (ref >60.00)
Glucose, Bld: 148 mg/dL — ABNORMAL HIGH (ref 70–99)
Potassium: 3.5 mEq/L (ref 3.5–5.1)
Sodium: 138 mEq/L (ref 135–145)

## 2013-03-31 NOTE — Patient Instructions (Addendum)
We need to recheck your lab today  See Dr. Shirlee Latch in 3 months  Stay on your current medicines  Keep away from excessive salt  Call the [facility] office at [phone] if you have any questions, problems or concerns.

## 2013-03-31 NOTE — Progress Notes (Signed)
[name] Date of Birth: 1955/12/07 Medical Record #[id number]  History of Present Illness: [name] is seen back today for a 5 week check. Seen for Dr. [name]. Has CAD and COPD. Last cath in 2012 showed patent LAD stent. Recent Myoview from July was satisfactory. Other issues include anemia, depression, history of narcotic abuse, bipolar disorder, multiple personality disorder, HTN, HLD, GERD, COPD with past tobacco abuse, partial nephrectomy, DM, palpitations with PVCs noted on Holter, esophageal stricture with past dilatation, obesity and orthostasis. Has had a recent had a Myoview from July 2014. This was ok.   Seen about 5 weeks ago with swelling and shortness of breath. Dr. Ivory [name] gave her some [name] - she lost 8 pounds - cut the dose in half. Was eating lots of salt. Was apparently on lots of BP medicines in the past but got orthostatic and most of her medicines were stopped.   Has continued to have issues with swelling and we started low dose [name].   Comes back today. Here alone. Doing ok. She tells me that she is feeling better. She is not short of breath. Her swelling has improved. No chest pain. BP has improved. Overall, she is quite happy with how she is doing at this time.   Current Outpatient Prescriptions  Medication Sig Dispense Refill  . albuterol ([name];[name]) 108 (90 BASE) MCG/ACT inhaler Inhale 2 puffs into the lungs every 6 (six) hours.      Marland Kitchen [name] EC 81 MG tablet Take 81 mg by mouth daily.      Marland Kitchen [name] (LIPITOR) 80 MG tablet Take 80 mg by mouth daily.      . clonazePAM ([name]) 1 MG tablet Take 1 mg by mouth 3 (three) times daily as needed.       . clonazePAM ([name]) 2 MG tablet Take 2 mg by mouth at bedtime.      . [name] ([name]) 60 MG capsule Take 60 mg by mouth daily.       . furosemide ([name]) 20 MG tablet Take 20 mg by mouth daily.      . [name] succinate ([name]) 50 MG 24 hr tablet Take 50 mg by mouth  daily. Take with or immediately following a meal.      . mometasone-formoterol (DULERA) 200-5 MCG/ACT AERO Inhale 2 puffs into the lungs 2 (two) times daily.      Marland Kitchen [name] ([name]) 40 MG capsule Take 40 mg by mouth 2 (two) times daily.      . potassium chloride SA ([name],[name]) 20 MEQ tablet Take 20 mEq by mouth 2 (two) times daily.      Marland Kitchen [name] ([name]) 25 MG tablet Take 0.5 tablets (12.5 mg total) by mouth daily.  30 tablet  3  . [name] ([name]) 80 MG capsule Take 80 mg by mouth 2 (two) times daily with a meal.       No current facility-administered medications for this visit.    Allergies  Allergen Reactions  . Trazodone Hcl Anaphylaxis  . Cefpodoxime Proxetil Other (See Comments)    unknown  . Erythromycin Ethylsuccinate Nausea And Vomiting  . Perphenazine Other (See Comments)    unknown    Past Medical History  Diagnosis Date  . Anemia, unspecified   . Major depression   . Narcotic abuse in remission   . Bipolar disorder, unspecified   . Dissociative identity disorder   . HTN (hypertension), benign     a. meds d/c'd by PCP 11/13 due to  low BP in setting of significant weight loss and poor appetite related to esophagitis (Toprol restarted late Nov 2013)  . HLD (hyperlipidemia)   . GERD (gastroesophageal reflux disease)   . CAD (coronary artery disease)     a. Cypher DES to mLAD in 2007; b. MV 3/09: EF 72%, no ischemia/infarction.; c. MV (12/11) EF 73%, ? Ant soft tissue atten, no ischemia/infarction; d. LHC (1/12): patent LAD stent, mild LIs only; e. Lex MV (6/13): no ischemia or infarction.   . Hiatal hernia   . COPD (chronic obstructive pulmonary disease)     a. quit smoking 06/2010; b. PFTs (4/10):  FEV1 66%, ratio 64%, DLCO 67%  . Palpitations     a. Holter (12/11) with occasional PVCs. Holter (11/12): rare PACs and PVCs.  Marland Kitchen Hx of echocardiogram     a. Echo (1/08): EF 65%, no significant valvular abnormalities.; b. Echo (12/11): EF 55-60%,  grade II diast dysfn, normal wall motion, mild LAE.; c. Echo (5/13) with EF 55-60%, mild LVH.   Marland Kitchen Esophageal stricture     a. s/p dilatation 7/12;  b. EGD 04/2012 with severe esophagitis and stricture (PPI started and dilatation deferred for several weeks)  . H/O partial nephrectomy 1997  . H/O orthostatic hypotension     probably related to psych meds  . Shortness of breath   . Dysphagia     Past Surgical History  Procedure Laterality Date  . Cesarean section    . Bladder surgery      Tack  . Nephrectomy      Part of left kidney  . Carpal tunnel release      BILATERAL HANDS  . Coronary stent placement  2007  . Abdominal hysterectomy      History  Smoking status  . Former Smoker -- 40 years  . Types: Cigarettes  . Quit date: 07/07/2010  Smokeless tobacco  . Never Used    Comment: Pt states she does smoke an electronic cigarette.    History  Alcohol Use No    Family History  Problem Relation Age of Onset  . Emphysema Mother   . Ovarian cancer Mother   . Heart disease Father     mi age 6  . Heart attack Sister 30  . Heart attack Brother 30  . Colon cancer Neg Hx   . Stroke Maternal Grandmother     Review of Systems: The review of systems is per the HPI.  All other systems were reviewed and are negative.  Physical Exam: BP 138/90  Pulse 92  Ht 5\' 6"  (1.676 m)  Wt 207 lb (93.895 kg)  BMI 33.43 kg/m2 Patient is very pleasant and in no acute distress. Skin is warm and dry. Color is normal.  HEENT is unremarkable. Normocephalic/atraumatic. PERRL. Sclera are nonicteric. Neck is supple. No masses. No JVD. Lungs are clear. Cardiac exam shows a regular rate and rhythm. Abdomen is soft. Extremities are full but without edema. Gait and ROM are intact. No gross neurologic deficits noted.   LABORATORY DATA: BMET pending   Lab Results  Component Value Date   WBC 14.9* 01/17/2013   HGB 11.0* 01/17/2013   HCT 33.5* 01/17/2013   PLT 498* 01/17/2013   GLUCOSE 98  03/14/2013   CHOL 138 06/22/2012   TRIG 195.0* 06/22/2012   HDL 35.10* 06/22/2012   LDLDIRECT 251.2 01/25/2012   LDLCALC 64 06/22/2012   ALT 40* 01/17/2013   AST 45* 01/17/2013   NA 139 03/14/2013   K  4.1 03/14/2013   CL 100 03/14/2013   CREATININE 1.5* 03/14/2013   BUN 20 03/14/2013   CO2 31 03/14/2013   TSH 1.87 12/24/2011   INR 1.00 05/18/2011   HGBA1C 5.6 10/26/2012   Pro B Natriuretic peptide (BNP) 0.0 - 100.0 pg/mL  19.0     Assessment / Plan: 1. HTN - BP has improved.   2. Swelling - improved with current regimen. Encouraged her to continue to restrict salt. No change in her medicines at this time. We are rechecking a BMET today.   3. Dyspnea - seems to be a chronic issue - but currently improved.   4. CAD - no chest pain  5. Psyche disorder - on multiple medicines.   Patient is agreeable to this plan and will call if any problems develop in the interim.   Rosalio Macadamia, RN, ANP-C [facility] [address] [place], [place]  [address]

## 2013-04-03 DIAGNOSIS — N200 Calculus of kidney: Secondary | ICD-10-CM | POA: Insufficient documentation

## 2013-04-03 DIAGNOSIS — H6120 Impacted cerumen, unspecified ear: Secondary | ICD-10-CM | POA: Insufficient documentation

## 2013-04-03 DIAGNOSIS — M549 Dorsalgia, unspecified: Secondary | ICD-10-CM | POA: Insufficient documentation

## 2013-04-07 ENCOUNTER — Other Ambulatory Visit: Payer: No Typology Code available for payment source

## 2013-04-13 ENCOUNTER — Telehealth: Payer: Self-pay | Admitting: Nurse Practitioner

## 2013-04-13 NOTE — Telephone Encounter (Signed)
SEE BMET LAB NOTE// lasix was stopped/ still on med list,  C/o swelling in the feet/legs and hands with SOB when walking. Pt requests to go back on lasix.  Pt's sister died and pt is leaving town/ if not available at home number she can be reached at [phone] mother's  Sodium in her diet was reviewed and admits to still eating sodium rich foods. Pt has partial nephrectomy / creat 1.7 //bun 25 on  03/31/13  Pt aware Dr Shirlee Latch is in hospital working and Norma Fredrickson NP out today and may not hear back till tomorrow. Please advise.

## 2013-04-13 NOTE — Telephone Encounter (Signed)
New Problem  Pt states that she was recently taken off of her fluid pill and is now swelling very bad// has had a death in the family and has to travel. Wants to know if she can resume consumption of the fluid pill to lower the swelling at least until she gets back.Marland Kitchen

## 2013-04-13 NOTE — Telephone Encounter (Signed)
Can restart Lasix 20 mg daily.  Will need BMET in 1 week because creatinine had risen recently.

## 2013-04-13 NOTE — Telephone Encounter (Signed)
Pt informed, lab date given. bmet attached to visit.

## 2013-04-14 NOTE — Telephone Encounter (Signed)
I think she really needs to focus on sodium restriction - will see what her labs look like on repeat next week but this is going to be an ongoing issue that she is going to need to manage with support stockings and strict sodium restriction - not more diuretics - not good for long term use.

## 2013-04-21 ENCOUNTER — Other Ambulatory Visit: Payer: No Typology Code available for payment source

## 2013-04-24 ENCOUNTER — Other Ambulatory Visit (INDEPENDENT_AMBULATORY_CARE_PROVIDER_SITE_OTHER): Payer: No Typology Code available for payment source

## 2013-04-24 ENCOUNTER — Other Ambulatory Visit: Payer: Self-pay | Admitting: *Deleted

## 2013-04-24 DIAGNOSIS — N189 Chronic kidney disease, unspecified: Secondary | ICD-10-CM

## 2013-04-24 LAB — BASIC METABOLIC PANEL
BUN: 18 mg/dL (ref 6–23)
CO2: 32 mEq/L (ref 19–32)
Calcium: 9.5 mg/dL (ref 8.4–10.5)
Chloride: 100 mEq/L (ref 96–112)
Creatinine, Ser: 1.6 mg/dL — ABNORMAL HIGH (ref 0.4–1.2)
GFR: 35.67 mL/min — ABNORMAL LOW (ref >60.00)
Glucose, Bld: 115 mg/dL — ABNORMAL HIGH (ref 70–99)
Potassium: 3.1 mEq/L — ABNORMAL LOW (ref 3.5–5.1)
Sodium: 140 mEq/L (ref 135–145)

## 2013-04-24 MED ORDER — POTASSIUM CHLORIDE CRYS ER 20 MEQ PO TBCR: 20.0000 meq | EXTENDED_RELEASE_TABLET | Freq: Three times a day (TID) | ORAL | Status: DC

## 2013-05-04 DIAGNOSIS — I10 Essential (primary) hypertension: Secondary | ICD-10-CM | POA: Insufficient documentation

## 2013-05-04 DIAGNOSIS — E119 Type 2 diabetes mellitus without complications: Secondary | ICD-10-CM | POA: Insufficient documentation

## 2013-05-04 DIAGNOSIS — Z0000 Encounter for general adult medical examination without abnormal findings: Secondary | ICD-10-CM | POA: Insufficient documentation

## 2013-05-09 DIAGNOSIS — L299 Pruritus, unspecified: Secondary | ICD-10-CM | POA: Insufficient documentation

## 2013-05-09 DIAGNOSIS — N399 Disorder of urinary system, unspecified: Secondary | ICD-10-CM | POA: Insufficient documentation

## 2013-05-22 ENCOUNTER — Other Ambulatory Visit (HOSPITAL_COMMUNITY): Payer: Self-pay | Admitting: Internal Medicine

## 2013-05-24 ENCOUNTER — Ambulatory Visit (HOSPITAL_COMMUNITY)
Admission: RE | Admit: 2013-05-24 | Discharge: 2013-05-24 | Disposition: A | Discharge status: Home / Self Care | Payer: No Typology Code available for payment source | Source: Ambulatory Visit | Attending: Internal Medicine | Admitting: Internal Medicine

## 2013-05-24 DIAGNOSIS — N2881 Hypertrophy of kidney: Secondary | ICD-10-CM | POA: Insufficient documentation

## 2013-05-24 DIAGNOSIS — N189 Chronic kidney disease, unspecified: Secondary | ICD-10-CM | POA: Insufficient documentation

## 2013-05-24 DIAGNOSIS — Q618 Other cystic kidney diseases: Secondary | ICD-10-CM | POA: Insufficient documentation

## 2013-06-03 ENCOUNTER — Other Ambulatory Visit: Payer: Self-pay | Admitting: Internal Medicine

## 2013-06-28 DIAGNOSIS — T169XXA Foreign body in ear, unspecified ear, initial encounter: Secondary | ICD-10-CM | POA: Insufficient documentation

## 2013-07-06 ENCOUNTER — Ambulatory Visit: Payer: No Typology Code available for payment source | Admitting: Cardiology

## 2013-08-02 ENCOUNTER — Telehealth: Payer: Self-pay | Admitting: Cardiology

## 2013-08-02 NOTE — Telephone Encounter (Signed)
New Problem:  Pt is requesting a call back from the nurse to discuss her plan of care.

## 2013-08-02 NOTE — Telephone Encounter (Signed)
Spoke with patient. Pt missed Dec appt with Dr Aundra Dubin, rescheduled to January at pt's request. Pt not having any problems.

## 2013-08-09 DIAGNOSIS — Z23 Encounter for immunization: Secondary | ICD-10-CM | POA: Insufficient documentation

## 2013-08-17 DIAGNOSIS — T8069XA Other serum reaction due to other serum, initial encounter: Secondary | ICD-10-CM | POA: Insufficient documentation

## 2013-08-17 DIAGNOSIS — IMO0002 Reserved for concepts with insufficient information to code with codable children: Secondary | ICD-10-CM | POA: Insufficient documentation

## 2013-08-24 ENCOUNTER — Encounter: Payer: Self-pay | Admitting: Cardiology

## 2013-08-24 ENCOUNTER — Ambulatory Visit (INDEPENDENT_AMBULATORY_CARE_PROVIDER_SITE_OTHER): Payer: No Typology Code available for payment source | Admitting: Cardiology

## 2013-08-24 VITALS — BP 140/80 | HR 67 | Ht 66.0 in | Wt 229.8 lb

## 2013-08-24 DIAGNOSIS — E785 Hyperlipidemia, unspecified: Secondary | ICD-10-CM

## 2013-08-24 DIAGNOSIS — J449 Chronic obstructive pulmonary disease, unspecified: Secondary | ICD-10-CM

## 2013-08-24 DIAGNOSIS — I2510 Atherosclerotic heart disease of native coronary artery without angina pectoris: Secondary | ICD-10-CM

## 2013-08-24 DIAGNOSIS — I10 Essential (primary) hypertension: Secondary | ICD-10-CM

## 2013-08-24 MED ORDER — ATORVASTATIN CALCIUM 40 MG PO TABS: 40.0000 mg | ORAL_TABLET | Freq: Every day | ORAL | Status: DC

## 2013-08-24 NOTE — Progress Notes (Signed)
Patient ID: [name], female   DOB: Jan 23, 1956, 57 y.o.   MRN: [id number] PCP: Dr. Leory Plowman  58 yo with history of CAD and COPD presents for followup.  Last LHC in 2012 showed patent LAD stent.  Echo in 5/13 showed preserved EF and Lexiscan Cardiolite in 7/14 was normal.  BP has been stable.  She has not had chest pain recently.  She is seeing a nephrologist.  She says that her nephrologist stopped her statin.  I am unclear why; she denies side effects.  She has chronic dyspnea after walking about 1 block or walking up steps.  No orthopnea/PND.  She does report fatigue/daytime sleepiness.    Labs (12/11): BNP 40  Labs (1/12): HCT 38, D dimer negative, LDL 70, HDL 41, creatinine 1.29, TSH  Labs (10/12): TSH normal, LDL 87, HDL 36, K 3.4, creatinine 1.16 Labs (7/13): LDL 251 Labs (9/13): LDL 115, TGs 195, HDL 36, LFTs normal, K 3.3, creatinine 1.2 Labs (11/13); LDL 64, HDL 35 Labs (1/14): K 3.7, creatinine 1.5 Labs (9/14): K 3.1, creatinine 1.6  ECG: NSR, normal  Allergies (verified):  1) ! * Vantin  2) Erythromycin Ethylsuccinate (Erythromycin Ethylsuccinate)  3) Cephalexin (Cephalexin)  4) Trazodone Hcl (Trazodone Hcl)   Past Medical History:  1. 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 DEPRESSION, MAJOR (ICD-296.20)  3. Hx of NARCOTIC ABUSE (ICD-305.90)  4. BIPOLAR DISORDER UNSPECIFIED (ICD-296.80)  5. MULTIPLE PERSONALITY DISORDER (ICD-300.14)  6. HYPERTENSION (ICD-401.9)  7. HYPERLIPIDEMIA (ICD-272.4)  8. G E R D (ICD-530.81) with esophagitis and history of esophageal stricture.  9. C O P D (ICD-496): Quit smoking in 12/11. PFTs (4/10): FEV1 66%, ratio 64%, DLCO 67%.  10. CORONARY HEART DISEASE (ICD-414.00): Cypher DES to mid LAD in 2007. Myoview 3/09 with EF 72%, no ischemia or infarction. Myoview (12/11) EF 73%, suspected anterior soft tissue attenuation with no ischemia or infarction.  Left heart cath (1/12): patent LAD stent, mild LIs only.  Lexiscan myoview (6/13) showed  no ischemia or infarction.  Lexiscan Cardiolite (7/14) with no ischemia/infarction, EF 75%.  11. Partial nephrectomy 1997  12. Diabetes mellitus  13. Echo (1/08): EF 65%, no significant valvular abnormalities. Echo (12/11): EF 55-60%, grade II diastolic dysfunction, normal wall motion, mild left atrial enlargement.  Echo (5/13) with EF 55-60%, mild LVH.  14. Palpitations: Holter (12/11) with occasional PVCs.  Holter (11/12): rare PACs and PVCs.  15. Esophageal stricture s/p dilatation in 7/12.  16. Obesity.  17. Orthostatic hypotension likely related to psych meds  Family History:  emphysema: mother  asthma: mother  heart disease: father with MI at 24, brother and sister with MIs in their 3s.  Sister died with MI at 55.   cancer: mother (ovarian)   Social History:  pt is married with children, lives in [place].  Unemployed  Tobacco Use - Former. 07/07/10  ROS: All systems reviewed and negative except as per HPI.   Current Outpatient Prescriptions  Medication Sig Dispense Refill  . albuterol (PROVENTIL HFA;VENTOLIN HFA) 108 (90 BASE) MCG/ACT inhaler Inhale 2 puffs into the lungs every 6 (six) hours.      Marland Kitchen aspirin EC 81 MG tablet Take 81 mg by mouth daily.      . clonazePAM (KLONOPIN) 1 MG tablet Take 1 mg by mouth 3 (three) times daily as needed.       . clonazePAM (KLONOPIN) 2 MG tablet Take 2 mg by mouth at bedtime.      Marland Kitchen doxycycline (MONODOX) 100 MG capsule  Take 100 mg by mouth 2 (two) times daily.       . DULoxetine (CYMBALTA) 60 MG capsule Take 60 mg by mouth daily.       . fenofibrate 160 MG tablet Take 160 mg by mouth daily.       Marland Kitchen labetalol (NORMODYNE) 100 MG tablet Take 100 mg by mouth 2 (two) times daily.       Marland Kitchen lisinopril (PRINIVIL,ZESTRIL) 10 MG tablet Take 5 mg by mouth daily.       . mometasone-formoterol (DULERA) 200-5 MCG/ACT AERO Inhale 2 puffs into the lungs 2 (two) times daily.      . nitroGLYCERIN (NITROSTAT) 0.4 MG SL tablet Place 0.4 mg under the tongue  every 5 (five) minutes as needed for chest pain.      . Omega-3 Fatty Acids (FISH OIL) 300 MG CAPS Take 360 mg by mouth 2 (two) times daily.      Marland Kitchen omeprazole (PRILOSEC) 40 MG capsule TAKE 1 CAPSULE BY MOUTH TWICE DAILY  60 capsule  3  . potassium chloride SA (K-DUR,KLOR-CON) 20 MEQ tablet Take 20 mEq by mouth 2 (two) times daily.      Marland Kitchen Propylene Glycol (SYSTANE BALANCE OP) Apply to eye.      . spironolactone (ALDACTONE) 25 MG tablet Take 0.5 tablets (12.5 mg total) by mouth daily.  30 tablet  3  . torsemide (DEMADEX) 10 MG tablet Take 10 mg by mouth once.       . ziprasidone (GEODON) 80 MG capsule Take 80 mg by mouth 2 (two) times daily with a meal.      . atorvastatin (LIPITOR) 40 MG tablet Take 1 tablet (40 mg total) by mouth daily.  30 tablet  3   No current facility-administered medications for this visit.    BP 140/80  Pulse 67  Ht 5\' 6"  (1.676 m)  Wt 104.237 kg (229 lb 12.8 oz)  BMI 37.11 kg/m2 General: NAD, obese.  Neck: Thick neck, no JVD, no thyromegaly or thyroid nodule.  Lungs: CTAB CV: Nondisplaced PMI.  Heart regular S1/S2, no S3/S4, no murmur.  No edema.  No carotid bruit.  Normal pedal pulses.  Abdomen: Soft, nontender, no hepatosplenomegaly, no distention.  Neurologic: Alert and oriented x 3.  Psych: Normal affect. Extremities: No clubbing or cyanosis.   Assessment/Plan: 1. CAD: No recent chest pain.  She had a negative Cardiolite in 7/14.  Continue ASA, ACEI, beta blocker.  She needs to restart statin.  2. Hyperlipidemia: Stopped statin for unclear reasons, no side effects.  Restart atorvastatin 40 mg daily with lipids/LFTs in 2 months.   3. HTN: History of labile BP.  BP seems to be better recently. BMET today with spironolactone use.  4. Obesity: Continue efforts at weight loss.  Unfortunately, it is up again.  She needs to work aggressively with dietary changes and exercise.  5. OSA: I suspect OSA.   I will arrange for a sleep study.    Loralie Champagne 08/24/2013

## 2013-08-24 NOTE — Patient Instructions (Signed)
Your physician recommends that you have  lab work today--BMET.  Start atorvastatin 40mg  daily.   Your physician has recommended that you have a sleep study. This test records several body functions during sleep, including: brain activity, eye movement, oxygen and carbon dioxide blood levels, heart rate and rhythm, breathing rate and rhythm, the flow of air through your mouth and nose, snoring, body muscle movements, and chest and belly movement.  Your physician recommends that you return for a FASTING lipid profile /liver profile in 2 months.  Your physician wants you to follow-up in: 6 months with Dr Aundra Dubin. (July 2015).  You will receive a reminder letter in the mail two months in advance. If you don't receive a letter, please call our office to schedule the follow-up appointment.

## 2013-08-25 LAB — BASIC METABOLIC PANEL
BUN: 21 mg/dL (ref 6–23)
CO2: 34 meq/L — AB (ref 19–32)
CREATININE: 1.6 mg/dL — AB (ref 0.4–1.2)
Calcium: 9.1 mg/dL (ref 8.4–10.5)
Chloride: 103 mEq/L (ref 96–112)
GFR: 34.61 mL/min — ABNORMAL LOW (ref >60.00)
GLUCOSE: 88 mg/dL (ref 70–99)
Potassium: 4 mEq/L (ref 3.5–5.1)
Sodium: 141 mEq/L (ref 135–145)

## 2013-09-11 ENCOUNTER — Encounter (HOSPITAL_BASED_OUTPATIENT_CLINIC_OR_DEPARTMENT_OTHER): Payer: No Typology Code available for payment source

## 2013-09-22 ENCOUNTER — Encounter (HOSPITAL_BASED_OUTPATIENT_CLINIC_OR_DEPARTMENT_OTHER): Payer: No Typology Code available for payment source

## 2013-10-17 ENCOUNTER — Other Ambulatory Visit: Payer: Self-pay | Admitting: Internal Medicine

## 2013-10-20 ENCOUNTER — Other Ambulatory Visit: Payer: No Typology Code available for payment source

## 2013-10-22 IMAGING — CT CT ANGIO CHEST
2 of 6 series · 19 of 46 positions shown · IV contrast (APPLIED)
Comparison: Chest radiograph performed earlier today at [DATE] a.m.,
and CTA of the chest performed 09/27/2008

CLINICAL DATA: Shortness of breath and mid chest pain; hypoxia,
tachycardia and dyspnea.  Elevated D-dimer.  History of diabetes.

CT ANGIOGRAPHY CHEST
TECHNIQUE: Multidetector CT imaging of the chest using the
standard protocol during bolus administration of intravenous
contrast. Multiplanar reconstructed images including MIPs were
obtained and reviewed to evaluate the vascular anatomy.
Contrast: 100mL OMNIPAQUE IOHEXOL 350 MG/ML IV SOLN

[Series 6: pulm embolism 1.0 b25f thin · axial · 0.74mm/px · z∈[-182,+72]mm · 16 of 281 slices shown]
[im 13/281  lung]
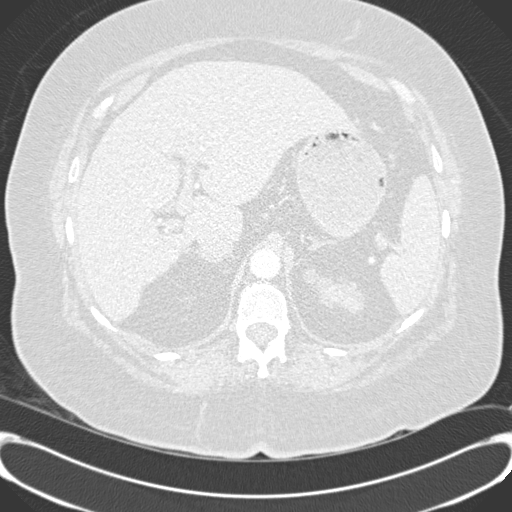
[im 37/281  soft-tissue]
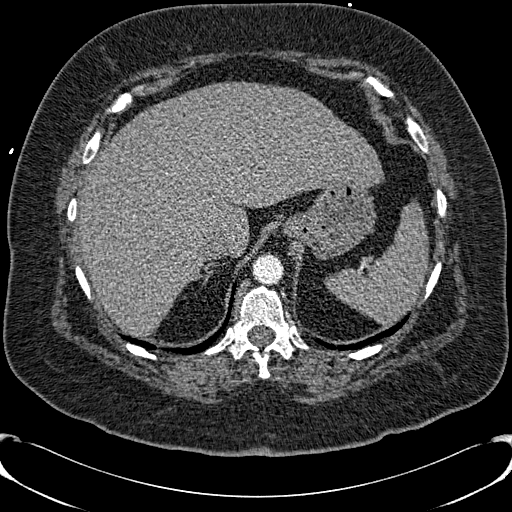
[im 49/281  lung]
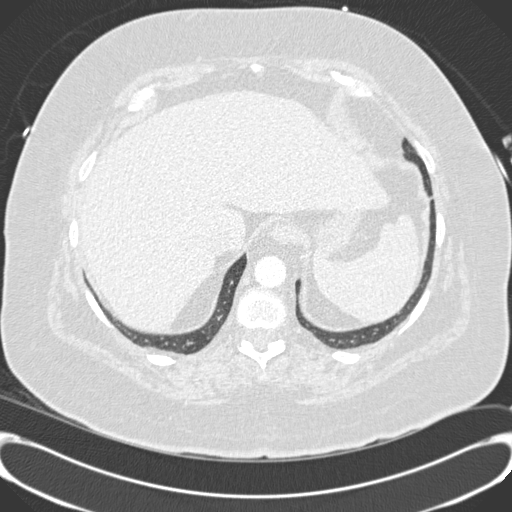
[im 61/281  soft-tissue]
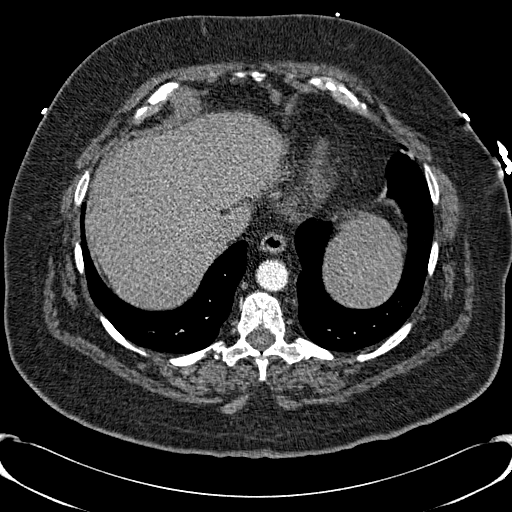
[im 86/281  lung]
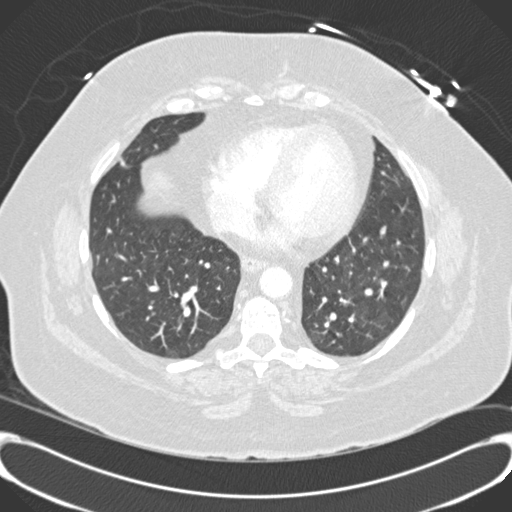
[im 98/281  soft-tissue]
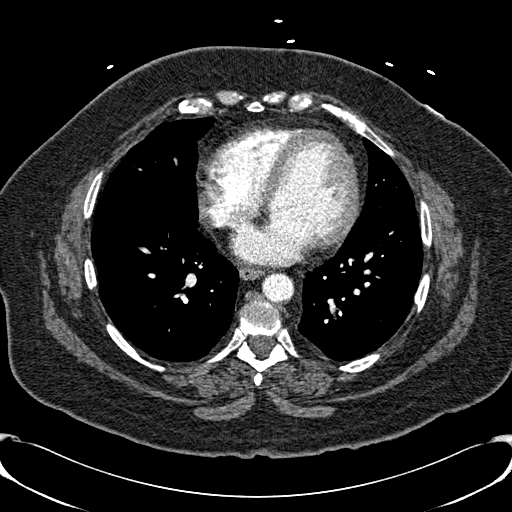
[im 110/281  lung]
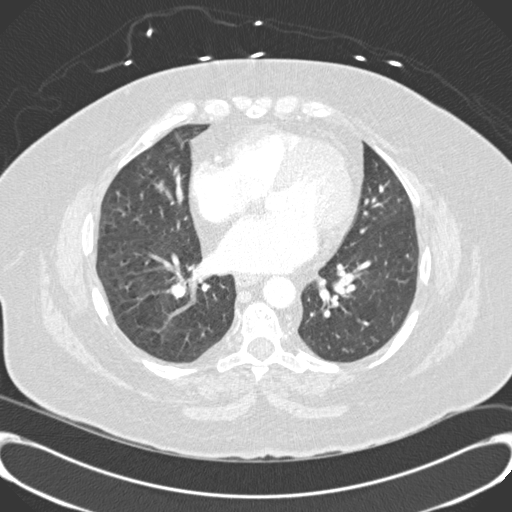
[im 134/281  soft-tissue]
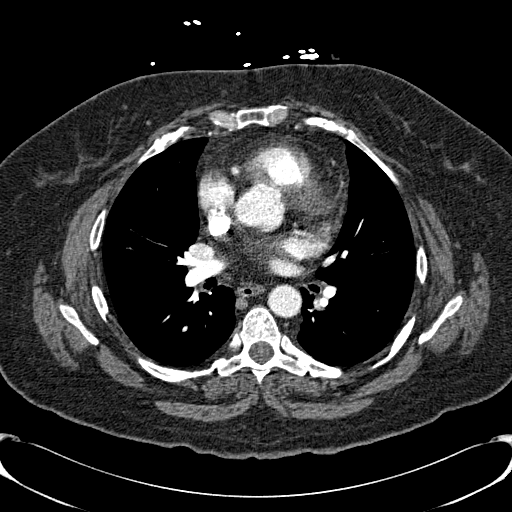
[im 147/281  lung]
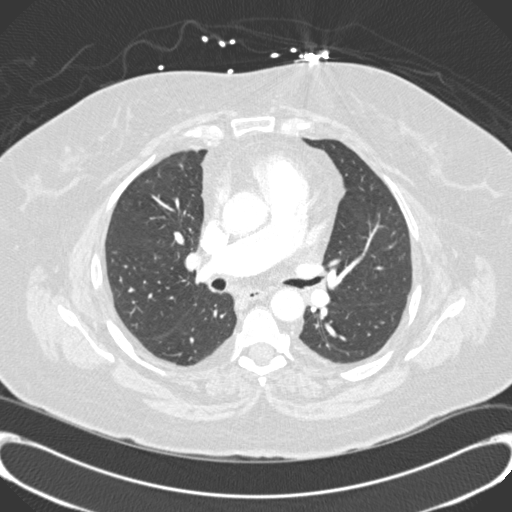
[im 171/281  soft-tissue]
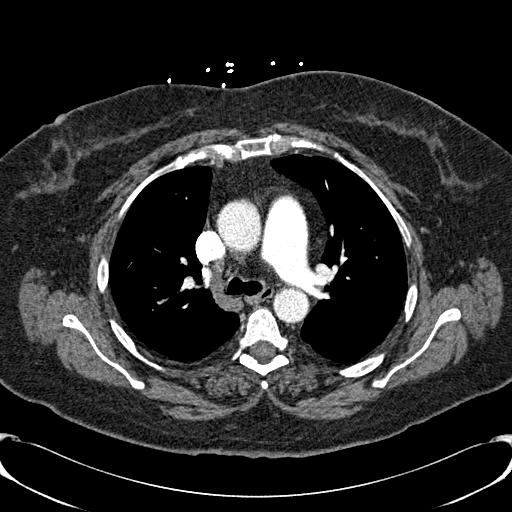
[im 183/281  lung]
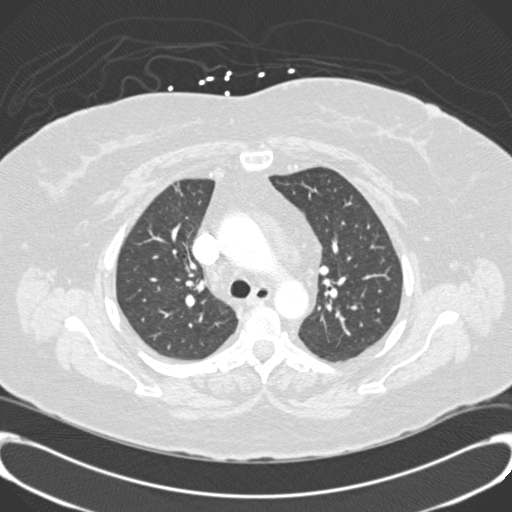
[im 195/281  soft-tissue]
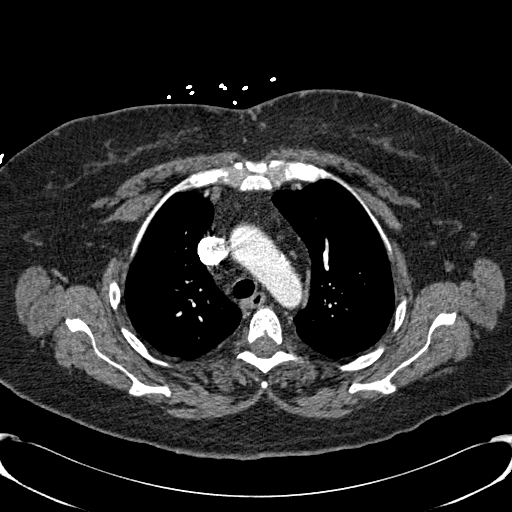
[im 220/281  lung]
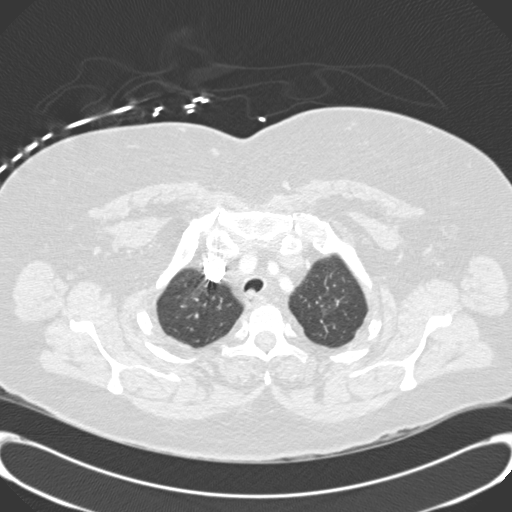
[im 232/281  soft-tissue]
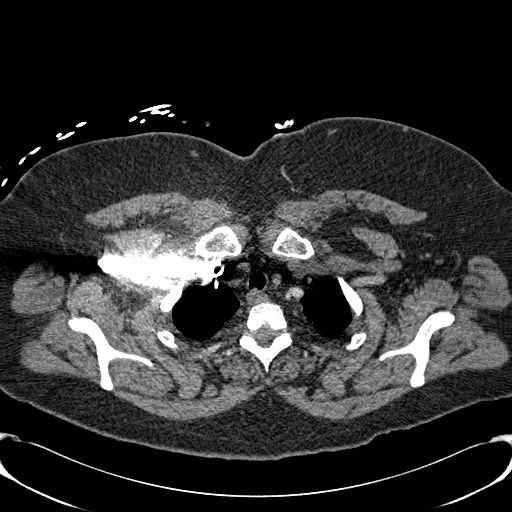
[im 244/281  lung]
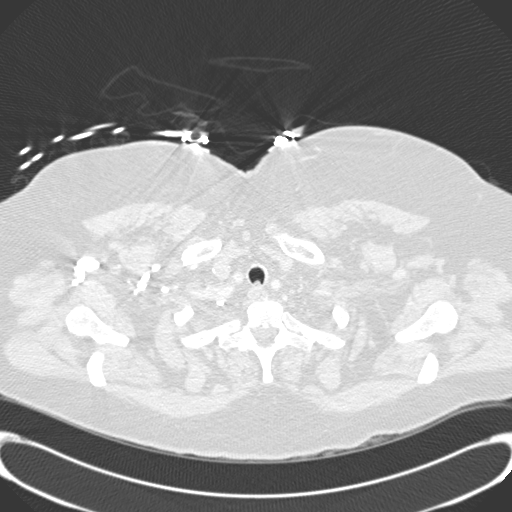
[im 268/281  soft-tissue]
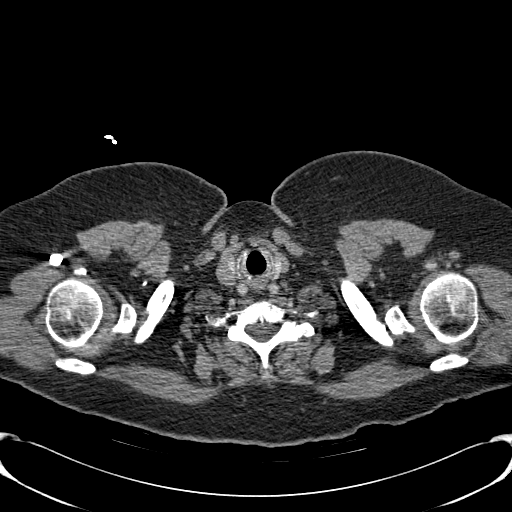

[Series 602: coronal · coronal · 0.74mm/px · 3 of 131 slices shown]
[im 33/131  soft-tissue]
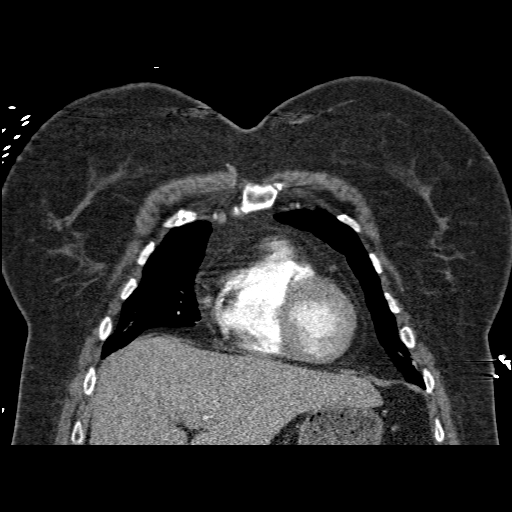
[im 66/131  soft-tissue]
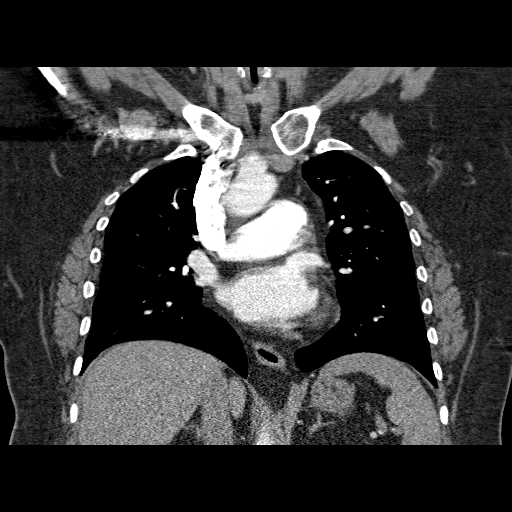
[im 98/131  soft-tissue]
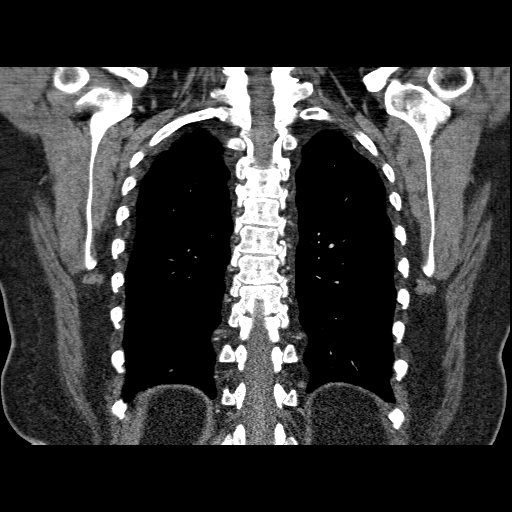

[19 of 46 positions shown; findings below may reference images not displayed]

FINDINGS: There is no evidence of pulmonary embolus.

Minimal atelectasis is noted within the right middle lobe.  The
lungs are otherwise clear.  There is no evidence of significant
focal consolidation, pleural effusion or pneumothorax.  No masses
are identified; no abnormal focal contrast enhancement is seen.

The mediastinum is unremarkable in appearance.  There is no
evidence of mediastinal lymphadenopathy.  No pericardial effusion
is identified.  The great vessels are unremarkable in appearance.
No axillary lymphadenopathy is seen.  The thyroid gland is
unremarkable in appearance.

The visualized portions of the liver and spleen are unremarkable.
Numerous small cysts are noted at the upper pole of the left
kidney; these are slightly more apparent than on the prior CTA.

No acute osseous abnormalities are seen.
IMPRESSION: 1.  No evidence of pulmonary embolus.
2.  Minimal atelectasis within the right middle lobe; lungs
otherwise clear.
3.  Numerous small left renal cysts noted, mildly more apparent
than on the prior CTA.

## 2013-10-25 ENCOUNTER — Other Ambulatory Visit: Payer: No Typology Code available for payment source

## 2013-10-27 ENCOUNTER — Ambulatory Visit (INDEPENDENT_AMBULATORY_CARE_PROVIDER_SITE_OTHER): Payer: No Typology Code available for payment source | Admitting: *Deleted

## 2013-10-27 DIAGNOSIS — E785 Hyperlipidemia, unspecified: Secondary | ICD-10-CM

## 2013-10-27 DIAGNOSIS — I2510 Atherosclerotic heart disease of native coronary artery without angina pectoris: Secondary | ICD-10-CM

## 2013-10-27 DIAGNOSIS — I10 Essential (primary) hypertension: Secondary | ICD-10-CM

## 2013-10-27 LAB — LIPID PANEL
Cholesterol: 217 mg/dL — ABNORMAL HIGH (ref 0–200)
HDL: 30 mg/dL — ABNORMAL LOW (ref >39)
LDL Cholesterol: 142 mg/dL — ABNORMAL HIGH (ref 0–99)
Total CHOL/HDL Ratio: 7.2 Ratio
Triglycerides: 223 mg/dL — ABNORMAL HIGH (ref <150)
VLDL: 45 mg/dL — AB (ref 0–40)

## 2013-10-27 LAB — HEPATIC FUNCTION PANEL
ALT: 17 U/L (ref 0–35)
AST: 19 U/L (ref 0–37)
Albumin: 3.6 g/dL (ref 3.5–5.2)
Alkaline Phosphatase: 42 U/L (ref 39–117)
BILIRUBIN TOTAL: 0.7 mg/dL (ref 0.2–1.2)
Bilirubin, Direct: 0.2 mg/dL (ref 0.0–0.3)
Indirect Bilirubin: 0.5 mg/dL (ref 0.2–1.2)
TOTAL PROTEIN: 5.7 g/dL — AB (ref 6.0–8.3)

## 2013-10-30 ENCOUNTER — Other Ambulatory Visit: Payer: Self-pay | Admitting: *Deleted

## 2013-10-30 ENCOUNTER — Other Ambulatory Visit: Payer: No Typology Code available for payment source

## 2013-10-30 DIAGNOSIS — E785 Hyperlipidemia, unspecified: Secondary | ICD-10-CM

## 2013-10-30 DIAGNOSIS — I2510 Atherosclerotic heart disease of native coronary artery without angina pectoris: Secondary | ICD-10-CM

## 2013-10-30 MED ORDER — ATORVASTATIN CALCIUM 80 MG PO TABS: 80.0000 mg | ORAL_TABLET | Freq: Every day | ORAL | Status: DC

## 2013-11-17 DIAGNOSIS — J449 Chronic obstructive pulmonary disease, unspecified: Secondary | ICD-10-CM | POA: Insufficient documentation

## 2013-11-17 DIAGNOSIS — J45909 Unspecified asthma, uncomplicated: Secondary | ICD-10-CM | POA: Insufficient documentation

## 2013-12-05 ENCOUNTER — Emergency Department (HOSPITAL_COMMUNITY): Payer: No Typology Code available for payment source

## 2013-12-05 ENCOUNTER — Encounter (HOSPITAL_COMMUNITY): Payer: Self-pay | Admitting: Emergency Medicine

## 2013-12-05 ENCOUNTER — Emergency Department (HOSPITAL_COMMUNITY)
Admission: EM | Admit: 2013-12-05 | Discharge: 2013-12-05 | Disposition: A | Discharge status: Home / Self Care | Payer: No Typology Code available for payment source | Attending: Emergency Medicine | Admitting: Emergency Medicine

## 2013-12-05 DIAGNOSIS — Z862 Personal history of diseases of the blood and blood-forming organs and certain disorders involving the immune mechanism: Secondary | ICD-10-CM | POA: Insufficient documentation

## 2013-12-05 DIAGNOSIS — R519 Headache, unspecified: Secondary | ICD-10-CM | POA: Insufficient documentation

## 2013-12-05 DIAGNOSIS — F329 Major depressive disorder, single episode, unspecified: Secondary | ICD-10-CM | POA: Insufficient documentation

## 2013-12-05 DIAGNOSIS — R51 Headache: Secondary | ICD-10-CM | POA: Insufficient documentation

## 2013-12-05 DIAGNOSIS — J4489 Other specified chronic obstructive pulmonary disease: Secondary | ICD-10-CM | POA: Insufficient documentation

## 2013-12-05 DIAGNOSIS — R4789 Other speech disturbances: Secondary | ICD-10-CM | POA: Insufficient documentation

## 2013-12-05 DIAGNOSIS — I2510 Atherosclerotic heart disease of native coronary artery without angina pectoris: Secondary | ICD-10-CM | POA: Insufficient documentation

## 2013-12-05 DIAGNOSIS — H538 Other visual disturbances: Secondary | ICD-10-CM | POA: Insufficient documentation

## 2013-12-05 DIAGNOSIS — K219 Gastro-esophageal reflux disease without esophagitis: Secondary | ICD-10-CM | POA: Insufficient documentation

## 2013-12-05 DIAGNOSIS — R29810 Facial weakness: Secondary | ICD-10-CM | POA: Insufficient documentation

## 2013-12-05 DIAGNOSIS — Z9861 Coronary angioplasty status: Secondary | ICD-10-CM | POA: Insufficient documentation

## 2013-12-05 DIAGNOSIS — R110 Nausea: Secondary | ICD-10-CM | POA: Insufficient documentation

## 2013-12-05 DIAGNOSIS — I10 Essential (primary) hypertension: Secondary | ICD-10-CM | POA: Insufficient documentation

## 2013-12-05 DIAGNOSIS — IMO0002 Reserved for concepts with insufficient information to code with codable children: Secondary | ICD-10-CM | POA: Insufficient documentation

## 2013-12-05 DIAGNOSIS — E785 Hyperlipidemia, unspecified: Secondary | ICD-10-CM | POA: Insufficient documentation

## 2013-12-05 DIAGNOSIS — M6281 Muscle weakness (generalized): Secondary | ICD-10-CM | POA: Insufficient documentation

## 2013-12-05 DIAGNOSIS — Z79899 Other long term (current) drug therapy: Secondary | ICD-10-CM | POA: Insufficient documentation

## 2013-12-05 DIAGNOSIS — J449 Chronic obstructive pulmonary disease, unspecified: Secondary | ICD-10-CM | POA: Insufficient documentation

## 2013-12-05 DIAGNOSIS — Z87891 Personal history of nicotine dependence: Secondary | ICD-10-CM | POA: Insufficient documentation

## 2013-12-05 DIAGNOSIS — R531 Weakness: Secondary | ICD-10-CM

## 2013-12-05 DIAGNOSIS — Z792 Long term (current) use of antibiotics: Secondary | ICD-10-CM | POA: Insufficient documentation

## 2013-12-05 LAB — RAPID URINE DRUG SCREEN, HOSP PERFORMED
Amphetamines: NOT DETECTED
Barbiturates: NOT DETECTED
Benzodiazepines: POSITIVE — AB
COCAINE: NOT DETECTED
OPIATES: NOT DETECTED
TETRAHYDROCANNABINOL: NOT DETECTED

## 2013-12-05 LAB — COMPREHENSIVE METABOLIC PANEL
ALK PHOS: 50 U/L (ref 39–117)
ALT: 12 U/L (ref 0–35)
AST: 16 U/L (ref 0–37)
Albumin: 3.6 g/dL (ref 3.5–5.2)
BILIRUBIN TOTAL: 0.3 mg/dL (ref 0.3–1.2)
BUN: 19 mg/dL (ref 6–23)
CHLORIDE: 104 meq/L (ref 96–112)
CO2: 29 mEq/L (ref 19–32)
CREATININE: 1.51 mg/dL — AB (ref 0.50–1.10)
Calcium: 9 mg/dL (ref 8.4–10.5)
GFR calc non Af Amer: 37 mL/min — ABNORMAL LOW (ref >90)
GFR, EST AFRICAN AMERICAN: 43 mL/min — AB (ref >90)
GLUCOSE: 94 mg/dL (ref 70–99)
Potassium: 3.9 mEq/L (ref 3.7–5.3)
Sodium: 145 mEq/L (ref 137–147)
Total Protein: 6.4 g/dL (ref 6.0–8.3)

## 2013-12-05 LAB — DIFFERENTIAL
BASOS PCT: 0 % (ref 0–1)
Basophils Absolute: 0 10*3/uL (ref 0.0–0.1)
EOS ABS: 0.3 10*3/uL (ref 0.0–0.7)
Eosinophils Relative: 3 % (ref 0–5)
Lymphocytes Relative: 31 % (ref 12–46)
Lymphs Abs: 3.2 10*3/uL (ref 0.7–4.0)
MONOS PCT: 5 % (ref 3–12)
Monocytes Absolute: 0.5 10*3/uL (ref 0.1–1.0)
NEUTROS ABS: 6.2 10*3/uL (ref 1.7–7.7)
Neutrophils Relative %: 61 % (ref 43–77)

## 2013-12-05 LAB — CBC
HEMATOCRIT: 37.9 % (ref 36.0–46.0)
HEMOGLOBIN: 12.5 g/dL (ref 12.0–15.0)
MCH: 32.6 pg (ref 26.0–34.0)
MCHC: 33 g/dL (ref 30.0–36.0)
MCV: 98.7 fL (ref 78.0–100.0)
Platelets: 289 10*3/uL (ref 150–400)
RBC: 3.84 MIL/uL — AB (ref 3.87–5.11)
RDW: 13.4 % (ref 11.5–15.5)
WBC: 10.2 10*3/uL (ref 4.0–10.5)

## 2013-12-05 LAB — URINALYSIS, ROUTINE W REFLEX MICROSCOPIC
BILIRUBIN URINE: NEGATIVE
GLUCOSE, UA: NEGATIVE mg/dL
HGB URINE DIPSTICK: NEGATIVE
Ketones, ur: NEGATIVE mg/dL
Nitrite: NEGATIVE
PH: 6 (ref 5.0–8.0)
Protein, ur: NEGATIVE mg/dL
SPECIFIC GRAVITY, URINE: 1.01 (ref 1.005–1.030)
UROBILINOGEN UA: 1 mg/dL (ref 0.0–1.0)

## 2013-12-05 LAB — I-STAT CHEM 8, ED
BUN: 19 mg/dL (ref 6–23)
Calcium, Ion: 1.18 mmol/L (ref 1.12–1.23)
Chloride: 99 mEq/L (ref 96–112)
Creatinine, Ser: 1.6 mg/dL — ABNORMAL HIGH (ref 0.50–1.10)
Glucose, Bld: 94 mg/dL (ref 70–99)
HCT: 40 % (ref 36.0–46.0)
Hemoglobin: 13.6 g/dL (ref 12.0–15.0)
Potassium: 3.7 mEq/L (ref 3.7–5.3)
Sodium: 142 mEq/L (ref 137–147)
TCO2: 31 mmol/L (ref 0–100)

## 2013-12-05 LAB — PROTIME-INR
INR: 0.9 (ref 0.00–1.49)
Prothrombin Time: 12 seconds (ref 11.6–15.2)

## 2013-12-05 LAB — CBG MONITORING, ED: GLUCOSE-CAPILLARY: 94 mg/dL (ref 70–99)

## 2013-12-05 LAB — URINE MICROSCOPIC-ADD ON

## 2013-12-05 LAB — ETHANOL

## 2013-12-05 LAB — I-STAT TROPONIN, ED: TROPONIN I, POC: 0 ng/mL (ref 0.00–0.08)

## 2013-12-05 LAB — APTT: aPTT: 27 seconds (ref 24–37)

## 2013-12-05 MED ORDER — PROCHLORPERAZINE EDISYLATE 5 MG/ML IJ SOLN
10.0000 mg | Freq: Once | INTRAMUSCULAR | Status: AC
  Administered 2013-12-05: 10 mg via INTRAVENOUS
  Filled 2013-12-05: qty 2

## 2013-12-05 MED ORDER — FENTANYL CITRATE 0.05 MG/ML IJ SOLN
50.0000 ug | Freq: Once | INTRAMUSCULAR | Status: AC
  Administered 2013-12-05: 50 ug via INTRAVENOUS
  Filled 2013-12-05: qty 2

## 2013-12-05 NOTE — Consult Note (Signed)
Referring Physician: Alvino Chapel    Chief Complaint: Code stroke  HPI:                                                                                                                                         [name] is an 58 y.o. female who noted yesterday to have a HA on the left aspect of her head. As her HA increased into the evening she noted decreased vision in her right eye (only right eye).  Patient awoke with continued HA and right eye blurred vision.  At 0800 she noted she could not open her mouth and unable to eat a biscuit. At 1500 hours she noted she was unable to put her make up on well and right arm weakness.  Patient came to ED where code stroke was called.  Currently she is still having HA, right arm and leg weakness, blurred vision in her right eye (only). Initial CT was negative.   Date last known well: Date: 12/04/2013 Time last known well: Time: 15:00 tPA Given: No: out of window NIHSS-4  Past Medical History  Diagnosis Date  . Anemia, unspecified   . Major depression   . Narcotic abuse in remission   . Bipolar disorder, unspecified   . Dissociative identity disorder   . HTN (hypertension), benign     a. meds d/c'd by PCP 11/13 due to low BP in setting of significant weight loss and poor appetite related to esophagitis (Toprol restarted late Nov 2013)  . HLD (hyperlipidemia)   . GERD (gastroesophageal reflux disease)   . CAD (coronary artery disease)     a. Cypher DES to mLAD in 2007; b. MV 3/09: EF 72%, no ischemia/infarction.; c. MV (12/11) EF 73%, ? Ant soft tissue atten, no ischemia/infarction; d. LHC (1/12): patent LAD stent, mild LIs only; e. Lex MV (6/13): no ischemia or infarction.   . Hiatal hernia   . COPD (chronic obstructive pulmonary disease)     a. quit smoking 06/2010; b. PFTs (4/10):  FEV1 66%, ratio 64%, DLCO 67%  . Palpitations     a. Holter (12/11) with occasional PVCs. Holter (11/12): rare PACs and PVCs.  Marland Kitchen Hx of echocardiogram     a.  Echo (1/08): EF 65%, no significant valvular abnormalities.; b. Echo (12/11): EF 55-60%, grade II diast dysfn, normal wall motion, mild LAE.; c. Echo (5/13) with EF 55-60%, mild LVH.   Marland Kitchen Esophageal stricture     a. s/p dilatation 7/12;  b. EGD 04/2012 with severe esophagitis and stricture (PPI started and dilatation deferred for several weeks)  . H/O partial nephrectomy 1997  . H/O orthostatic hypotension     probably related to psych meds  . Shortness of breath   . Dysphagia     Past Surgical History  Procedure Laterality Date  . Cesarean section    . Bladder surgery  Tack  . Nephrectomy      Part of left kidney  . Carpal tunnel release      BILATERAL HANDS  . Coronary stent placement  2007  . Abdominal hysterectomy      Family History  Problem Relation Age of Onset  . Emphysema Mother   . Ovarian cancer Mother   . Heart disease Father     mi age 45  . Heart attack Sister 6  . Heart attack Brother 2  . Colon cancer Neg Hx   . Stroke Maternal Grandmother    Social History:  reports that she quit smoking about 3 years ago. Her smoking use included Cigarettes. She smoked 0.00 packs per day for 40 years. She has never used smokeless tobacco. She reports that she does not drink alcohol or use illicit drugs.  Allergies:  Allergies  Allergen Reactions  . Trazodone Hcl Anaphylaxis  . Cefpodoxime Proxetil Other (See Comments)    unknown  . Erythromycin Ethylsuccinate Nausea And Vomiting  . Perphenazine Other (See Comments)    unknown    Medications:                                                                                                                           No current facility-administered medications for this encounter.   Current Outpatient Prescriptions  Medication Sig Dispense Refill  . albuterol (PROVENTIL HFA;VENTOLIN HFA) 108 (90 BASE) MCG/ACT inhaler Inhale 2 puffs into the lungs every 6 (six) hours.      Marland Kitchen aspirin EC 81 MG tablet Take 81 mg by  mouth daily.      Marland Kitchen atorvastatin (LIPITOR) 80 MG tablet Take 1 tablet (80 mg total) by mouth daily.  30 tablet  3  . clonazePAM (KLONOPIN) 1 MG tablet Take 1 mg by mouth 3 (three) times daily as needed.       . clonazePAM (KLONOPIN) 2 MG tablet Take 2 mg by mouth at bedtime.      Marland Kitchen doxycycline (MONODOX) 100 MG capsule Take 100 mg by mouth 2 (two) times daily.       . DULoxetine (CYMBALTA) 60 MG capsule Take 60 mg by mouth daily.       . fenofibrate 160 MG tablet Take 160 mg by mouth daily.       Marland Kitchen labetalol (NORMODYNE) 100 MG tablet Take 100 mg by mouth 2 (two) times daily.       Marland Kitchen lisinopril (PRINIVIL,ZESTRIL) 10 MG tablet Take 5 mg by mouth daily.       . mometasone-formoterol (DULERA) 200-5 MCG/ACT AERO Inhale 2 puffs into the lungs 2 (two) times daily.      . nitroGLYCERIN (NITROSTAT) 0.4 MG SL tablet Place 0.4 mg under the tongue every 5 (five) minutes as needed for chest pain.      . Omega-3 Fatty Acids (FISH OIL) 300 MG CAPS Take 360 mg by mouth 2 (two) times daily.      Marland Kitchen omeprazole (  PRILOSEC) 40 MG capsule TAKE 1 CAPSULE BY MOUTH TWICE DAILY  60 capsule  3  . potassium chloride SA (K-DUR,KLOR-CON) 20 MEQ tablet Take 20 mEq by mouth 2 (two) times daily.      Marland Kitchen Propylene Glycol (SYSTANE BALANCE OP) Apply to eye.      . spironolactone (ALDACTONE) 25 MG tablet Take 0.5 tablets (12.5 mg total) by mouth daily.  30 tablet  3  . torsemide (DEMADEX) 10 MG tablet Take 10 mg by mouth once.       . ziprasidone (GEODON) 80 MG capsule Take 80 mg by mouth 2 (two) times daily with a meal.         ROS:                                                                                                                                       History obtained from the patient  General ROS: negative for - chills, fatigue, fever, night sweats, weight gain or weight loss Psychological ROS: negative for - behavioral disorder, hallucinations, memory difficulties, mood swings or suicidal ideation Ophthalmic ROS:  negative for - blurry vision, double vision, eye pain or loss of vision ENT ROS: negative for - epistaxis, nasal discharge, oral lesions, sore throat, tinnitus or vertigo Allergy and Immunology ROS: negative for - hives or itchy/watery eyes Hematological and Lymphatic ROS: negative for - bleeding problems, bruising or swollen lymph nodes Endocrine ROS: negative for - galactorrhea, hair pattern changes, polydipsia/polyuria or temperature intolerance Respiratory ROS: negative for - cough, hemoptysis, shortness of breath or wheezing Cardiovascular ROS: negative for - chest pain, dyspnea on exertion, edema or irregular heartbeat Gastrointestinal ROS: negative for - abdominal pain, diarrhea, hematemesis, nausea/vomiting or stool incontinence Genito-Urinary ROS: negative for - dysuria, hematuria, incontinence or urinary frequency/urgency Musculoskeletal ROS: negative for - joint swelling or muscular weakness Neurological ROS: as noted in HPI Dermatological ROS: negative for rash and skin lesion changes  Neurologic Examination:                                                                                                      Blood pressure 163/94, pulse 80, temperature 98 F (36.7 C), temperature source Oral, resp. rate 18, SpO2 95.00%.   Mental Status: Alert, oriented, thought content appropriate.  Speech fluent without evidence of aphasia.  Able to follow 3 step commands without difficulty. Cranial Nerves: II: Discs flat bilaterally; Visual fields grossly normal, pupils equal, round, reactive to light and accommodation III,IV, VI:  ptosis not present, extra-ocular motions intact bilaterally V,VII: smile asymmetric on the right (left face noted to be drawn up), facial light touch sensation decreased on the right VIII: hearing normal bilaterally IX,X: gag reflex present XI: bilateral shoulder shrug XII: midline tongue extension without atrophy or fasciculations  Motor: Able to move all  extremities antigravity. Slight drift noted on the right arm and leg however this was inconsistent.  Sensory: Pinprick and light touch decreased on the right arm and leg Deep Tendon Reflexes:  Right: Upper Extremity   Left: Upper extremity   biceps (C-5 to C-6) 2/4   biceps (C-5 to C-6) 2/4 tricep (C7) 2/4    triceps (C7) 2/4 Brachioradialis (C6) 2/4  Brachioradialis (C6) 2/4  Lower Extremity Lower Extremity  quadriceps (L-2 to L-4) 2/4   quadriceps (L-2 to L-4) 2/4 Achilles (S1) 1/4   Achilles (S1) 1/4  Plantars: Right: downgoing   Left: downgoing Cerebellar: normal finger-to-nose,  normal heel-to-shin test Gait: not tested CV: pulses palpable throughout    Lab Results: Basic Metabolic Panel:  Recent Labs Lab 12/05/13 1646  NA 142  K 3.7  CL 99  GLUCOSE 94  BUN 19  CREATININE 1.60*    Liver Function Tests: No results found for this basename: AST, ALT, ALKPHOS, BILITOT, PROT, ALBUMIN,  in the last 168 hours No results found for this basename: LIPASE, AMYLASE,  in the last 168 hours No results found for this basename: AMMONIA,  in the last 168 hours  CBC:  Recent Labs Lab 12/05/13 1640 12/05/13 1646  WBC 10.2  --   NEUTROABS 6.2  --   HGB 12.5 13.6  HCT 37.9 40.0  MCV 98.7  --   PLT 289  --     Cardiac Enzymes: No results found for this basename: CKTOTAL, CKMB, CKMBINDEX, TROPONINI,  in the last 168 hours  Lipid Panel: No results found for this basename: CHOL, TRIG, HDL, CHOLHDL, VLDL, LDLCALC,  in the last 168 hours  CBG:  Recent Labs Lab 12/05/13 [place] 94    Microbiology: Results for orders placed in visit on 43/15/40  HELICOBACTER PYLORI SCREEN-BIOPSY     Status: None   Collection Time    05/24/12  8:57 AM      Result Value Ref Range Status   UREASE Negative  Negative Final    Coagulation Studies: No results found for this basename: LABPROT, INR,  in the last 72 hours  Imaging: Ct Head Wo Contrast  12/05/2013   CLINICAL DATA:   HEADACHE STROKE SYMPTOMS  EXAM: CT HEAD WITHOUT CONTRAST  TECHNIQUE: Contiguous axial images were obtained from the base of the skull through the vertex without intravenous contrast.  COMPARISON:  CT HEAD W/O CM dated 07/16/2012  FINDINGS: No acute intracranial abnormality. Specifically, no hemorrhage, hydrocephalus, mass lesion, acute infarction, or significant intracranial injury. No acute calvarial abnormality. The orbits are stable when compared to previous study.  IMPRESSION: No acute intracranial abnormality. These results were called by telephone at the time of interpretation on 12/05/2013 at 4:59 PM to [facility], who verbally acknowledged these results.   Electronically Signed   By: Margaree Mackintosh M.D.   On: 12/05/2013 17:00    Etta Quill PA-C [facility] [phone]  12/05/2013, 5:09 PM   Patient seen and examined.  Clinical course and management discussed.  Necessary edits performed.  I agree with the above.  Assessment and plan of care developed and discussed below.     Assessment: 57  y.o. female presenting with headache and right sided weakness. Neurological examination has many functional features.  Patient also outside of tPA treatment window since symptoms were present to some degree with breakfast this morning at 8AM.  Head CT reviewed and shows no acute changes.  Stroke Risk Factors - hyperlipidemia and hypertension  Recommendations: 1.  Analgesia for headache 2.  MRI of the brain without contrast.  Would not initiate a stroke work up unless MR is diagnostic of an acute ischemic event.  3.  Continue ASA daily  Case discussed with Dr. Mordecai Maes, MD [facility] [phone]  12/05/2013  5:39 PM

## 2013-12-05 NOTE — ED Provider Notes (Signed)
CSN: [id number]     Arrival date & time 12/05/13  1625 History   First MD Initiated Contact with Patient 12/05/13 1640     Chief Complaint  Patient presents with  . Code Stroke  . Stroke Symptoms     (Consider location/radiation/quality/duration/timing/severity/associated sxs/prior Treatment) The history is provided by the patient.   patient's had a headache since yesterday afternoon. It is on the left side of her head. As dull. She states that around one hour prior to arrival she developed difficulty speaking, facial droop, and weakness on her right side. She was then taken to the ER. She's had mild nausea without vomiting. To light and sound bother her somewhat. She had no history of headaches. No fevers. No cough. No numbness or weakness. She states she does have some blurred vision on her right eye. No chest pain. No trouble breathing.  Past Medical History  Diagnosis Date  . Anemia, unspecified   . Major depression   . Narcotic abuse in remission   . Bipolar disorder, unspecified   . Dissociative identity disorder   . HTN (hypertension), benign     a. meds d/c'd by PCP 11/13 due to low BP in setting of significant weight loss and poor appetite related to esophagitis (Toprol restarted late Nov 2013)  . HLD (hyperlipidemia)   . GERD (gastroesophageal reflux disease)   . CAD (coronary artery disease)     a. Cypher DES to mLAD in 2007; b. MV 3/09: EF 72%, no ischemia/infarction.; c. MV (12/11) EF 73%, ? Ant soft tissue atten, no ischemia/infarction; d. LHC (1/12): patent LAD stent, mild LIs only; e. Lex MV (6/13): no ischemia or infarction.   . Hiatal hernia   . COPD (chronic obstructive pulmonary disease)     a. quit smoking 06/2010; b. PFTs (4/10):  FEV1 66%, ratio 64%, DLCO 67%  . Palpitations     a. Holter (12/11) with occasional PVCs. Holter (11/12): rare PACs and PVCs.  Marland Kitchen Hx of echocardiogram     a. Echo (1/08): EF 65%, no significant valvular abnormalities.; b. Echo (12/11):  EF 55-60%, grade II diast dysfn, normal wall motion, mild LAE.; c. Echo (5/13) with EF 55-60%, mild LVH.   Marland Kitchen Esophageal stricture     a. s/p dilatation 7/12;  b. EGD 04/2012 with severe esophagitis and stricture (PPI started and dilatation deferred for several weeks)  . H/O partial nephrectomy 1997  . H/O orthostatic hypotension     probably related to psych meds  . Shortness of breath   . Dysphagia    Past Surgical History  Procedure Laterality Date  . Cesarean section    . Bladder surgery      Tack  . Nephrectomy      Part of left kidney  . Carpal tunnel release      BILATERAL HANDS  . Coronary stent placement  2007  . Abdominal hysterectomy     Family History  Problem Relation Age of Onset  . Emphysema Mother   . Ovarian cancer Mother   . Heart disease Father     mi age 87  . Heart attack Sister 67  . Heart attack Brother 24  . Colon cancer Neg Hx   . Stroke Maternal Grandmother    History  Substance Use Topics  . Smoking status: Former Smoker -- 40 years    Types: Cigarettes    Quit date: 07/07/2010  . Smokeless tobacco: Never Used     Comment: Pt states she does  smoke an electronic cigarette.  . Alcohol Use: No   OB History   Grav Para Term Preterm Abortions TAB SAB Ect Mult Living                 Review of Systems  Constitutional: Negative for activity change and appetite change.  Eyes: Positive for visual disturbance. Negative for pain.  Respiratory: Negative for chest tightness and shortness of breath.   Cardiovascular: Negative for chest pain and leg swelling.  Gastrointestinal: Positive for nausea. Negative for vomiting, abdominal pain and diarrhea.  Genitourinary: Negative for flank pain.  Musculoskeletal: Negative for back pain and neck stiffness.  Skin: Negative for rash.  Neurological: Positive for weakness and headaches. Negative for numbness.  Psychiatric/Behavioral: Negative for behavioral problems.      Allergies  Trazodone hcl;  Erythromycin ethylsuccinate; Cefpodoxime proxetil; and Perphenazine  Home Medications   Prior to Admission medications   Medication Sig Start Date End Date Taking? Authorizing Provider  albuterol (PROVENTIL HFA;VENTOLIN HFA) 108 (90 BASE) MCG/ACT inhaler Inhale 2 puffs into the lungs every 6 (six) hours.    Historical Provider, MD  aspirin EC 81 MG tablet Take 81 mg by mouth daily.    Historical Provider, MD  atorvastatin (LIPITOR) 80 MG tablet Take 1 tablet (80 mg total) by mouth daily. 10/30/13   Larey Dresser, MD  clonazePAM (KLONOPIN) 1 MG tablet Take 1 mg by mouth 3 (three) times daily as needed.     Historical Provider, MD  clonazePAM (KLONOPIN) 2 MG tablet Take 2 mg by mouth at bedtime.    Historical Provider, MD  doxycycline (MONODOX) 100 MG capsule Take 100 mg by mouth 2 (two) times daily.  08/17/13   Historical Provider, MD  DULoxetine (CYMBALTA) 60 MG capsule Take 60 mg by mouth daily.     Historical Provider, MD  fenofibrate 160 MG tablet Take 160 mg by mouth daily.  07/15/13   Historical Provider, MD  labetalol (NORMODYNE) 100 MG tablet Take 100 mg by mouth 2 (two) times daily.  08/03/13   Historical Provider, MD  lisinopril (PRINIVIL,ZESTRIL) 10 MG tablet Take 5 mg by mouth daily.  06/19/13   Historical Provider, MD  mometasone-formoterol (DULERA) 200-5 MCG/ACT AERO Inhale 2 puffs into the lungs 2 (two) times daily.    Historical Provider, MD  nitroGLYCERIN (NITROSTAT) 0.4 MG SL tablet Place 0.4 mg under the tongue every 5 (five) minutes as needed for chest pain.    Historical Provider, MD  Omega-3 Fatty Acids (FISH OIL) 300 MG CAPS Take 360 mg by mouth 2 (two) times daily.    Historical Provider, MD  omeprazole (PRILOSEC) 40 MG capsule TAKE 1 CAPSULE BY MOUTH TWICE DAILY 10/17/13   Irene Shipper, MD  potassium chloride SA (K-DUR,KLOR-CON) 20 MEQ tablet Take 20 mEq by mouth 2 (two) times daily. 04/24/13   Burtis Junes, NP  Propylene Glycol (SYSTANE BALANCE OP) Apply to eye.     Historical Provider, MD  spironolactone (ALDACTONE) 25 MG tablet Take 0.5 tablets (12.5 mg total) by mouth daily. 03/10/13   Burtis Junes, NP  torsemide (DEMADEX) 10 MG tablet Take 10 mg by mouth once.  06/19/13   Historical Provider, MD  ziprasidone (GEODON) 80 MG capsule Take 80 mg by mouth 2 (two) times daily with a meal.    Historical Provider, MD   BP 150/64  Pulse 70  Temp(Src) 98 F (36.7 C) (Oral)  Resp 18  SpO2 94% Physical Exam  Constitutional: She is  oriented to person, place, and time. She appears well-developed and well-nourished.  HENT:  Head: Atraumatic.  Eyes: EOM are normal. Pupils are equal, round, and reactive to light.  Neck: Neck supple.  Cardiovascular: Normal rate and regular rhythm.   Pulmonary/Chest: Effort normal and breath sounds normal.  Neurological: She is alert and oriented to person, place, and time.  Possible right-sided facial droop. Extraocular movements intact. Visual fields grossly intact confrontation. Decreased use of right arm, but appears to be somewhat effort dependent. She would not raise her arm much for strength testing, but on finger nose testing she raised up freely. Slightly decreased strength in right leg, may also be effort dependent.  Skin: Skin is warm.    ED Course  Procedures (including critical care time) Labs Review Labs Reviewed  CBC - Abnormal; Notable for the following:    RBC 3.84 (*)    All other components within normal limits  COMPREHENSIVE METABOLIC PANEL - Abnormal; Notable for the following:    Creatinine, Ser 1.51 (*)    GFR calc non Af Amer 37 (*)    GFR calc Af Amer 43 (*)    All other components within normal limits  URINE RAPID DRUG SCREEN (HOSP PERFORMED) - Abnormal; Notable for the following:    Benzodiazepines POSITIVE (*)    All other components within normal limits  URINALYSIS, ROUTINE W REFLEX MICROSCOPIC - Abnormal; Notable for the following:    APPearance CLOUDY (*)    Leukocytes, UA LARGE (*)     All other components within normal limits  URINE MICROSCOPIC-ADD ON - Abnormal; Notable for the following:    Squamous Epithelial / LPF MANY (*)    All other components within normal limits  I-STAT CHEM 8, ED - Abnormal; Notable for the following:    Creatinine, Ser 1.60 (*)    All other components within normal limits  ETHANOL  PROTIME-INR  APTT  DIFFERENTIAL  CBG MONITORING, ED  I-STAT TROPOININ, ED  I-STAT TROPOININ, ED    Imaging Review Ct Head Wo Contrast  12/05/2013   CLINICAL DATA:  HEADACHE STROKE SYMPTOMS  EXAM: CT HEAD WITHOUT CONTRAST  TECHNIQUE: Contiguous axial images were obtained from the base of the skull through the vertex without intravenous contrast.  COMPARISON:  CT HEAD W/O CM dated 07/16/2012  FINDINGS: No acute intracranial abnormality. Specifically, no hemorrhage, hydrocephalus, mass lesion, acute infarction, or significant intracranial injury. No acute calvarial abnormality. The orbits are stable when compared to previous study.  IMPRESSION: No acute intracranial abnormality. These results were called by telephone at the time of interpretation on 12/05/2013 at 4:59 PM to [facility], who verbally acknowledged these results.   Electronically Signed   By: Margaree Mackintosh M.D.   On: 12/05/2013 17:00   Mr Brain Wo Contrast  12/05/2013   CLINICAL DATA:  Headache with decreased vision right eye.  Stroke.  EXAM: MRI HEAD WITHOUT CONTRAST  TECHNIQUE: Multiplanar, multiecho pulse sequences of the brain and surrounding structures were obtained without intravenous contrast.  COMPARISON:  CT 12/05/2013  FINDINGS: Image quality degraded by mild motion.  Negative for acute infarct.  No significant chronic ischemic change. Negative for hemorrhage or mass. Ventricle size is normal. No shift of the midline structures. Brainstem and cerebellum are normal.  Pituitary is normal in size. Craniocervical junction normal. No lesions in the calvarium.  Paranasal sinuses are clear.   IMPRESSION: Negative   Electronically Signed   By: Franchot Gallo M.D.   On: 12/05/2013 19:37  EKG Interpretation   Date/Time:  Tuesday Dec 05 2013 16:35:01 EDT Ventricular Rate:  78 PR Interval:  142 QRS Duration: 90 QT Interval:  436 QTC Calculation: 497 R Axis:   14 Text Interpretation:  Normal sinus rhythm Prolonged QT Abnormal ECG  Confirmed by Alvino Chapel  MD, Ovid Curd [phone]) on 12/05/2013 5:32:13 PM      MDM   Final diagnoses:  Headache(784.0)  Right sided weakness    Patient with headache and right-sided weakness. Seen by neurology. This was not  complicated migraine or stroke. Patient has had some improvement in her exam. She was able ambulate without difficulty. MRI was done and did not show infarct. She had some continued weakness. Doubt CVA. Note given for headache after Compazine didn't little to improve her. Patient will be discharged home    Jasper Riling. Alvino Chapel, MD 12/05/13 2223

## 2013-12-05 NOTE — ED Notes (Signed)
Pt started having left sided headache yesterday at 1500 and headache has been continuous.  Pt developed right sided weakness, facial droop at about 1530.  PT complains of 10/10 headache.  Dr. Alvino Chapel spoke with patient and wants to call code stroke.  Code stroke called at 1639.  EDP bedside 1637.  Labs drawn 1636.

## 2013-12-05 NOTE — Discharge Instructions (Signed)

## 2013-12-05 NOTE — ED Notes (Signed)
Pt ambulatory down hall to restroom and back without difficulty.

## 2013-12-12 ENCOUNTER — Ambulatory Visit (INDEPENDENT_AMBULATORY_CARE_PROVIDER_SITE_OTHER)
Admission: RE | Admit: 2013-12-12 | Discharge: 2013-12-12 | Disposition: A | Discharge status: Home / Self Care | Payer: No Typology Code available for payment source | Source: Ambulatory Visit | Attending: Pulmonary Disease | Admitting: Pulmonary Disease

## 2013-12-12 ENCOUNTER — Ambulatory Visit (INDEPENDENT_AMBULATORY_CARE_PROVIDER_SITE_OTHER): Payer: No Typology Code available for payment source | Admitting: Pulmonary Disease

## 2013-12-12 ENCOUNTER — Encounter: Payer: Self-pay | Admitting: Pulmonary Disease

## 2013-12-12 ENCOUNTER — Other Ambulatory Visit: Payer: No Typology Code available for payment source

## 2013-12-12 ENCOUNTER — Telehealth: Payer: Self-pay | Admitting: Pulmonary Disease

## 2013-12-12 VITALS — BP 130/88 | HR 70 | Temp 97.8°F | Ht 65.5 in | Wt 234.6 lb

## 2013-12-12 DIAGNOSIS — J449 Chronic obstructive pulmonary disease, unspecified: Secondary | ICD-10-CM

## 2013-12-12 DIAGNOSIS — R0989 Other specified symptoms and signs involving the circulatory and respiratory systems: Secondary | ICD-10-CM

## 2013-12-12 DIAGNOSIS — R0609 Other forms of dyspnea: Secondary | ICD-10-CM

## 2013-12-12 DIAGNOSIS — R0602 Shortness of breath: Secondary | ICD-10-CM | POA: Insufficient documentation

## 2013-12-12 NOTE — Assessment & Plan Note (Signed)
The patient has significant dyspnea on exertion that is obviously multifactorial. She has moderate COPD, and is obese with deconditioning, and has worsening lower extremity edema probably related to diastolic dysfunction. We will treat her lung disease aggressively, but she is going to have to help with weight loss and conditioning, she will also have to have better diuresis in order to improve her diastolic dysfunction.

## 2013-12-12 NOTE — Assessment & Plan Note (Signed)
The patient has had a significant worsening of her FEV1 since testing in 2010, and I suspect this is secondary to her ongoing smoking after her PFTs in 2010.  At this point, I would like to maintain her on a LABA/ICS, but at a lower dose. Will also add anti-cholinergic therapy to see if this will help her breathing. She has not had a recent chest x-ray, and we'll therefore do this. Will also check an alpha-1 antitrypsin level given her significant drop in FEV1 at a fairly young age.  I have also stressed to her the importance of aggressive weight loss and some type of conditioning program. I offered to refer her to the pulmonary rehabilitation program at [facility], and she will consider this. Finally, I noted that she is on an ACE inhibitor with a chronic dry cough, and is also on a nonselective beta blocker which can influence her obstructive lung disease. I will send a note to her primary physician to consider getting her off these medications.

## 2013-12-12 NOTE — Patient Instructions (Addendum)
Will check chest xray today, and call you with results. Will send a note to your primary doctor to look at your blood pressure medication and see if it is interacting with your breathing issues. Decrease dulera to the 100/5 strength, and take 2 puffs am and pm everyday.  Keep mouth rinsed. Will try on spiriva respimat 2 inhalations every am.  Stop atrovent nebs, but can use albuterol as needed for rescue.  Work on weight loss and some type of conditioning program.  Can refer to pulmonary rehab program at [facility].  Just let me know Will check blood test for hereditary form of emphysema, and call you with results. followup with me again in 4 weeks.

## 2013-12-12 NOTE — Progress Notes (Signed)
   Subjective:    Patient ID: [name], female    DOB: 04/23/1956, 58 y.o.   MRN: [id number]  HPI The patient is a 58 year old female who I've been asked to see for COPD and worsening dyspnea on exertion. I had seen the patient back in 2010, and spirometry at that time showed only mild airflow obstruction. She was maintained on a LABA/ICS, and asked to work on smoking cessation and weight loss. I have not seen her since that time. She comes in today where she has had worsening shortness of breath over the last one year, and tells me that she has been in the hospital twice in the last 2 years for COPD exacerbation. She currently is not smoking, but is using a vapor device. She gives a one block or less history of dyspnea on exertion at a moderate pace, and we'll get winded bringing groceries in from the car. She will get short of breath walking up one flight of stairs and doing light housework. She has a dry cough which is chronic, and denies any chest congestion or mucus production. She does have lower extremity edema, and has a history of diastolic dysfunction and newly diagnosed chronic kidney disease. She tells me that her weight has increased over 30 pounds in the last one year. She currently is on high-dose dulera, as well as nebulized Atrovent.  She does not feel that her inhalers are helping her at this time.  It should be noted the patient is on an ACE inhibitor and nonselective beta blocker.   Review of Systems  Constitutional: Negative for fever and unexpected weight change.  HENT: Positive for congestion and postnasal drip. Negative for dental problem, ear pain, nosebleeds, rhinorrhea, sinus pressure, sneezing, sore throat and trouble swallowing.   Eyes: Negative for redness and itching.  Respiratory: Positive for cough and shortness of breath. Negative for chest tightness and wheezing.   Cardiovascular: Negative for palpitations and leg swelling.  Gastrointestinal: Negative for  nausea and vomiting.  Genitourinary: Negative for dysuria.  Musculoskeletal: Negative for joint swelling.  Skin: Negative for rash.  Neurological: Negative for headaches.  Hematological: Does not bruise/bleed easily.  Psychiatric/Behavioral: Negative for dysphoric mood. The patient is not nervous/anxious.        Objective:   Physical Exam Constitutional:  Overweight female, no acute distress  HENT:  Nares patent without discharge  Oropharynx without exudate, palate and uvula are normal  Eyes:  Perrla, eomi, no scleral icterus  Neck:  No JVD, no TMG  Cardiovascular:  Normal rate, regular rhythm, no rubs or gallops.  2/6 sem        Intact distal pulses but diminished  Pulmonary :  Normal breath sounds, no stridor or respiratory distress   No rales, rhonchi, or wheezing  Abdominal:  Soft, nondistended, bowel sounds present.  No tenderness noted.   Musculoskeletal:  1+ lower extremity edema noted.  Lymph Nodes:  No cervical lymphadenopathy noted  Skin:  No cyanosis noted  Neurologic:  Alert, appropriate, moves all 4 extremities without obvious deficit.         Assessment & Plan:

## 2013-12-12 NOTE — Telephone Encounter (Signed)
[name], please fax my consult note to this pt's primary md and make sure they receive.  Thank you.

## 2013-12-13 NOTE — Telephone Encounter (Signed)
OV note faxed and I verified it was received. Latham Bing, CMA

## 2013-12-21 ENCOUNTER — Telehealth: Payer: Self-pay | Admitting: Pulmonary Disease

## 2013-12-21 LAB — ALPHA-1 ANTITRYPSIN PHENOTYPE: A-1 Antitrypsin: 161 mg/dL (ref 83–199)

## 2013-12-21 MED ORDER — ALBUTEROL SULFATE HFA 108 (90 BASE) MCG/ACT IN AERS: 2.0000 | INHALATION_SPRAY | Freq: Four times a day (QID) | RESPIRATORY_TRACT | Status: AC | PRN

## 2013-12-21 NOTE — Telephone Encounter (Signed)
Pt calling with a few questions/concerns:  (1)  Pt reports that since starting Spiriva her breathing and cough has worsened. Pt states that she has been rinsing her mouth after each use as well. Wants to know what she should be doing different? What can help decrease her cough?  (2)  Pt requesting a refill of her albuterol hfa--initially filled by her PCP--wants to know if this can be filled by [facility] from here on out.   (3)  Pt states that Ruthe Mannan is too expensive - $266/mo.  Pt not sure what is covered. Pt to contact insurance company for alternatives. Please advise on the alternatives that patient can use in place of [facility].   Please advise Dr Gwenette Greet, thanks.

## 2013-12-21 NOTE — Telephone Encounter (Signed)
Called and spoke with pt and she is aware of [place] recs.   Pt will stop the spiriva. Albuterol refill was sent to her pharmacy and pt is aware to not overuse this. PCP stopped the lisinopril but pt is still take the labetalol--stated that she is doing very well on this and did not want to stop it.  Pt will call her insurance company to see which medication is covered---breo, advair or symibicort.  Pt to call back with this information. Will forward to [facility] as an FYI and sign off of message.

## 2013-12-21 NOTE — Telephone Encounter (Signed)
1) can stop spiriva if she feels is not helping her.  2) we can refill her albuterol, but she needs to avoid overusing.  No more than one inhaler a month 3) make sure she is not smoking cigarettes 4) make sure her primary md has taken her off the ace inhibitor and the nonselective beta blocker .  If she is sill on this, can cause cough and sob. 5) alternatives to dulera include breo, advair, symbicort.

## 2013-12-22 ENCOUNTER — Telehealth: Payer: Self-pay | Admitting: Pulmonary Disease

## 2013-12-22 NOTE — Telephone Encounter (Signed)
Patient calling with a different# [phone]

## 2013-12-22 NOTE — Telephone Encounter (Signed)
Called and spoke with pt and she stated that she was confused about her meds.  Pt stated that she was told yesterday to start the symbicort.  She was confused and was using the symbicort and the dulera.  i advised the pt that she is not to use the dulera and that this was changed to symbicort.  Pt is aware that she is not to use the spiriva if this is not helping her.  Pt voiced her understanding of this now and nothing further is needed.

## 2013-12-26 ENCOUNTER — Other Ambulatory Visit (INDEPENDENT_AMBULATORY_CARE_PROVIDER_SITE_OTHER): Payer: No Typology Code available for payment source

## 2013-12-26 DIAGNOSIS — E785 Hyperlipidemia, unspecified: Secondary | ICD-10-CM

## 2013-12-26 DIAGNOSIS — I2510 Atherosclerotic heart disease of native coronary artery without angina pectoris: Secondary | ICD-10-CM

## 2013-12-26 LAB — LIPID PANEL
CHOLESTEROL: 178 mg/dL (ref 0–200)
HDL: 49.3 mg/dL (ref >39.00)
LDL CALC: 111 mg/dL — AB (ref 0–99)
Total CHOL/HDL Ratio: 4
Triglycerides: 88 mg/dL (ref 0.0–149.0)
VLDL: 17.6 mg/dL (ref 0.0–40.0)

## 2013-12-26 LAB — HEPATIC FUNCTION PANEL
ALBUMIN: 3.6 g/dL (ref 3.5–5.2)
ALK PHOS: 37 U/L — AB (ref 39–117)
ALT: 13 U/L (ref 0–35)
AST: 14 U/L (ref 0–37)
Bilirubin, Direct: 0 mg/dL (ref 0.0–0.3)
TOTAL PROTEIN: 6.1 g/dL (ref 6.0–8.3)
Total Bilirubin: 0.7 mg/dL (ref 0.2–1.2)

## 2013-12-28 ENCOUNTER — Telehealth: Payer: Self-pay | Admitting: *Deleted

## 2013-12-28 ENCOUNTER — Telehealth: Payer: Self-pay | Admitting: Cardiology

## 2013-12-28 DIAGNOSIS — I2510 Atherosclerotic heart disease of native coronary artery without angina pectoris: Secondary | ICD-10-CM

## 2013-12-28 DIAGNOSIS — E785 Hyperlipidemia, unspecified: Secondary | ICD-10-CM

## 2013-12-28 DIAGNOSIS — I10 Essential (primary) hypertension: Secondary | ICD-10-CM

## 2013-12-28 NOTE — Telephone Encounter (Signed)
New message  ° ° °Patient calling for test results.   °

## 2013-12-28 NOTE — Telephone Encounter (Signed)
Message copied by Nuala Alpha on Thu Dec 28, 2013  6:17 PM ------      Message from: Larey Dresser      Created: Thu Dec 28, 2013  2:59 PM       Can we switch her to Crestor 40 mg daily? Lipids/LFTs in 2 months. ------

## 2013-12-28 NOTE — Telephone Encounter (Signed)
Pt contacted about Dr Aundra Dubin recommendation to switch this pt to Crestor 40 mg daily.   Pt states she would prefer staying on the Atorvastatin instead and not go on the Crestor due to the cost.   Informed pt that also Dr Aundra Dubin recommends pt to have repeat LFT and Lipid profile in 2 months.   Lab appt made for 8/4.   Informed pt that I will forward this message to Dr Aundra Dubin for further review and recommendation.  Pt verbalized understanding and agrees with this treatment plan.

## 2013-12-28 NOTE — Telephone Encounter (Signed)
Reviewed results with pt - she reports that she is taking 80 mg of Atorvastatin already.  Aware I will let Dr Aundra Dubin know and she will be called back with further instructions.

## 2014-01-03 ENCOUNTER — Ambulatory Visit: Payer: No Typology Code available for payment source | Admitting: Pulmonary Disease

## 2014-01-09 ENCOUNTER — Ambulatory Visit: Payer: No Typology Code available for payment source | Admitting: Pulmonary Disease

## 2014-01-10 ENCOUNTER — Telehealth: Payer: Self-pay | Admitting: Cardiology

## 2014-01-10 NOTE — Telephone Encounter (Signed)
New message     Talk to a nurse regarding a cholesterol medication change.  She was going to talk to Dr Aundra Dubin and call her back

## 2014-01-10 NOTE — Telephone Encounter (Signed)
Pt calling to speak with Dr Aundra Dubin and nurse Lelon Frohlich about her cholesterol medication.  Pt states that Dr Aundra Dubin was possibly thinking of switching her to Crestor, but the pt states that is "way too expensive." Pt would like to be on an affordable cholesterol med if possible.  Informed pt that Dr Aundra Dubin and Lelon Frohlich are both out of the office today.  Pt states that's completely fine, just have Ann follow-up with me when she returns to the office on Friday.  Told pt that I will forward this message to both Dr Aundra Dubin and Lelon Frohlich for further review and recommendation.  Pt verbalized understanding and agrees with this plan.

## 2014-01-10 NOTE — Telephone Encounter (Signed)
Continue atorvastatin 80 mg daily (was she taking it daily when labs were done)? Could she add Zetia 10 mg daily to atorvastatin 80 or would that also be too expensive?

## 2014-01-12 ENCOUNTER — Telehealth: Payer: Self-pay | Admitting: *Deleted

## 2014-01-12 MED ORDER — EZETIMIBE 10 MG PO TABS: 10.0000 mg | ORAL_TABLET | Freq: Every day | ORAL | Status: DC

## 2014-01-12 NOTE — Addendum Note (Signed)
Addended by: Katrine Coho on: 01/12/2014 08:55 AM   Modules accepted: Orders

## 2014-01-12 NOTE — Telephone Encounter (Signed)
Pt states she was taking atorvastatin 80mg  daily when labs were done recently. Pt agreed to start Zetia 10mg  in addition to atorvastatin. She will let me know if Zetia is too expensive.

## 2014-01-12 NOTE — Telephone Encounter (Signed)
Pt states she cannot afford Zetia, it will cost more than $200/month.

## 2014-01-14 NOTE — Telephone Encounter (Signed)
Keep atorvastatin 80 daily.

## 2014-01-15 NOTE — Telephone Encounter (Signed)
Pt.notified

## 2014-01-24 ENCOUNTER — Ambulatory Visit (INDEPENDENT_AMBULATORY_CARE_PROVIDER_SITE_OTHER): Payer: No Typology Code available for payment source | Admitting: Pulmonary Disease

## 2014-01-24 ENCOUNTER — Encounter: Payer: Self-pay | Admitting: Pulmonary Disease

## 2014-01-24 VITALS — BP 118/72 | HR 73 | Temp 97.5°F | Ht 66.0 in | Wt 237.4 lb

## 2014-01-24 DIAGNOSIS — J438 Other emphysema: Secondary | ICD-10-CM

## 2014-01-24 MED ORDER — TIOTROPIUM BROMIDE MONOHYDRATE 2.5 MCG/ACT IN AERS: 2.0000 | INHALATION_SPRAY | Freq: Every day | RESPIRATORY_TRACT | Status: DC

## 2014-01-24 NOTE — Assessment & Plan Note (Signed)
The patient is staying on her dulera compliantly, and is willing to try Spiriva again after we have given her better instructions. I have also stressed to her the importance of weight loss, and asked her to consider again referral to pulmonary rehabilitation at [facility]. She will call me if she decides to do this. She is still staying away from cigarettes, and using a vapor device. At the last visit, I have asked that her beta blocker be changed to a worse selective medication, and she has been changed to labetalol. Unfortunately this is not selective enough, and we'll send recommendation for either atenolol/metoprolol/bisoprolol.

## 2014-01-24 NOTE — Progress Notes (Signed)
   Subjective:    Patient ID: [name], female    DOB: 1956-04-05, 58 y.o.   MRN: [id number]  HPI The patient comes in today for followup of her known COPD. She was started on Spiriva at the last visit, but did not completely understand the instructions and therefore felt she couldn't use the device. However, she started using her relatives Combivent for rescue which is the same device. She is willing to try Spiriva again with better instructions. Since the last visit, the patient had an episode of decompensated diastolic heart failure, but responded to changes in her diuretic regimen. She feels that she is breathing fairly well currently, and is back to her usual baseline. I discussed with her again the pulmonary rehabilitation program at [facility].   Review of Systems  Constitutional: Negative for fever and unexpected weight change.  HENT: Negative for congestion, dental problem, ear pain, nosebleeds, postnasal drip, rhinorrhea, sinus pressure, sneezing, sore throat and trouble swallowing.   Eyes: Negative for redness and itching.  Respiratory: Negative for cough, chest tightness, shortness of breath and wheezing.   Cardiovascular: Negative for palpitations and leg swelling.  Gastrointestinal: Negative for nausea and vomiting.  Genitourinary: Negative for dysuria.  Musculoskeletal: Negative for joint swelling.  Skin: Negative for rash.  Neurological: Negative for headaches.  Hematological: Does not bruise/bleed easily.  Psychiatric/Behavioral: Negative for dysphoric mood. The patient is not nervous/anxious.        Objective:   Physical Exam Obese female in no acute distress Nose without purulence or discharge noted Neck without lymphadenopathy or thyromegaly Chest with mildly decreased breath sounds, no wheezing Cardiac exam with regular rate and rhythm Lower extremities with mild edema, no cyanosis Alert and oriented, moves all 4 extremities.         Assessment & Plan:

## 2014-01-24 NOTE — Patient Instructions (Signed)
Stay on dulera, and restart spiriva 2 inhalations each am.  Will send in prescription for you.  Use albuterol for your rescue inhaler, 2 puffs up to every 6 hrs if needed.  Call me if you would like to be referred to pulmonary rehab.  followup with me in 41mos.

## 2014-01-30 ENCOUNTER — Telehealth: Payer: Self-pay | Admitting: Pulmonary Disease

## 2014-01-30 NOTE — Telephone Encounter (Signed)
It is very unlikely that spiriva is causing these symptoms, but would discontinue to see if they resolve.  Would just stay on dulera, and not worry about adding additional medications for now.

## 2014-01-30 NOTE — Telephone Encounter (Signed)
Pt aware of recs.  Nothing further needed. 

## 2014-01-30 NOTE — Telephone Encounter (Signed)
Called and spoke to pt. Pt stated since taking the Spiriva (on 01/24/14) she has experienced a dry cough, increase in fatigue, PND and rhinorrhea. Pt also states she has mild swelling in her hands and feet and has been taking 10mg  of Torsemide qd. Pt taking 25mg  of Benadryl BID prn for the rhinorrhea. Pt wants to know if she can be switched from Spiriva to another medication.  [place] please advise.

## 2014-02-02 DIAGNOSIS — M25519 Pain in unspecified shoulder: Secondary | ICD-10-CM | POA: Insufficient documentation

## 2014-02-12 ENCOUNTER — Telehealth: Payer: Self-pay | Admitting: Cardiology

## 2014-02-12 ENCOUNTER — Encounter: Payer: Self-pay | Admitting: *Deleted

## 2014-02-12 ENCOUNTER — Other Ambulatory Visit: Payer: Self-pay | Admitting: Cardiology

## 2014-02-12 DIAGNOSIS — R079 Chest pain, unspecified: Secondary | ICD-10-CM

## 2014-02-12 MED ORDER — POTASSIUM CHLORIDE CRYS ER 20 MEQ PO TBCR: 20.0000 meq | EXTENDED_RELEASE_TABLET | Freq: Two times a day (BID) | ORAL | Status: DC

## 2014-02-12 NOTE — Telephone Encounter (Signed)
Pt states yesterday while helping son move she had chest pain and SOB, no other associated symptoms. She took 2 NTG with relief. She has felt fine since yesterday and not had any more chest pain/SOB.   She has not been taking KCL for about a month, she just did not get it refilled. She is asking if she should restart or come in for lab first.   I will forward to Dr Aundra Dubin for review.

## 2014-02-12 NOTE — Telephone Encounter (Signed)
Pt advised, verbalized understanding. She thinks she can walk.

## 2014-02-12 NOTE — Telephone Encounter (Signed)
Needs to restart KCl (has been low in past).  Would also get ETT-Cardiolite (or Lexiscan if she does not feel she can walk) given exertional chest pain.  Can get BMET on day of Cardiolite.

## 2014-02-12 NOTE — Telephone Encounter (Signed)
New message     Patient calling  Has concerns about blood work & appt.     Hold slot on  7/31 offer to patients however still have questions for the nurse

## 2014-02-13 ENCOUNTER — Emergency Department (HOSPITAL_COMMUNITY): Payer: No Typology Code available for payment source

## 2014-02-13 ENCOUNTER — Observation Stay (HOSPITAL_COMMUNITY)
Admission: EM | Admit: 2014-02-13 | Discharge: 2014-02-14 | Disposition: A | Discharge status: Home / Self Care | Payer: No Typology Code available for payment source | Attending: Cardiology | Admitting: Cardiology

## 2014-02-13 ENCOUNTER — Encounter (HOSPITAL_COMMUNITY): Payer: Self-pay | Admitting: Emergency Medicine

## 2014-02-13 DIAGNOSIS — Z905 Acquired absence of kidney: Secondary | ICD-10-CM | POA: Insufficient documentation

## 2014-02-13 DIAGNOSIS — K219 Gastro-esophageal reflux disease without esophagitis: Secondary | ICD-10-CM | POA: Diagnosis not present

## 2014-02-13 DIAGNOSIS — I25110 Atherosclerotic heart disease of native coronary artery with unstable angina pectoris: Secondary | ICD-10-CM

## 2014-02-13 DIAGNOSIS — N183 Chronic kidney disease, stage 3 unspecified: Secondary | ICD-10-CM | POA: Diagnosis not present

## 2014-02-13 DIAGNOSIS — Z9861 Coronary angioplasty status: Secondary | ICD-10-CM | POA: Diagnosis not present

## 2014-02-13 DIAGNOSIS — Z87891 Personal history of nicotine dependence: Secondary | ICD-10-CM | POA: Diagnosis not present

## 2014-02-13 DIAGNOSIS — R9431 Abnormal electrocardiogram [ECG] [EKG]: Secondary | ICD-10-CM | POA: Insufficient documentation

## 2014-02-13 DIAGNOSIS — E119 Type 2 diabetes mellitus without complications: Secondary | ICD-10-CM | POA: Diagnosis not present

## 2014-02-13 DIAGNOSIS — I2510 Atherosclerotic heart disease of native coronary artery without angina pectoris: Secondary | ICD-10-CM | POA: Diagnosis not present

## 2014-02-13 DIAGNOSIS — R072 Precordial pain: Secondary | ICD-10-CM

## 2014-02-13 DIAGNOSIS — E669 Obesity, unspecified: Secondary | ICD-10-CM

## 2014-02-13 DIAGNOSIS — Z7982 Long term (current) use of aspirin: Secondary | ICD-10-CM | POA: Insufficient documentation

## 2014-02-13 DIAGNOSIS — Z6838 Body mass index (BMI) 38.0-38.9, adult: Secondary | ICD-10-CM | POA: Diagnosis not present

## 2014-02-13 DIAGNOSIS — F319 Bipolar disorder, unspecified: Secondary | ICD-10-CM

## 2014-02-13 DIAGNOSIS — I129 Hypertensive chronic kidney disease with stage 1 through stage 4 chronic kidney disease, or unspecified chronic kidney disease: Secondary | ICD-10-CM | POA: Insufficient documentation

## 2014-02-13 DIAGNOSIS — J438 Other emphysema: Secondary | ICD-10-CM | POA: Diagnosis not present

## 2014-02-13 DIAGNOSIS — I10 Essential (primary) hypertension: Secondary | ICD-10-CM

## 2014-02-13 DIAGNOSIS — E785 Hyperlipidemia, unspecified: Secondary | ICD-10-CM | POA: Diagnosis not present

## 2014-02-13 DIAGNOSIS — R079 Chest pain, unspecified: Principal | ICD-10-CM

## 2014-02-13 DIAGNOSIS — J439 Emphysema, unspecified: Secondary | ICD-10-CM | POA: Diagnosis present

## 2014-02-13 DIAGNOSIS — F329 Major depressive disorder, single episode, unspecified: Secondary | ICD-10-CM | POA: Diagnosis present

## 2014-02-13 HISTORY — DX: Chronic kidney disease, unspecified: N18.9

## 2014-02-13 LAB — PROTIME-INR
INR: 0.9 (ref 0.00–1.49)
PROTHROMBIN TIME: 12.2 s (ref 11.6–15.2)

## 2014-02-13 LAB — COMPREHENSIVE METABOLIC PANEL
ALBUMIN: 3.1 g/dL — AB (ref 3.5–5.2)
ALT: 12 U/L (ref 0–35)
ANION GAP: 12 (ref 5–15)
AST: 12 U/L (ref 0–37)
Alkaline Phosphatase: 28 U/L — ABNORMAL LOW (ref 39–117)
BILIRUBIN TOTAL: 0.3 mg/dL (ref 0.3–1.2)
BUN: 28 mg/dL — ABNORMAL HIGH (ref 6–23)
CO2: 32 mEq/L (ref 19–32)
Calcium: 8.9 mg/dL (ref 8.4–10.5)
Chloride: 98 mEq/L (ref 96–112)
Creatinine, Ser: 1.8 mg/dL — ABNORMAL HIGH (ref 0.50–1.10)
GFR calc Af Amer: 34 mL/min — ABNORMAL LOW (ref >90)
GFR calc non Af Amer: 30 mL/min — ABNORMAL LOW (ref >90)
Glucose, Bld: 87 mg/dL (ref 70–99)
Potassium: 3.5 mEq/L — ABNORMAL LOW (ref 3.7–5.3)
SODIUM: 142 meq/L (ref 137–147)
TOTAL PROTEIN: 5.6 g/dL — AB (ref 6.0–8.3)

## 2014-02-13 LAB — I-STAT TROPONIN, ED: Troponin i, poc: 0.01 ng/mL (ref 0.00–0.08)

## 2014-02-13 LAB — CBC WITH DIFFERENTIAL/PLATELET
BASOS PCT: 0 % (ref 0–1)
Basophils Absolute: 0 10*3/uL (ref 0.0–0.1)
EOS ABS: 0.2 10*3/uL (ref 0.0–0.7)
Eosinophils Relative: 2 % (ref 0–5)
HCT: 34.5 % — ABNORMAL LOW (ref 36.0–46.0)
Hemoglobin: 11.2 g/dL — ABNORMAL LOW (ref 12.0–15.0)
Lymphocytes Relative: 23 % (ref 12–46)
Lymphs Abs: 2.8 10*3/uL (ref 0.7–4.0)
MCH: 32.5 pg (ref 26.0–34.0)
MCHC: 32.5 g/dL (ref 30.0–36.0)
MCV: 100 fL (ref 78.0–100.0)
Monocytes Absolute: 0.9 10*3/uL (ref 0.1–1.0)
Monocytes Relative: 7 % (ref 3–12)
NEUTROS PCT: 68 % (ref 43–77)
Neutro Abs: 8.3 10*3/uL — ABNORMAL HIGH (ref 1.7–7.7)
Platelets: 233 10*3/uL (ref 150–400)
RBC: 3.45 MIL/uL — AB (ref 3.87–5.11)
RDW: 14.8 % (ref 11.5–15.5)
WBC: 12.2 10*3/uL — ABNORMAL HIGH (ref 4.0–10.5)

## 2014-02-13 LAB — TROPONIN I: Troponin I: <0.3 ng/mL (ref <0.30)

## 2014-02-13 LAB — PRO B NATRIURETIC PEPTIDE: PRO B NATRI PEPTIDE: 133.3 pg/mL — AB (ref 0–125)

## 2014-02-13 MED ORDER — ALPRAZOLAM 0.25 MG PO TABS: 0.2500 mg | ORAL_TABLET | Freq: Two times a day (BID) | ORAL | Status: DC | PRN

## 2014-02-13 MED ORDER — LABETALOL HCL 100 MG PO TABS
100.0000 mg | ORAL_TABLET | Freq: Two times a day (BID) | ORAL | Status: DC
  Administered 2014-02-13 – 2014-02-14 (×2): 100 mg via ORAL
  Filled 2014-02-13 (×3): qty 1

## 2014-02-13 MED ORDER — ALBUTEROL SULFATE (2.5 MG/3ML) 0.083% IN NEBU: 3.0000 mL | INHALATION_SOLUTION | Freq: Four times a day (QID) | RESPIRATORY_TRACT | Status: DC | PRN

## 2014-02-13 MED ORDER — ACETAMINOPHEN 325 MG PO TABS
650.0000 mg | ORAL_TABLET | Freq: Four times a day (QID) | ORAL | Status: DC | PRN
Start: 2014-02-13 — End: 2014-02-13
  Administered 2014-02-13: 650 mg via ORAL
  Filled 2014-02-13: qty 2

## 2014-02-13 MED ORDER — POTASSIUM CHLORIDE CRYS ER 20 MEQ PO TBCR
20.0000 meq | EXTENDED_RELEASE_TABLET | Freq: Two times a day (BID) | ORAL | Status: DC
  Administered 2014-02-13 – 2014-02-14 (×2): 20 meq via ORAL
  Filled 2014-02-13 (×3): qty 1

## 2014-02-13 MED ORDER — ASPIRIN EC 81 MG PO TBEC
81.0000 mg | DELAYED_RELEASE_TABLET | Freq: Every day | ORAL | Status: DC
  Administered 2014-02-14: 81 mg via ORAL
  Filled 2014-02-13: qty 1

## 2014-02-13 MED ORDER — CLONAZEPAM 1 MG PO TABS
2.0000 mg | ORAL_TABLET | Freq: Every day | ORAL | Status: DC
  Administered 2014-02-13: 2 mg via ORAL
  Filled 2014-02-13: qty 2

## 2014-02-13 MED ORDER — ZOLPIDEM TARTRATE 5 MG PO TABS: 5.0000 mg | ORAL_TABLET | Freq: Every evening | ORAL | Status: DC | PRN

## 2014-02-13 MED ORDER — CLONAZEPAM 1 MG PO TABS: 1.0000 mg | ORAL_TABLET | Freq: Three times a day (TID) | ORAL | Status: DC | PRN

## 2014-02-13 MED ORDER — ONDANSETRON HCL 4 MG/2ML IJ SOLN
4.0000 mg | Freq: Once | INTRAMUSCULAR | Status: AC
  Administered 2014-02-13: 4 mg via INTRAVENOUS
  Filled 2014-02-13: qty 2

## 2014-02-13 MED ORDER — ONDANSETRON HCL 4 MG/2ML IJ SOLN: 4.0000 mg | Freq: Four times a day (QID) | INTRAMUSCULAR | Status: DC | PRN

## 2014-02-13 MED ORDER — ACETAMINOPHEN 325 MG PO TABS: 650.0000 mg | ORAL_TABLET | ORAL | Status: DC | PRN

## 2014-02-13 MED ORDER — ZIPRASIDONE HCL 80 MG PO CAPS
160.0000 mg | ORAL_CAPSULE | Freq: Every day | ORAL | Status: DC
  Administered 2014-02-14: 160 mg via ORAL
  Filled 2014-02-13 (×2): qty 2

## 2014-02-13 MED ORDER — MORPHINE SULFATE 4 MG/ML IJ SOLN
4.0000 mg | Freq: Once | INTRAMUSCULAR | Status: AC
  Administered 2014-02-13: 4 mg via INTRAVENOUS
  Filled 2014-02-13: qty 1

## 2014-02-13 MED ORDER — TORSEMIDE 10 MG PO TABS
10.0000 mg | ORAL_TABLET | Freq: Every day | ORAL | Status: DC
  Administered 2014-02-14: 10 mg via ORAL
  Filled 2014-02-13: qty 1

## 2014-02-13 MED ORDER — NITROGLYCERIN 2 % TD OINT
0.5000 [in_us] | TOPICAL_OINTMENT | Freq: Four times a day (QID) | TRANSDERMAL | Status: DC
  Administered 2014-02-13 – 2014-02-14 (×4): 0.5 [in_us] via TOPICAL
  Filled 2014-02-13: qty 30
  Filled 2014-02-13: qty 1

## 2014-02-13 MED ORDER — DULOXETINE HCL 60 MG PO CPEP
60.0000 mg | ORAL_CAPSULE | Freq: Every day | ORAL | Status: DC
  Administered 2014-02-14: 60 mg via ORAL
  Filled 2014-02-13: qty 1

## 2014-02-13 MED ORDER — VITAMIN D 1000 UNITS PO TABS
2000.0000 [IU] | ORAL_TABLET | Freq: Every morning | ORAL | Status: DC
  Administered 2014-02-14: 2000 [IU] via ORAL
  Filled 2014-02-13: qty 2

## 2014-02-13 MED ORDER — ATORVASTATIN CALCIUM 80 MG PO TABS
80.0000 mg | ORAL_TABLET | Freq: Every day | ORAL | Status: DC
  Administered 2014-02-14: 80 mg via ORAL
  Filled 2014-02-13: qty 1

## 2014-02-13 MED ORDER — ALBUTEROL SULFATE HFA 108 (90 BASE) MCG/ACT IN AERS: 2.0000 | INHALATION_SPRAY | Freq: Four times a day (QID) | RESPIRATORY_TRACT | Status: DC | PRN

## 2014-02-13 MED ORDER — MOMETASONE FURO-FORMOTEROL FUM 100-5 MCG/ACT IN AERO
2.0000 | INHALATION_SPRAY | Freq: Two times a day (BID) | RESPIRATORY_TRACT | Status: DC
  Administered 2014-02-14: 2 via RESPIRATORY_TRACT
  Filled 2014-02-13: qty 8.8

## 2014-02-13 MED ORDER — ENOXAPARIN SODIUM 40 MG/0.4ML ~~LOC~~ SOLN
40.0000 mg | Freq: Every day | SUBCUTANEOUS | Status: DC
  Administered 2014-02-13: 40 mg via SUBCUTANEOUS
  Filled 2014-02-13 (×2): qty 0.4

## 2014-02-13 MED ORDER — PANTOPRAZOLE SODIUM 40 MG PO TBEC
80.0000 mg | DELAYED_RELEASE_TABLET | Freq: Every day | ORAL | Status: DC
  Administered 2014-02-13 – 2014-02-14 (×2): 80 mg via ORAL
  Filled 2014-02-13 (×3): qty 2

## 2014-02-13 MED ORDER — HYDROCODONE-ACETAMINOPHEN 5-325 MG PO TABS
1.0000 | ORAL_TABLET | ORAL | Status: DC | PRN
  Administered 2014-02-14: 1 via ORAL
  Filled 2014-02-13: qty 1

## 2014-02-13 NOTE — H&P (Signed)
CARDIOLOGY ADMISSION NOTE  Patient ID: [name] MRN: [id number] DOB/AGE: 11-16-1955 58 y.o.  Admit date: 02/13/2014 Primary Physician   Anselmo Pickler, DO Primary Cardiologist   Dr. Marigene Ehlers Chief Complaint    Chest pain  HPI:  The patient presents for evaluation of chest discomfort. He has a long history of this but does have coronary disease as described below. She reports that she had pain on Sunday and took a nitroglycerin which is unusual for her. She apparently called our office and was referred for a stress perfusion study which is set up for the 29th. However, today she had chest pain again. It radiated through to her back. Sharp. It was previously similar to this when she had her stent. She did take 2 nitroglycerin with resolution of her discomfort. It was 8/10 in intensity. She was somewhat nauseated but didn't vomit. She was diaphoretic. She is very sedentary but otherwise has not been having any acute symptoms prior to Sunday. She sleeps chronically on 3 pillows. She has chronic dyspnea. In the emergency room her EKG demonstrated no acute findings. The first POC marker was negative.  ProBNP was 133.  Creat is elevated at 1.8. Of note her last stress test in July 2014 demonstrated no evidence of ischemia or infarct with a well preserved ejection fraction.  Past Medical History  Diagnosis Date  . Anemia, unspecified   . Major depression   . Narcotic abuse in remission   . Bipolar disorder, unspecified   . Dissociative identity disorder   . HTN (hypertension), benign     a. meds d/c'd by PCP 11/13 due to low BP in setting of significant weight loss and poor appetite related to esophagitis (Toprol restarted late Nov 2013)  . HLD (hyperlipidemia)   . GERD (gastroesophageal reflux disease)   . CAD (coronary artery disease)     a. Cypher DES to mLAD in 2007; b. MV 3/09: EF 72%, no ischemia/infarction.; c. MV (12/11) EF 73%, ? Ant soft tissue atten, no  ischemia/infarction; d. LHC (1/12): patent LAD stent, mild LIs only; e. Lex MV (6/13): no ischemia or infarction.   . Hiatal hernia   . COPD (chronic obstructive pulmonary disease)     a. quit smoking 06/2010; b. PFTs (4/10):  FEV1 66%, ratio 64%, DLCO 67%  . Palpitations     a. Holter (12/11) with occasional PVCs. Holter (11/12): rare PACs and PVCs.  Marland Kitchen Hx of echocardiogram     a. Echo (1/08): EF 65%, no significant valvular abnormalities.; b. Echo (12/11): EF 55-60%, grade II diast dysfn, normal wall motion, mild LAE.; c. Echo (5/13) with EF 55-60%, mild LVH.   Marland Kitchen Esophageal stricture     a. s/p dilatation 7/12;  b. EGD 04/2012 with severe esophagitis and stricture (PPI started and dilatation deferred for several weeks)  . H/O partial nephrectomy 1997  . H/O orthostatic hypotension     probably related to psych meds  . Shortness of breath   . Dysphagia     Past Surgical History  Procedure Laterality Date  . Cesarean section    . Bladder surgery      Tack  . Nephrectomy      Part of left kidney  . Carpal tunnel release      BILATERAL HANDS  . Coronary stent placement  2007  . Abdominal hysterectomy      Allergies  Allergen Reactions  . Trazodone Hcl Anaphylaxis  . Erythromycin Ethylsuccinate Nausea And Vomiting  . Lisinopril  Other (See Comments)    Cough  . Nsaids   . Cefpodoxime Proxetil Other (See Comments)    unknown  . Perphenazine Other (See Comments)    unknown   No current facility-administered medications on file prior to encounter.   Current Outpatient Prescriptions on File Prior to Encounter  Medication Sig Dispense Refill  . albuterol (PROVENTIL HFA;VENTOLIN HFA) 108 (90 BASE) MCG/ACT inhaler Inhale 2 puffs into the lungs every 6 (six) hours as needed for wheezing or shortness of breath.  1 Inhaler  1  . aspirin EC 81 MG tablet Take 81 mg by mouth daily.      Marland Kitchen atorvastatin (LIPITOR) 80 MG tablet Take 1 tablet (80 mg total) by mouth daily.  30 tablet  3  .  Cholecalciferol (VITAMIN D) 2000 UNITS tablet Take 2,000 Units by mouth every morning.      . clonazePAM (KLONOPIN) 1 MG tablet Take 1 mg by mouth 3 (three) times daily as needed for anxiety.       . clonazePAM (KLONOPIN) 2 MG tablet Take 2 mg by mouth at bedtime.      . DULoxetine (CYMBALTA) 60 MG capsule Take 60 mg by mouth daily.       Marland Kitchen labetalol (NORMODYNE) 100 MG tablet Take 100 mg by mouth 2 (two) times daily.       . mometasone-formoterol (DULERA) 100-5 MCG/ACT AERO Inhale 2 puffs into the lungs 2 (two) times daily.      . nitroGLYCERIN (NITROSTAT) 0.4 MG SL tablet Place 0.4 mg under the tongue every 5 (five) minutes as needed for chest pain.      . Omega-3 Fatty Acids (FISH OIL) 300 MG CAPS Take 300 mg by mouth 2 (two) times daily.       Marland Kitchen omeprazole (PRILOSEC) 40 MG capsule Take 80 mg by mouth daily.       . potassium chloride SA (K-DUR,KLOR-CON) 20 MEQ tablet Take 1 tablet (20 mEq total) by mouth 2 (two) times daily.  60 tablet  3  . Propylene Glycol (SYSTANE BALANCE OP) Apply 1 drop to eye 2 (two) times a week.       . torsemide (DEMADEX) 10 MG tablet Take 10 mg by mouth daily.       . ziprasidone (GEODON) 80 MG capsule Take 160 mg by mouth at bedtime.        History   Social History  . Marital Status: Married    Spouse Name: N/A    Number of Children: 4  . Years of Education: N/A   Occupational History  . Homemaker    Social History Main Topics  . Smoking status: Former Smoker -- 1.00 packs/day for 40 years    Types: Cigarettes    Quit date: 07/07/2010  . Smokeless tobacco: Never Used     Comment: Pt states she does smoke an electronic cigarette.  . Alcohol Use: No  . Drug Use: No  . Sexual Activity: Not Currently   Other Topics Concern  . Not on file   Social History Narrative   3-4 caffeine drinks daily    Family History  Problem Relation Age of Onset  . Emphysema Mother   . Ovarian cancer Mother   . Heart disease Father     mi age 41  . Heart attack  Sister 50  . Heart attack Brother 53  . Colon cancer Neg Hx   . Stroke Maternal Grandmother      ROS:  Back pain.  Otherwise as stated in the HPI and negative for all other systems.  Physical Exam: Blood pressure 111/62, pulse 71, temperature 98 F (36.7 C), temperature source Oral, resp. rate 15, SpO2 95.00%.  GENERAL:  Well appearing HEENT:  Pupils equal round and reactive, fundi not visualized, oral mucosa unremarkable, dentures NECK:  No jugular venous distention, waveform within normal limits, carotid upstroke brisk and symmetric, no bruits, no thyromegaly LYMPHATICS:  No cervical, inguinal adenopathy LUNGS:  Clear to auscultation bilaterally BACK:  No CVA tenderness CHEST:  Unremarkable HEART:  PMI not displaced or sustained,S1 and S2 within normal limits, no S3, no S4, no clicks, no rubs, no murmurs ABD:  Flat, positive bowel sounds normal in frequency in pitch, no bruits, no rebound, no guarding, no midline pulsatile mass, no hepatomegaly, no splenomegaly, obese EXT:  2 plus pulses throughout, mild edema, no cyanosis no clubbing SKIN:  No rashes no nodules NEURO:  Cranial nerves II through XII grossly intact, motor grossly intact throughout PSYCH:  Cognitively intact, oriented to person place and time   Labs: Lab Results  Component Value Date   BUN 28* 02/13/2014   Lab Results  Component Value Date   CREATININE 1.80* 02/13/2014   Lab Results  Component Value Date   NA 142 02/13/2014   K 3.5* 02/13/2014   CL 98 02/13/2014   CO2 32 02/13/2014   Lab Results  Component Value Date   TROPONINI <0.30 01/25/2013   Lab Results  Component Value Date   WBC 12.2* 02/13/2014   HGB 11.2* 02/13/2014   HCT 34.5* 02/13/2014   MCV 100.0 02/13/2014   PLT 233 02/13/2014   Lab Results  Component Value Date   CHOL 178 12/26/2013   HDL 49.30 12/26/2013   LDLCALC 111* 12/26/2013   TRIG 88.0 12/26/2013   CHOLHDL 4 12/26/2013   Lab Results  Component Value Date   ALT 12 02/13/2014   AST 12  02/13/2014   ALKPHOS 28* 02/13/2014   BILITOT 0.3 02/13/2014     Radiology:  CXR:  The heart size and mediastinal contours are within normal limits.  Both lungs are clear. No pneumothorax or pleural effusion is noted.  The visualized skeletal structures are unremarkable.  EKG:  NSR, rate 71, axis WNL, QTc prolonged.    ASSESSMENT AND PLAN:    CHEST PAIN:   Her chest pain is atypical.  However, stress perfusion study was planned anyway next week. She will be placed in observation. She will continue her previous medications. Provided there are no enzyme elevation or unstable symptoms overnight she can have a Lexiscan Myoview in the AM.   PROLONGED QT:  This is atypical.  She has no symptoms related to this. She should avoid QT prolonging drugs on board.  CKD:  Creat is slightly above baseline.  We will check a followup basic metabolic profile in the morning.  ANEMIA:  She has had intermittent anemia in the past at this level and no active bleeding.  She can follow with Anselmo Pickler, DO  Signed: Minus Breeding 02/13/2014, 6:03 PM

## 2014-02-13 NOTE — ED Notes (Signed)
Pt still in active chest pain at 5/10; pt can not be transported to 3[facility] til chest pain is controlled; Admitting paged to RN; Report called to Ashley,RN 3W;

## 2014-02-13 NOTE — ED Notes (Signed)
Pt from home via [facility] with c/o sudden onset chest pain while at rest around noon today.  Reports shortness of breath, dizziness, and radiation to mid upper back, denies N/V.  Pt given 3 nitro with relief each time and pain returning between and 324 mg aspirin.  Hx MI.  Pt in NAD, A&O.

## 2014-02-13 NOTE — ED Notes (Signed)
IV attempt by EMS and x 2 RN.  Notified PA Forcucci.  Paged IV team.

## 2014-02-13 NOTE — ED Notes (Signed)
Patient transported to X-ray 

## 2014-02-13 NOTE — ED Notes (Signed)
Spoke with Md; Pt will need to go to step down unit due to active chest pain

## 2014-02-13 NOTE — ED Provider Notes (Signed)
CSN: [id number]     Arrival date & time 02/13/14  1513 History   First MD Initiated Contact with Patient 02/13/14 1534     Chief Complaint  Patient presents with  . Chest Pain   HPI Comments: Patient is a 58 y.o female with a history of MI in 2004, and stents placed in 2007 presents to the [facility] ED with chest pain which started at noon today as she was cleaning the house.  Patient states that her pain was substernal in nature and radiated to her central back.  Patient describes her pain as sharp, intermittent, and worse now in her back than her chest.  Patient states that she tried resting, and took two nitroglycerin which helped and then wore off.  Patient states that chest pain was accompanied by dizziness, shortness of breath, blurry vision.  She denies any numbness, tingling, abdominal pain swelling of her legs, recent surgery, use of estrogen, history of DVT, or immobilization.  Patient does have a history of HTN.  She denies smoking, alcohol use, and drug use.  Patient is an established patient of Dr. Aundra Dubin.    Patient is a 58 y.o. female presenting with chest pain. The history is provided by the patient. No language interpreter was used.  Chest Pain Associated symptoms: dizziness, headache and shortness of breath   Associated symptoms: no abdominal pain, no cough, no fatigue, no fever, no nausea, no numbness, no palpitations, not vomiting and no weakness     Past Medical History  Diagnosis Date  . Anemia, unspecified   . Major depression   . Narcotic abuse in remission   . Bipolar disorder, unspecified   . Dissociative identity disorder   . HTN (hypertension), benign     a. meds d/c'd by PCP 11/13 due to low BP in setting of significant weight loss and poor appetite related to esophagitis (Toprol restarted late Nov 2013)  . HLD (hyperlipidemia)   . GERD (gastroesophageal reflux disease)   . CAD (coronary artery disease)     a. Cypher DES to mLAD in 2007; b. MV 3/09: EF 72%, no  ischemia/infarction.; c. MV (12/11) EF 73%, ? Ant soft tissue atten, no ischemia/infarction; d. LHC (1/12): patent LAD stent, mild LIs only; e. Lex MV (6/13): no ischemia or infarction.   . Hiatal hernia   . COPD (chronic obstructive pulmonary disease)     a. quit smoking 06/2010; b. PFTs (4/10):  FEV1 66%, ratio 64%, DLCO 67%  . Palpitations     a. Holter (12/11) with occasional PVCs. Holter (11/12): rare PACs and PVCs.  Marland Kitchen Hx of echocardiogram     a. Echo (1/08): EF 65%, no significant valvular abnormalities.; b. Echo (12/11): EF 55-60%, grade II diast dysfn, normal wall motion, mild LAE.; c. Echo (5/13) with EF 55-60%, mild LVH.   Marland Kitchen Esophageal stricture     a. s/p dilatation 7/12;  b. EGD 04/2012 with severe esophagitis and stricture (PPI started and dilatation deferred for several weeks)  . H/O orthostatic hypotension     probably related to psych meds  . Dysphagia    Past Surgical History  Procedure Laterality Date  . Cesarean section    . Bladder surgery      Tack  . Nephrectomy      Part of left kidney  . Carpal tunnel release      BILATERAL HANDS  . Coronary stent placement  2007  . Abdominal hysterectomy     Family History  Problem Relation  Age of Onset  . Emphysema Mother   . Ovarian cancer Mother   . Heart disease Father     mi age 91, died with this  . Heart attack Sister 42    died age 73 with MI  . Heart attack Brother 21    died age 37 with MI  . Colon cancer Neg Hx   . Stroke Maternal Grandmother   . Heart attack Mother 13   History  Substance Use Topics  . Smoking status: Former Smoker -- 1.00 packs/day for 40 years    Types: Cigarettes    Quit date: 07/07/2010  . Smokeless tobacco: Never Used     Comment: Pt states she does smoke an electronic cigarette.  . Alcohol Use: No   OB History   Grav Para Term Preterm Abortions TAB SAB Ect Mult Living                 Review of Systems  Constitutional: Negative for fever, chills and fatigue.   Respiratory: Positive for chest tightness and shortness of breath. Negative for cough, wheezing and stridor.   Cardiovascular: Positive for chest pain and leg swelling. Negative for palpitations.  Gastrointestinal: Negative for nausea, vomiting, abdominal pain, diarrhea, constipation and blood in stool.  Genitourinary: Negative for dysuria, urgency, frequency, hematuria and pelvic pain.  Neurological: Positive for dizziness, light-headedness and headaches. Negative for tremors, syncope, speech difficulty, weakness and numbness.  All other systems reviewed and are negative.     Allergies  Trazodone hcl; Erythromycin ethylsuccinate; Lisinopril; Nsaids; Cefpodoxime proxetil; and Perphenazine  Home Medications   Prior to Admission medications   Medication Sig Start Date End Date Taking? Authorizing Provider  albuterol (PROVENTIL HFA;VENTOLIN HFA) 108 (90 BASE) MCG/ACT inhaler Inhale 2 puffs into the lungs every 6 (six) hours as needed for wheezing or shortness of breath. 12/21/13  Yes Kathee Delton, MD  aspirin EC 81 MG tablet Take 81 mg by mouth daily.   Yes Historical Provider, MD  atorvastatin (LIPITOR) 80 MG tablet Take 1 tablet (80 mg total) by mouth daily. 10/30/13  Yes Larey Dresser, MD  Cholecalciferol (VITAMIN D) 2000 UNITS tablet Take 2,000 Units by mouth every morning.   Yes Historical Provider, MD  clonazePAM (KLONOPIN) 1 MG tablet Take 1 mg by mouth 3 (three) times daily as needed for anxiety.    Yes Historical Provider, MD  clonazePAM (KLONOPIN) 2 MG tablet Take 2 mg by mouth at bedtime.   Yes Historical Provider, MD  DULoxetine (CYMBALTA) 60 MG capsule Take 60 mg by mouth daily.    Yes Historical Provider, MD  labetalol (NORMODYNE) 100 MG tablet Take 100 mg by mouth 2 (two) times daily.  08/03/13  Yes Historical Provider, MD  mometasone-formoterol (DULERA) 100-5 MCG/ACT AERO Inhale 2 puffs into the lungs 2 (two) times daily.   Yes Historical Provider, MD  nitroGLYCERIN  (NITROSTAT) 0.4 MG SL tablet Place 0.4 mg under the tongue every 5 (five) minutes as needed for chest pain.   Yes Historical Provider, MD  Omega-3 Fatty Acids (FISH OIL) 300 MG CAPS Take 300 mg by mouth 2 (two) times daily.    Yes Historical Provider, MD  omeprazole (PRILOSEC) 40 MG capsule Take 80 mg by mouth daily.    Yes Historical Provider, MD  potassium chloride SA (K-DUR,KLOR-CON) 20 MEQ tablet Take 1 tablet (20 mEq total) by mouth 2 (two) times daily. 02/12/14  Yes Larey Dresser, MD  Propylene Glycol (SYSTANE BALANCE OP) Apply 1  drop to eye 2 (two) times a week.    Yes Historical Provider, MD  torsemide (DEMADEX) 10 MG tablet Take 10 mg by mouth daily.  06/19/13  Yes Historical Provider, MD  ziprasidone (GEODON) 80 MG capsule Take 160 mg by mouth at bedtime.    Yes Historical Provider, MD   BP 121/67  Pulse 65  Temp(Src) 98 F (36.7 C) (Oral)  Resp 18  SpO2 98% Physical Exam  Nursing note and vitals reviewed. Constitutional: She is oriented to person, place, and time. She appears well-developed and well-nourished. No distress.  HENT:  Head: Normocephalic and atraumatic.  Mouth/Throat: Oropharynx is clear and moist. No oropharyngeal exudate.  Eyes: Conjunctivae and EOM are normal. Pupils are equal, round, and reactive to light. No scleral icterus.  Neck: Normal range of motion. Neck supple. No JVD present. No thyromegaly present.  Cardiovascular: Normal rate, regular rhythm, normal heart sounds and intact distal pulses.  Exam reveals no gallop and no friction rub.   No murmur heard. Pulmonary/Chest: Effort normal and breath sounds normal. No respiratory distress. She has no wheezes. She has no rales. She exhibits tenderness.  Some chest pain reproduces with palpation  Abdominal: Soft. Bowel sounds are normal. She exhibits no distension and no mass. There is no tenderness. There is no rebound and no guarding.  Musculoskeletal: Normal range of motion.  Lymphadenopathy:    She has no  cervical adenopathy.  Neurological: She is alert and oriented to person, place, and time. No cranial nerve deficit. Coordination normal.  Skin: Skin is warm and dry. She is not diaphoretic.  Psychiatric: She has a normal mood and affect. Her behavior is normal. Judgment and thought content normal.    ED Course  Procedures (including critical care time) Labs Review Labs Reviewed  CBC WITH DIFFERENTIAL - Abnormal; Notable for the following:    WBC 12.2 (*)    RBC 3.45 (*)    Hemoglobin 11.2 (*)    HCT 34.5 (*)    Neutro Abs 8.3 (*)    All other components within normal limits  COMPREHENSIVE METABOLIC PANEL - Abnormal; Notable for the following:    Potassium 3.5 (*)    BUN 28 (*)    Creatinine, Ser 1.80 (*)    Total Protein 5.6 (*)    Albumin 3.1 (*)    Alkaline Phosphatase 28 (*)    GFR calc non Af Amer 30 (*)    GFR calc Af Amer 34 (*)    All other components within normal limits  PRO B NATRIURETIC PEPTIDE - Abnormal; Notable for the following:    Pro B Natriuretic peptide (BNP) 133.3 (*)    All other components within normal limits  PROTIME-INR  Randolm Idol, ED    Imaging Review Dg Chest 2 View  02/13/2014   CLINICAL DATA:  Chest pain.  EXAM: CHEST  2 VIEW  COMPARISON:  Dec 12, 2013.  FINDINGS: The heart size and mediastinal contours are within normal limits. Both lungs are clear. No pneumothorax or pleural effusion is noted. The visualized skeletal structures are unremarkable.  IMPRESSION: No active cardiopulmonary disease.   Electronically Signed   By: Sabino Dick M.D.   On: 02/13/2014 16:52     EKG Interpretation   Date/Time:  Tuesday February 13 2014 15:25:25 EDT Ventricular Rate:  79 PR Interval:  137 QRS Duration: 99 QT Interval:  420 QTC Calculation: 481 R Axis:   22 Text Interpretation:  Sinus rhythm No significant change since last  tracing Confirmed by YAO  MD, DAVID ([id number]) on 02/13/2014 3:43:12 PM      MDM   Final diagnoses:  Chest pain,  unspecified chest pain type  Coronary artery disease involving native coronary artery of native heart with unstable angina pectoris   Patient is a 58 y.o female with a history of  HTN, MI, and cardiac stents presents to the ED with chest pain which is not relieved with aspirin, or two PO NTG.  Troponin here is negative.  EKG shows no ST elevation at this time.  CBC shows mild anemia at this time.  CMP and PT/INR are unremarkable at this time.  CXR shows prominent pulmonary vasculature at this time, but no obvious effusions or consolidations.  Patient has hypokalemia at this time and was given 40 mEq of potassium here in the ED.  Patient does have a HEART score of 5.  Patient is an established patient of Dr. Aundra Dubin.  Have spoken with Dr. Jene Every who will come see the patient here in the ED.  Cardiology will admit the patient at this time.           Cherylann Parr, PA-C 02/13/14 1942

## 2014-02-13 NOTE — ED Notes (Signed)
Attempt call to report to floor; RN to call back

## 2014-02-14 ENCOUNTER — Observation Stay (HOSPITAL_COMMUNITY): Payer: No Typology Code available for payment source

## 2014-02-14 DIAGNOSIS — I10 Essential (primary) hypertension: Secondary | ICD-10-CM

## 2014-02-14 DIAGNOSIS — R079 Chest pain, unspecified: Secondary | ICD-10-CM

## 2014-02-14 DIAGNOSIS — F319 Bipolar disorder, unspecified: Secondary | ICD-10-CM

## 2014-02-14 DIAGNOSIS — E669 Obesity, unspecified: Secondary | ICD-10-CM | POA: Diagnosis present

## 2014-02-14 DIAGNOSIS — N183 Chronic kidney disease, stage 3 unspecified: Secondary | ICD-10-CM

## 2014-02-14 LAB — BASIC METABOLIC PANEL
Anion gap: 11 (ref 5–15)
BUN: 27 mg/dL — ABNORMAL HIGH (ref 6–23)
CO2: 33 mEq/L — ABNORMAL HIGH (ref 19–32)
Calcium: 9 mg/dL (ref 8.4–10.5)
Chloride: 100 mEq/L (ref 96–112)
Creatinine, Ser: 1.77 mg/dL — ABNORMAL HIGH (ref 0.50–1.10)
GFR calc Af Amer: 35 mL/min — ABNORMAL LOW (ref >90)
GFR calc non Af Amer: 30 mL/min — ABNORMAL LOW (ref >90)
GLUCOSE: 104 mg/dL — AB (ref 70–99)
POTASSIUM: 4.4 meq/L (ref 3.7–5.3)
SODIUM: 144 meq/L (ref 137–147)

## 2014-02-14 LAB — TROPONIN I
Troponin I: <0.3 ng/mL (ref <0.30)
Troponin I: <0.3 ng/mL (ref <0.30)

## 2014-02-14 LAB — MRSA PCR SCREENING: MRSA BY PCR: NEGATIVE

## 2014-02-14 MED ORDER — ACETAMINOPHEN 325 MG PO TABS: 650.0000 mg | ORAL_TABLET | ORAL | Status: AC | PRN

## 2014-02-14 MED ORDER — POTASSIUM CHLORIDE CRYS ER 20 MEQ PO TBCR: 20.0000 meq | EXTENDED_RELEASE_TABLET | Freq: Every day | ORAL | Status: DC

## 2014-02-14 MED ORDER — TECHNETIUM TC 99M SESTAMIBI GENERIC - CARDIOLITE
10.0000 | Freq: Once | INTRAVENOUS | Status: AC | PRN
  Administered 2014-02-14: 10 via INTRAVENOUS

## 2014-02-14 MED ORDER — TECHNETIUM TC 99M SESTAMIBI GENERIC - CARDIOLITE
30.0000 | Freq: Once | INTRAVENOUS | Status: AC | PRN
  Administered 2014-02-14: 30 via INTRAVENOUS

## 2014-02-14 MED ORDER — REGADENOSON 0.4 MG/5ML IV SOLN
0.4000 mg | Freq: Once | INTRAVENOUS | Status: AC
  Administered 2014-02-14: 0.4 mg via INTRAVENOUS

## 2014-02-14 MED ORDER — REGADENOSON 0.4 MG/5ML IV SOLN
INTRAVENOUS | Status: AC
  Filled 2014-02-14: qty 5

## 2014-02-14 NOTE — Progress Notes (Signed)
Home meds taken from main pharmacy given to pt.

## 2014-02-14 NOTE — Progress Notes (Signed)
Discharged home accompanied by husband, belongings with pt, stable. Discharged instructions given.

## 2014-02-14 NOTE — Discharge Summary (Signed)
Patient ID: [name],  MRN: [id number], DOB/AGE: 1955/09/01 58 y.o.  Admit date: 02/13/2014 Discharge date: 02/14/2014  Primary Care Provider: Anselmo Pickler, DO  Primary Cardiologist: Dr Aundra Dubin  Discharge Diagnoses Principal Problem:   Chest pain with moderate risk of acute coronary syndrome Active Problems:   CAD- LAD DES 2007, cath Jan 2012   COPD (chronic obstructive pulmonary disease) with emphysema   DM2 (diabetes mellitus, type 2)   Stage III CKD   HYPERLIPIDEMIA   DEPRESSION, MAJOR   BIPOLAR DISORDER UNSPECIFIED   HYPERTENSION   G E R D   Obesity- BMI 38   Procedures: Lexiscan Myoview 02/14/14   HPI: [name] is a 58 y.o. female with a history of CAD, HTN, HLD, GERD, COPD, CKD stage III esophageal stricture s/p dilation, bipolar disorder and palpitations who presented to [facility] on 02/13/14 complaining of chest pain.    Hospital Course:    Chest pain- The patient presents for evaluation of chest discomfort. She has a long history of this. She does have coronary disease, s/p LAD DES 2007, last cath Jan 2012, last Myoview June 2013, which showed no ischemia or infarction. She reported that she had pain on 02/11/14 and took a nitroglycerin which is unusual for her. She apparently called our office and was referred for a stress perfusion study which is set up for the 29th. However, today she had chest pain again. It radiated through to her back and was described as sharp. It was similar to this when she had her stent. She did take 2 nitroglycerin with resolution of her discomfort. In the ER 02/13/14 her EKG demonstrated no acute findings. Her Troponin was negative. She ruled out overnight and underwent a Myoview on 02/14/14 which returned normal with EF 71%.  Chronic kidney disease stage III-creatinines have ranged from 1.6-1.8. She is on chronic low-dose torsemide 10 mg. No changes made.   Morbid obesity-encourage weight loss. Dietary restrictions.    Hyperlipidemia-continue with atorvastatin 80 mg. LDL 111.   Hypertension-labetalol, torsemide. No changes made.   Depression-Cymbalta. Geodon.   Prolonged QT-avoid excessive use of QT prolonging drugs. QTC 481 milliseconds.   The patient has had an uncomplicated hospital course and is recovering well. She has been seen by Dr. Marlou Porch today and deemed ready for discharge home. All follow-up appointments have been scheduled. Discharge medications are listed below. Her nuclear stress test for the 29th has been cancelled. She will follow up with Dr. Aundra Dubin as previously scheduled on 03/23/14.   Discharge Vitals:  Blood pressure 95/38, pulse 71, temperature 98.1 F (36.7 C), temperature source Oral, resp. rate 10, height 5' 6.14" (1.68 m), weight 237 lb 7 oz (107.7 kg), SpO2 98.00%.    Labs: Results for orders placed during the hospital encounter of 02/13/14 (from the past 24 hour(s))  CBC WITH DIFFERENTIAL     Status: Abnormal   Collection Time    02/13/14  4:32 PM      Result Value Ref Range   WBC 12.2 (*) 4.0 - 10.5 K/uL   RBC 3.45 (*) 3.87 - 5.11 MIL/uL   Hemoglobin 11.2 (*) 12.0 - 15.0 g/dL   HCT 34.5 (*) 36.0 - 46.0 %   MCV 100.0  78.0 - 100.0 fL   MCH 32.5  26.0 - 34.0 pg   MCHC 32.5  30.0 - 36.0 g/dL   RDW 14.8  11.5 - 15.5 %   Platelets 233  150 - 400 K/uL   Neutrophils  Relative % 68  43 - 77 %   Neutro Abs 8.3 (*) 1.7 - 7.7 K/uL   Lymphocytes Relative 23  12 - 46 %   Lymphs Abs 2.8  0.7 - 4.0 K/uL   Monocytes Relative 7  3 - 12 %   Monocytes Absolute 0.9  0.1 - 1.0 K/uL   Eosinophils Relative 2  0 - 5 %   Eosinophils Absolute 0.2  0.0 - 0.7 K/uL   Basophils Relative 0  0 - 1 %   Basophils Absolute 0.0  0.0 - 0.1 K/uL  COMPREHENSIVE METABOLIC PANEL     Status: Abnormal   Collection Time    02/13/14  4:32 PM      Result Value Ref Range   Sodium 142  137 - 147 mEq/L   Potassium 3.5 (*) 3.7 - 5.3 mEq/L   Chloride 98  96 - 112 mEq/L   CO2 32  19 - 32 mEq/L    Glucose, Bld 87  70 - 99 mg/dL   BUN 28 (*) 6 - 23 mg/dL   Creatinine, Ser 1.80 (*) 0.50 - 1.10 mg/dL   Calcium 8.9  8.4 - 10.5 mg/dL   Total Protein 5.6 (*) 6.0 - 8.3 g/dL   Albumin 3.1 (*) 3.5 - 5.2 g/dL   AST 12  0 - 37 U/L   ALT 12  0 - 35 U/L   Alkaline Phosphatase 28 (*) 39 - 117 U/L   Total Bilirubin 0.3  0.3 - 1.2 mg/dL   GFR calc non Af Amer 30 (*) >90 mL/min   GFR calc Af Amer 34 (*) >90 mL/min   Anion gap 12  5 - 15  PRO B NATRIURETIC PEPTIDE     Status: Abnormal   Collection Time    02/13/14  4:32 PM      Result Value Ref Range   Pro B Natriuretic peptide (BNP) 133.3 (*) 0 - 125 pg/mL  PROTIME-INR     Status: None   Collection Time    02/13/14  4:32 PM      Result Value Ref Range   Prothrombin Time 12.2  11.6 - 15.2 seconds   INR 0.90  0.00 - 1.49  I-STAT TROPOININ, ED     Status: None   Collection Time    02/13/14  4:51 PM      Result Value Ref Range   Troponin i, poc 0.01  0.00 - 0.08 ng/mL   Comment 3           TROPONIN I     Status: None   Collection Time    02/13/14 10:55 PM      Result Value Ref Range   Troponin I <0.30  <0.30 ng/mL  MRSA PCR SCREENING     Status: None   Collection Time    02/13/14 10:58 PM      Result Value Ref Range   MRSA by PCR NEGATIVE  NEGATIVE  TROPONIN I     Status: None   Collection Time    02/14/14  2:35 AM      Result Value Ref Range   Troponin I <0.30  <0.30 ng/mL  BASIC METABOLIC PANEL     Status: Abnormal   Collection Time    02/14/14  2:35 AM      Result Value Ref Range   Sodium 144  137 - 147 mEq/L   Potassium 4.4  3.7 - 5.3 mEq/L   Chloride 100  96 - 112  mEq/L   CO2 33 (*) 19 - 32 mEq/L   Glucose, Bld 104 (*) 70 - 99 mg/dL   BUN 27 (*) 6 - 23 mg/dL   Creatinine, Ser 1.77 (*) 0.50 - 1.10 mg/dL   Calcium 9.0  8.4 - 10.5 mg/dL   GFR calc non Af Amer 30 (*) >90 mL/min   GFR calc Af Amer 35 (*) >90 mL/min   Anion gap 11  5 - 15  TROPONIN I     Status: None   Collection Time    02/14/14 10:00 AM      Result  Value Ref Range   Troponin I <0.30  <0.30 ng/mL    EXAM: 02/14/14 MYOCARDIAL IMAGING WITH SPECT (REST AND PHARMACOLOGIC-STRESS)  GATED LEFT VENTRICULAR WALL MOTION STUDY  LEFT VENTRICULAR EJECTION FRACTION  TECHNIQUE:  Standard myocardial SPECT imaging was performed after resting  intravenous injection of 10 mCi Tc-68m sestamibi. Subsequently,  intravenous infusion of Lexiscan was performed under the supervision  of the Cardiology staff. At peak effect of the drug, 30 mCi Tc-2m  sestamibi was injected intravenously and standard myocardial SPECT  imaging was performed. Quantitative gated imaging was also performed  to evaluate left ventricular wall motion, and estimate left  ventricular ejection fraction.  COMPARISON: None.  FINDINGS:  Baseline and post-Lexiscan ECG is normal. Image quality is good.  Stress and rest images both show a normal pattern of perfusion. The  regional and global LV systolic motion is normal. Calculated EF is  71%.  IMPRESSION:  Normal myocardial perfusion study.     Disposition:      Follow-up Information   Follow up with Loralie Champagne, MD On 03/23/2014. (3:45 pm)    Specialty:  Cardiology   Contact information:   [address] [place] [address] [phone]       Discharge Medications:    Medication List         acetaminophen 325 MG tablet  Commonly known as:  TYLENOL  Take 2 tablets (650 mg total) by mouth every 4 (four) hours as needed for headache or mild pain.     albuterol 108 (90 BASE) MCG/ACT inhaler  Commonly known as:  PROVENTIL HFA;VENTOLIN HFA  Inhale 2 puffs into the lungs every 6 (six) hours as needed for wheezing or shortness of breath.     aspirin EC 81 MG tablet  Take 81 mg by mouth daily.     atorvastatin 80 MG tablet  Commonly known as:  LIPITOR  Take 1 tablet (80 mg total) by mouth daily.     clonazePAM 1 MG tablet  Commonly known as:  KLONOPIN  Take 1 mg by mouth 3 (three) times daily as  needed for anxiety.     clonazePAM 2 MG tablet  Commonly known as:  KLONOPIN  Take 2 mg by mouth at bedtime.     DULoxetine 60 MG capsule  Commonly known as:  CYMBALTA  Take 60 mg by mouth daily.     Fish Oil 300 MG Caps  Take 300 mg by mouth 2 (two) times daily.     labetalol 100 MG tablet  Commonly known as:  NORMODYNE  Take 100 mg by mouth 2 (two) times daily.     mometasone-formoterol 100-5 MCG/ACT Aero  Commonly known as:  DULERA  Inhale 2 puffs into the lungs 2 (two) times daily.     nitroGLYCERIN 0.4 MG SL tablet  Commonly known as:  NITROSTAT  Place 0.4 mg under  the tongue every 5 (five) minutes as needed for chest pain.     omeprazole 40 MG capsule  Commonly known as:  PRILOSEC  Take 80 mg by mouth daily.     potassium chloride SA 20 MEQ tablet  Commonly known as:  K-DUR,KLOR-CON  Take 1 tablet (20 mEq total) by mouth daily.     SYSTANE BALANCE OP  Apply 1 drop to eye 2 (two) times a week.     torsemide 10 MG tablet  Commonly known as:  DEMADEX  Take 10 mg by mouth daily.     Vitamin D 2000 UNITS tablet  Take 2,000 Units by mouth every morning.     ziprasidone 80 MG capsule  Commonly known as:  GEODON  Take 160 mg by mouth at bedtime.         Duration of Discharge Encounter: Greater than 30 minutes including physician time.  Signed, Lorretta Harp  PA-C 02/14/2014 3:35 PM  Personally seen and examined. Agree with above. NUC stress low risk, no ischemia. Reassuring.  CAD - LAD DES as above. Reassuring.  Last cath 2012.  OK for DC.   Candee Furbish, MD

## 2014-02-14 NOTE — Progress Notes (Signed)
Pt still experiencing active chest pain, radiating to R arm and back (6/10). Notified Jules Husbands, MD. New orders given (see MAR). Will continue to monitor.

## 2014-02-14 NOTE — Progress Notes (Addendum)
  Primary cardiologist Dr. Aundra Dubin  Subjective:  Currently with very minimal chest discomfort. No shortness of breath.  Objective:  Vital Signs in the last 24 hours: Temp:  [97.6 F (36.4 C)-98.6 F (37 C)] 98.1 F (36.7 C) (07/22 0748) Pulse Rate:  [64-81] 64 (07/22 0800) Resp:  [9-20] 9 (07/22 0800) BP: (100-139)/(52-88) 124/64 mmHg (07/22 0800) SpO2:  [90 %-98 %] 97 % (07/22 0941) Weight:  [237 lb 7 oz (107.7 kg)] 237 lb 7 oz (107.7 kg) (07/21 2213)  Intake/Output from previous day: 07/21 0701 - 07/22 0700 In: 340 [P.O.:340] Out: 100 [Urine:100]   Physical Exam: General: Well developed, well nourished, in no acute distress. Head:  Normocephalic and atraumatic. Lungs: Clear to auscultation and percussion. Heart: Normal S1 and S2.  No murmur, rubs or gallops.  Abdomen: soft, non-tender, positive bowel sounds. Obese Extremities: No clubbing or cyanosis. No edema. Neurologic: Alert and oriented x 3.    Lab Results:  Recent Labs  02/13/14 1632  WBC 12.2*  HGB 11.2*  PLT 233    Recent Labs  02/13/14 1632 02/14/14 0235  NA 142 144  K 3.5* 4.4  CL 98 100  CO2 32 33*  GLUCOSE 87 104*  BUN 28* 27*  CREATININE 1.80* 1.77*    Recent Labs  02/14/14 0235 02/14/14 1000  TROPONINI <0.30 <0.30   Hepatic Function Panel  Recent Labs  02/13/14 1632  PROT 5.6*  ALBUMIN 3.1*  AST 12  ALT 12  ALKPHOS 28*  BILITOT 0.3   Imaging: Dg Chest 2 View  02/13/2014   CLINICAL DATA:  Chest pain.  EXAM: CHEST  2 VIEW  COMPARISON:  Dec 12, 2013.  FINDINGS: The heart size and mediastinal contours are within normal limits. Both lungs are clear. No pneumothorax or pleural effusion is noted. The visualized skeletal structures are unremarkable.  IMPRESSION: No active cardiopulmonary disease.   Electronically Signed   By: Sabino Dick M.D.   On: 02/13/2014 16:52   Personally viewed.   LDL 111  Telemetry: No adverse arrhythmias Personally viewed.   EKG:   Sinus rhythm, no  other abnormalities  Cardiac Studies:  2013 echocardiogram: 60% EF, mild LVH  Assessment/Plan:   58 year old female with atypical chest discomfort, negative troponin, normal EKG, obesity, chronic kidney disease stage III awaiting stress test.  1. Atypical chest pain-nuclear stress test. If low-risk, she may be discharged. She was supposed to have a stress test planned next week.   She does have underlying coronary artery disease with DES to LAD in 2007, repeat heart catheterization 1/12 showing patent LAD stent, nuclear stress test June 2013 showing no ischemia or infarction.  2. Chronic kidney disease stage III-creatinines have ranged from 1.6-1.8. She is on chronic low-dose torsemide 10 mg. No changes made.  3. Morbid obesity-encourage weight loss. Dietary restrictions.  4. Hyperlipidemia-continue with atorvastatin 80 mg. LDL 111.  5. Hypertension-labetalol, torsemide. No changes made.  6. Depression-Cymbalta. Geodon.  7. Prolonged QT-avoid excessive use of QT prolonging drugs. QTC 481 milliseconds.   Paolo Okane, [place] 02/14/2014, 11:04 AM

## 2014-02-14 NOTE — Progress Notes (Signed)
UR completed 

## 2014-02-14 NOTE — Discharge Instructions (Signed)

## 2014-02-15 ENCOUNTER — Telehealth: Payer: Self-pay | Admitting: Cardiology

## 2014-02-15 NOTE — ED Provider Notes (Signed)
Medical screening examination/treatment/procedure(s) were performed by non-physician practitioner and as supervising physician I was immediately available for consultation/collaboration.   EKG Interpretation   Date/Time:  Tuesday February 13 2014 15:25:25 EDT Ventricular Rate:  79 PR Interval:  137 QRS Duration: 99 QT Interval:  420 QTC Calculation: 481 R Axis:   22 Text Interpretation:  Sinus rhythm No significant change since last  tracing Confirmed by YAO  MD, DAVID ([id number]) on 02/13/2014 3:43:12 PM        Wandra Arthurs, MD 02/15/14 1115

## 2014-02-15 NOTE — Telephone Encounter (Signed)
[name] is calling to get clarification on the prescription for potassium meg , one prescription were sent for one tablet a day and one was sent for 2 tablets a day .Marland Kitchen Please call  Thanks

## 2014-02-16 ENCOUNTER — Other Ambulatory Visit: Payer: Self-pay | Admitting: *Deleted

## 2014-02-16 ENCOUNTER — Encounter: Payer: Self-pay | Admitting: *Deleted

## 2014-02-16 ENCOUNTER — Other Ambulatory Visit: Payer: Self-pay | Admitting: Internal Medicine

## 2014-02-19 ENCOUNTER — Ambulatory Visit (INDEPENDENT_AMBULATORY_CARE_PROVIDER_SITE_OTHER): Payer: No Typology Code available for payment source | Admitting: Cardiology

## 2014-02-19 ENCOUNTER — Encounter: Payer: Self-pay | Admitting: Cardiology

## 2014-02-19 ENCOUNTER — Other Ambulatory Visit: Payer: No Typology Code available for payment source

## 2014-02-19 VITALS — BP 145/86 | HR 68 | Ht 66.0 in | Wt 237.1 lb

## 2014-02-19 DIAGNOSIS — I10 Essential (primary) hypertension: Secondary | ICD-10-CM

## 2014-02-19 DIAGNOSIS — J438 Other emphysema: Secondary | ICD-10-CM

## 2014-02-19 DIAGNOSIS — I509 Heart failure, unspecified: Secondary | ICD-10-CM

## 2014-02-19 DIAGNOSIS — J439 Emphysema, unspecified: Secondary | ICD-10-CM

## 2014-02-19 DIAGNOSIS — E785 Hyperlipidemia, unspecified: Secondary | ICD-10-CM

## 2014-02-19 DIAGNOSIS — I5032 Chronic diastolic (congestive) heart failure: Secondary | ICD-10-CM

## 2014-02-19 DIAGNOSIS — I2510 Atherosclerotic heart disease of native coronary artery without angina pectoris: Secondary | ICD-10-CM

## 2014-02-19 MED ORDER — TORSEMIDE 20 MG PO TABS: ORAL_TABLET | ORAL | Status: DC

## 2014-02-19 MED ORDER — TORSEMIDE 10 MG PO TABS: ORAL_TABLET | ORAL | Status: DC

## 2014-02-19 NOTE — Patient Instructions (Signed)
Take torsemide (demadex) 20mg  alternating with 10mg  daily. This will be 2 of your 10mg  tablets alternating with 1 of your 10mg  tablets daily.   Your physician recommends that you return for lab work in: 2 weeks--BMET/BNP.  Your physician recommends that you schedule a follow-up appointment in: 4 months with Dr Aundra Dubin.

## 2014-02-19 NOTE — Progress Notes (Signed)
Patient ID: ILLA ENLOW, female   DOB: 1956-04-09, 58 y.o.   MRN: [id number] PCP: Dr. Leory Plowman  58 yo with history of CAD and COPD presents for followup.  Last LHC in 2012 showed patent LAD stent.  Echo in 5/13 showed preserved EF and Lexiscan Cardiolite in 7/14 was normal.  BP has been stable.  She has chronic dyspnea after walking 50-100 feet or walking up steps.  She sleeps on 3 pillows.  No PND. She does report fatigue/daytime sleepiness.    Patient was admitted in 7/15 with atypical chest pain.  She ruled out for MI and Lexiscan Cardiolite was done, showing no evidence for ischemia or infarction.  She has had no further chest pain since discharge.   Labs (12/11): BNP 40  Labs (1/12): HCT 38, D dimer negative, LDL 70, HDL 41, creatinine 1.29, TSH  Labs (10/12): TSH normal, LDL 87, HDL 36, K 3.4, creatinine 1.16 Labs (7/13): LDL 251 Labs (9/13): LDL 115, TGs 195, HDL 36, LFTs normal, K 3.3, creatinine 1.2 Labs (11/13); LDL 64, HDL 35 Labs (1/14): K 3.7, creatinine 1.5 Labs (9/14): K 3.1, creatinine 1.6 Labs (6/15): LDL 111, HDL 49 Labs (7/15): K 4.4, creatinine 1.77, BNP 133  Allergies (verified):  1) ! * Vantin  2) Erythromycin Ethylsuccinate (Erythromycin Ethylsuccinate)  3) Cephalexin (Cephalexin)  4) Trazodone Hcl (Trazodone Hcl)   Past Medical History:  1. ANEMIA, NORMOCYTIC  3. DEPRESSION, MAJOR  3. Hx of NARCOTIC ABUSE   4. BIPOLAR DISORDER UNSPECIFIED  5. MULTIPLE PERSONALITY DISORDER  6. HYPERTENSION  7. HYPERLIPIDEMIA  8. G E R D with esophagitis and history of esophageal stricture.  9. C O P D: Quit smoking in 12/11. PFTs (4/10): FEV1 66%, ratio 64%, DLCO 67%.  10. CORONARY HEART DISEASE: Cypher DES to mid LAD in 2007. Myoview 3/09 with EF 72%, no ischemia or infarction. Myoview (12/11) EF 73%, suspected anterior soft tissue attenuation with no ischemia or infarction.  Left heart cath (1/12): patent LAD stent, mild LIs only.  Lexiscan myoview (6/13) showed no  ischemia or infarction.  Lexiscan Cardiolite (7/14) with no ischemia/infarction, EF 75%. Lexiscan Cardiolite (7/15) with EF 71%, no ischemia or infarction.  11. CKD: Partial nephrectomy 1997  12. Diabetes mellitus  13. Echo (1/08): EF 65%, no significant valvular abnormalities. Echo (12/11): EF 55-60%, grade II diastolic dysfunction, normal wall motion, mild left atrial enlargement.  Echo (5/13) with EF 55-60%, mild LVH.  14. Palpitations: Holter (12/11) with occasional PVCs.  Holter (11/12): rare PACs and PVCs.  15. Esophageal stricture s/p dilatation in 7/12.  16. Obesity.  17. Orthostatic hypotension likely related to psych meds  Family History:  emphysema: mother  asthma: mother  heart disease: father with MI at 44, brother and sister with MIs in their 78s.  Sister died with MI at 52.   cancer: mother (ovarian)   Social History:  pt is married with children, lives in [place].  Unemployed  Tobacco Use - Former. 07/07/10  ROS: All systems reviewed and negative except as per HPI.   Current Outpatient Prescriptions  Medication Sig Dispense Refill  . acetaminophen (TYLENOL) 325 MG tablet Take 2 tablets (650 mg total) by mouth every 4 (four) hours as needed for headache or mild pain.      Marland Kitchen albuterol (PROVENTIL HFA;VENTOLIN HFA) 108 (90 BASE) MCG/ACT inhaler Inhale 2 puffs into the lungs every 6 (six) hours as needed for wheezing or shortness of breath.  1 Inhaler  1  . aspirin  EC 81 MG tablet Take 81 mg by mouth daily.      Marland Kitchen atorvastatin (LIPITOR) 80 MG tablet Take 1 tablet (80 mg total) by mouth daily.  30 tablet  3  . Cholecalciferol (VITAMIN D) 2000 UNITS tablet Take 2,000 Units by mouth every morning.      . clonazePAM (KLONOPIN) 1 MG tablet Take 1 mg by mouth 3 (three) times daily as needed for anxiety.       . clonazePAM (KLONOPIN) 2 MG tablet Take 2 mg by mouth at bedtime.      . DULoxetine (CYMBALTA) 60 MG capsule Take 60 mg by mouth daily.       . fenofibrate 160 MG tablet  Take 1 tablet by mouth daily.      Marland Kitchen labetalol (NORMODYNE) 100 MG tablet Take 100 mg by mouth 2 (two) times daily.       . mometasone-formoterol (DULERA) 100-5 MCG/ACT AERO Inhale 2 puffs into the lungs 2 (two) times daily.      . nitroGLYCERIN (NITROSTAT) 0.4 MG SL tablet Place 0.4 mg under the tongue every 5 (five) minutes as needed for chest pain.      . Omega-3 Fatty Acids (FISH OIL) 300 MG CAPS Take 300 mg by mouth 2 (two) times daily.       Marland Kitchen omeprazole (PRILOSEC) 40 MG capsule TAKE 1 CAPSULE BY MOUTH TWICE DAILY  60 capsule  3  . potassium chloride SA (K-DUR,KLOR-CON) 20 MEQ tablet Take 1 tablet (20 mEq total) by mouth daily.  60 tablet  3  . Propylene Glycol (SYSTANE BALANCE OP) Apply 1 drop to eye 2 (two) times a week.       . ziprasidone (GEODON) 80 MG capsule Take 160 mg by mouth at bedtime.       . torsemide (DEMADEX) 10 MG tablet 2 tablets (20mg ) alternating with 1 tablet (10mg ) daily  45 tablet  6   No current facility-administered medications for this visit.    BP 145/86  Pulse 68  Ht 5\' 6"  (1.676 m)  Wt 237 lb 1.9 oz (107.557 kg)  BMI 38.29 kg/m2 General: NAD, obese.  Neck: Thick neck, JVP difficult, no thyromegaly or thyroid nodule.  Lungs: CTAB CV: Nondisplaced PMI.  Heart regular S1/S2, no S3/S4, no murmur.  1+ ankle edema.  No carotid bruit.  Normal pedal pulses.  Abdomen: Soft, nontender, no hepatosplenomegaly, no distention.  Neurologic: Alert and oriented x 3.  Psych: Normal affect. Extremities: No clubbing or cyanosis.   Assessment/Plan: 1. CAD: She had a negative Cardiolite in 7/15 after episode of atypical chest pain.  No chest pain since hospital discharge.  Continue ASA, ACEI, beta blocker, statin.  2. Hyperlipidemia: LDL is still above goal with known CAD (<70).  However, she is on atorvastatin 80 mg daily and cannot afford either switch to Crestor or addition of Zetia.  3. HTN: History of labile BP.  BP seems to be better recently.  4. Obesity: Continue  efforts at weight loss.  Unfortunately, it is up again.  She needs to work aggressively with dietary changes and exercise.  5. OSA: Follows with Dr Gwenette Greet for COPD.  I think that she ought to have a sleep study.  6. COPD: Followed by pulmonary, plan for pulmonary rehab. 7. Chronic diastolic CHF: NYHA class III symptoms.  Dyspnea likely combination of COPD and diastolic CHF.  Would try increasing torsemide to 20 qday alternating with 10 qday.  BMET/BNP in 2 wks.    Followup  in 4 months.   Loralie Champagne 02/19/2014

## 2014-02-20 ENCOUNTER — Telehealth: Payer: Self-pay | Admitting: Pulmonary Disease

## 2014-02-20 NOTE — Telephone Encounter (Signed)
Unable to reach pt i have no way of knowing cost of rehab however the sessions are 2-3 times a week for 12 weeks [name]

## 2014-02-20 NOTE — Telephone Encounter (Signed)
Called and spoke with pt and she is aware of recs.  Pt voiced her understanding and nothing further is needed.

## 2014-02-20 NOTE — Telephone Encounter (Signed)
Per OV 01/24/14; Patient Instructions      Stay on dulera, and restart spiriva 2 inhalations each am.  Will send in prescription for you.   Use albuterol for your rescue inhaler, 2 puffs up to every 6 hrs if needed.   Call me if you would like to be referred to pulmonary rehab.   followup with me in 34mos  ---  Called spoke with pt. She wants to know how much pulm rehab at the [facility] hospital will cost her. She also wants to know how often she will have to go before she decides anything. PCC's please advise if ya'll know how much pulm rehab costs for pt? thanks

## 2014-02-21 ENCOUNTER — Encounter (HOSPITAL_COMMUNITY): Payer: No Typology Code available for payment source

## 2014-02-21 ENCOUNTER — Other Ambulatory Visit: Payer: No Typology Code available for payment source

## 2014-02-26 ENCOUNTER — Telehealth: Payer: Self-pay | Admitting: Pulmonary Disease

## 2014-02-26 NOTE — Telephone Encounter (Signed)
Diagnosis is NOT COPD but emphysema, therefore does not need the pfts.

## 2014-02-26 NOTE — Telephone Encounter (Signed)
Per last OV KC recommended pulmonary rehab and advised the pt to let us know if she decides she wants to go. I received a referral from [place]. This has been placed in [place] green folder. Medicare requires that the pt have a pre and post within one year for the diagnosis of COPD, pt last PFT was 2010. Please advise if ok to order a pre and post for this pt? Also please give the form back to me once you sign so I can notify rehab, thanks. [place] Bing, CMA

## 2014-02-26 NOTE — Telephone Encounter (Signed)
I have faxed over form to [facility] w/ corrected DX code [id number] x1 for [facility].

## 2014-02-27 ENCOUNTER — Other Ambulatory Visit: Payer: No Typology Code available for payment source

## 2014-02-28 ENCOUNTER — Telehealth: Payer: Self-pay | Admitting: Pulmonary Disease

## 2014-02-28 NOTE — Telephone Encounter (Signed)
I do not see anywhere in the patient chart where our office has tried to contact the patient Pt aware that maybe Primary Care has tried to contact her--she is going to give them a call.  Pt aware that I will see if maybe Dr Janifer Adie nurse has tried contacting her. Please advise [place] thanks.

## 2014-03-01 NOTE — Telephone Encounter (Signed)
I did not contact this pt for any reason. Thanks. [place] Bing, CMA

## 2014-03-05 ENCOUNTER — Other Ambulatory Visit (INDEPENDENT_AMBULATORY_CARE_PROVIDER_SITE_OTHER): Payer: No Typology Code available for payment source

## 2014-03-05 DIAGNOSIS — I2510 Atherosclerotic heart disease of native coronary artery without angina pectoris: Secondary | ICD-10-CM

## 2014-03-05 LAB — BASIC METABOLIC PANEL
BUN: 23 mg/dL (ref 6–23)
CALCIUM: 9.4 mg/dL (ref 8.4–10.5)
CHLORIDE: 96 meq/L (ref 96–112)
CO2: 36 mEq/L — ABNORMAL HIGH (ref 19–32)
Creatinine, Ser: 1.9 mg/dL — ABNORMAL HIGH (ref 0.4–1.2)
GFR: 29.1 mL/min — ABNORMAL LOW (ref >60.00)
GLUCOSE: 87 mg/dL (ref 70–99)
POTASSIUM: 3.7 meq/L (ref 3.5–5.1)
SODIUM: 138 meq/L (ref 135–145)

## 2014-03-05 LAB — BRAIN NATRIURETIC PEPTIDE: Pro B Natriuretic peptide (BNP): 44 pg/mL (ref 0.0–100.0)

## 2014-03-07 ENCOUNTER — Telehealth: Payer: Self-pay | Admitting: Internal Medicine

## 2014-03-07 NOTE — Telephone Encounter (Signed)
No answer no voice mail  

## 2014-03-07 NOTE — Telephone Encounter (Signed)
Spoke with the patient. She reports feeling she cannot "get things to go down" and this causes pain. She feels she can make the pain go away sometimes if she swallows water. This happens every time she eats and when she lies down at night. She is taking her omeprazole. No vomiting. Agrees to come in for evaluation by Nevin Bloodgood, NP

## 2014-03-12 ENCOUNTER — Ambulatory Visit: Payer: No Typology Code available for payment source | Admitting: Nurse Practitioner

## 2014-03-16 ENCOUNTER — Other Ambulatory Visit: Payer: Self-pay | Admitting: Cardiology

## 2014-03-23 ENCOUNTER — Ambulatory Visit: Payer: No Typology Code available for payment source | Admitting: Cardiology

## 2014-05-08 ENCOUNTER — Telehealth: Payer: Self-pay | Admitting: Cardiology

## 2014-05-08 NOTE — Telephone Encounter (Signed)
Informed patient of Dr. Claris Gladden surgical clearance.  Reminded patient to have her office send the form for surgical clearance and once it is received we will return it.

## 2014-05-08 NOTE — Telephone Encounter (Signed)
New message    Patient calling having surgery next Wednesday do she need to come in for an visit.

## 2014-05-08 NOTE — Telephone Encounter (Signed)
Recent negative cardiolite.  Can go for surgery unless she has had any symptomatic changes.

## 2014-05-08 NOTE — Telephone Encounter (Signed)
Pt st she is having surgery on her L shoulder next Wednesday (10/21) to have bone spurs removed. Pt st that [facility] are doing the procedure and want to know if Aundra Dubin needs to see the patient before the procedure. Pt also states a clearance form is being sent to the office (has not yet been received).

## 2014-05-09 ENCOUNTER — Telehealth: Payer: Self-pay | Admitting: Pulmonary Disease

## 2014-05-09 NOTE — Telephone Encounter (Signed)
Called spoke with pt. She reports she has ran out of dulera but has called refill into the pharmacy. She reports she is having surgery on shoulder in 1 week. She wants to know if she should get this refilled. i advised pt she is too continue on this per last OV note. nothing further needed

## 2014-05-10 ENCOUNTER — Telehealth: Payer: Self-pay | Admitting: Cardiology

## 2014-05-10 NOTE — Telephone Encounter (Signed)
Received request from Nurse fax box, documents faxed for surgical clearance. To: [place] Fax number: [phone] Attention: 10.15.15/km

## 2014-05-30 ENCOUNTER — Ambulatory Visit: Payer: No Typology Code available for payment source | Admitting: Pulmonary Disease

## 2014-06-05 ENCOUNTER — Ambulatory Visit: Payer: No Typology Code available for payment source | Admitting: Pulmonary Disease

## 2014-06-07 ENCOUNTER — Telehealth: Payer: Self-pay | Admitting: Pulmonary Disease

## 2014-06-07 MED ORDER — LEVOFLOXACIN 750 MG PO TABS: 750.0000 mg | ORAL_TABLET | Freq: Every day | ORAL | Status: DC

## 2014-06-07 NOTE — Telephone Encounter (Signed)
Spoke with pt and advised of Dr Clance's recommendations.  Rx sent to pharmacy. 

## 2014-06-07 NOTE — Telephone Encounter (Signed)
Sounds like she may be developing bronchitis.  Would call in levaquin 750 mg one qd for 5 days.  Let her know she can use her neb if she is having a hard time breathing during episodes when she is sick, but once better she should avoid except for emergencies.

## 2014-06-07 NOTE — Telephone Encounter (Signed)
Pt c/o sorethroat, headache, chest congestion, prod cough (green), increased sob.  Denies fever. Pt using inhalers.  Has nebulizer at home but pt states Dr Gwenette Greet told her not to [place] it.  Not sure if she needs an abx. Pt has appt with [facility] on 06/25/14 for f/u.  Please advise.  Allergies  Allergen Reactions  . Trazodone Anaphylaxis  . Trazodone Hcl Anaphylaxis  . Erythromycin Ethylsuccinate Nausea And Vomiting  . Cefpodoxime Proxetil Other (See Comments)    unknown  . Erythromycin Ethylsuccinate Nausea And Vomiting  . Lisinopril Other (See Comments)    Cough  . Nsaids   . Tolmetin   . Cefpodoxime Proxetil Other (See Comments)    unknown  . Perphenazine Other (See Comments)    unknown unknown    Current Outpatient Prescriptions on File Prior to Visit  Medication Sig Dispense Refill  . acetaminophen (TYLENOL) 325 MG tablet Take 2 tablets (650 mg total) by mouth every 4 (four) hours as needed for headache or mild pain.    Marland Kitchen albuterol (PROVENTIL HFA;VENTOLIN HFA) 108 (90 BASE) MCG/ACT inhaler Inhale 2 puffs into the lungs every 6 (six) hours as needed for wheezing or shortness of breath. 1 Inhaler 1  . aspirin EC 81 MG tablet Take 81 mg by mouth daily.    Marland Kitchen atorvastatin (LIPITOR) 80 MG tablet TAKE 1 TABLET BY MOUTH DAILY 30 tablet 2  . Cholecalciferol (VITAMIN D) 2000 UNITS tablet Take 2,000 Units by mouth every morning.    . clonazePAM (KLONOPIN) 1 MG tablet Take 1 mg by mouth 3 (three) times daily as needed for anxiety.     . clonazePAM (KLONOPIN) 2 MG tablet Take 2 mg by mouth at bedtime.    . DULoxetine (CYMBALTA) 60 MG capsule Take 60 mg by mouth daily.     . fenofibrate 160 MG tablet Take 1 tablet by mouth daily.    Marland Kitchen labetalol (NORMODYNE) 100 MG tablet Take 100 mg by mouth 2 (two) times daily.     . mometasone-formoterol (DULERA) 100-5 MCG/ACT AERO Inhale 2 puffs into the lungs 2 (two) times daily.    . nitroGLYCERIN (NITROSTAT) 0.4 MG SL tablet Place 0.4 mg under the tongue every 5  (five) minutes as needed for chest pain.    . Omega-3 Fatty Acids (FISH OIL) 300 MG CAPS Take 300 mg by mouth 2 (two) times daily.     Marland Kitchen omeprazole (PRILOSEC) 40 MG capsule TAKE 1 CAPSULE BY MOUTH TWICE DAILY 60 capsule 3  . potassium chloride SA (K-DUR,KLOR-CON) 20 MEQ tablet Take 1 tablet (20 mEq total) by mouth daily. 60 tablet 3  . Propylene Glycol (SYSTANE BALANCE OP) Apply 1 drop to eye 2 (two) times a week.     . torsemide (DEMADEX) 10 MG tablet 2 tablets (20mg ) alternating with 1 tablet (10mg ) daily 45 tablet 6  . ziprasidone (GEODON) 80 MG capsule Take 160 mg by mouth at bedtime.      No current facility-administered medications on file prior to visit.

## 2014-06-08 ENCOUNTER — Telehealth: Payer: Self-pay | Admitting: Pulmonary Disease

## 2014-06-08 MED ORDER — AMOXICILLIN-POT CLAVULANATE 875-125 MG PO TABS: 1.0000 | ORAL_TABLET | Freq: Two times a day (BID) | ORAL | Status: DC

## 2014-06-08 NOTE — Telephone Encounter (Signed)
Spoke with the pt  She states that the pharmacist strongly urged her not to take levaquin- has interaction with geodon that can cause "bad heart arrythmias" Pt requesting a different abx  KCm, please advise thanks! Allergies  Allergen Reactions  . Trazodone Anaphylaxis  . Trazodone Hcl Anaphylaxis  . Erythromycin Ethylsuccinate Nausea And Vomiting  . Cefpodoxime Proxetil Other (See Comments)    unknown  . Erythromycin Ethylsuccinate Nausea And Vomiting  . Lisinopril Other (See Comments)    Cough  . Nsaids   . Tolmetin   . Cefpodoxime Proxetil Other (See Comments)    unknown  . Perphenazine Other (See Comments)    unknown unknown

## 2014-06-08 NOTE — Telephone Encounter (Signed)
Pt going out and will not be back until after 4.[name]

## 2014-06-08 NOTE — Telephone Encounter (Signed)
See if she is allergic to PCN.  The allergies are making abx choice difficult. If she can take PCN, ok to call in augmentin 850mg  one bid for 7 days.

## 2014-06-08 NOTE — Telephone Encounter (Signed)
Spoke with the pt and verified that she is not allergic to PCN  I have sent rx for augmentin to her pharm  Nothing further needed

## 2014-06-08 NOTE — Telephone Encounter (Signed)
ATC, NA unable to leave msg, WCB after 4 pm

## 2014-06-25 ENCOUNTER — Encounter: Payer: Self-pay | Admitting: Pulmonary Disease

## 2014-06-25 ENCOUNTER — Ambulatory Visit (INDEPENDENT_AMBULATORY_CARE_PROVIDER_SITE_OTHER): Payer: BC Managed Care – PPO | Admitting: Pulmonary Disease

## 2014-06-25 VITALS — BP 134/76 | HR 75 | Temp 97.7°F | Ht 66.0 in | Wt 243.2 lb

## 2014-06-25 DIAGNOSIS — J438 Other emphysema: Secondary | ICD-10-CM

## 2014-06-25 DIAGNOSIS — R0609 Other forms of dyspnea: Secondary | ICD-10-CM

## 2014-06-25 MED ORDER — TIOTROPIUM BROMIDE MONOHYDRATE 2.5 MCG/ACT IN AERS: 2.0000 | INHALATION_SPRAY | RESPIRATORY_TRACT | Status: DC

## 2014-06-25 MED ORDER — PREDNISONE 10 MG PO TABS: ORAL_TABLET | ORAL | Status: DC

## 2014-06-25 NOTE — Assessment & Plan Note (Signed)
This has always been multifactorial, and related to her morbid obesity, deconditioning, as well as underlying cardiopulmonary disease.

## 2014-06-25 NOTE — Progress Notes (Signed)
   Subjective:    Patient ID: [name], female    DOB: 11-Jun-1956, 58 y.o.   MRN: [id number]  HPI  the patient comes in today for an acute sick visit. She has known moderate COPD, as well as underlying cardiac disease. He also has chronic dyspnea on exertion related to her morbid obesity and deconditioning. She was having issues with a sinopulmonary infection approximately 2 weeks ago, and treated with a course of Augmentin. Her sputum is slowly clearing, and her congestion is better, but she is still having issues with significant sinus congestion and dyspnea on exertion worse than baseline. She was never treated with a course of prednisone. Again, her mucus is going from green to light yellow, and is decreasing in quantity. She has not used any type of antihistamine for her nasal symptoms. She had discontinued Spiriva because of concerns over nonspecific side effects, but now feels these were not related. She would like to try Spiriva again in addition to her dulera. The patient was referred to pulmonary rehabilitation at [facility], but her insurance would not cover. She has since changed insurances, and she is interested in attending rehabilitation.    Review of Systems  Constitutional: Negative for fever and unexpected weight change.  HENT: Positive for congestion, postnasal drip, rhinorrhea, sinus pressure, sneezing, sore throat and trouble swallowing. Negative for dental problem, ear pain and nosebleeds.   Eyes: Negative for redness and itching.  Respiratory: Positive for cough, chest tightness, shortness of breath and wheezing.   Cardiovascular: Negative for palpitations and leg swelling.  Gastrointestinal: Positive for nausea. Negative for vomiting.  Genitourinary: Negative for dysuria.  Musculoskeletal: Negative for joint swelling.  Skin: Negative for rash.  Neurological: Negative for headaches.  Hematological: Does not bruise/bleed easily.  Psychiatric/Behavioral: Negative  for dysphoric mood. The patient is not nervous/anxious.        Objective:   Physical Exam Morbidly obese female in no acute distress Nose without purulent d/c or other abnormality Neck without LN or TMG Chest with mildly decreased bs, decreased depth of inspiration, no wheezing Cardiac exam with RRR LE with mild edema ,no cyanosis Alert and oriented ,moves all 4.             Assessment & Plan:

## 2014-06-25 NOTE — Patient Instructions (Signed)
Continue on your dulera, and will try spiriva 2 inhalations each am again.  Will send in a prescription for this. Will treat with an 8 day course of prednisone to help with your sinus symptoms and breathing. Will refer back to pulmonary rehab at [facility] to see if your new insurance will cover. Get chlorpheniramine 4mg  OTC, and take 1-2 at bedtime and lunch until your nasal congestion/symptoms improve.  Work on weight loss as much as you can.  followup with me again in 64mos.

## 2014-06-25 NOTE — Assessment & Plan Note (Signed)
The patient is staying compliant on her dulera, but discontinued Spiriva over concerns about nonspecific side effects. It is unclear whether these had anything to do with Spiriva. She has had increasing shortness of breath, and would like to try the Spiriva again. Will also give her a short course of prednisone to help with her breathing and also her ongoing nasal congestion. I also stressed to her the importance of aggressive weight loss, and she is willing to see again if insurance will pay for a pulmonary rehabilitation program.

## 2014-06-26 ENCOUNTER — Telehealth: Payer: Self-pay | Admitting: Pulmonary Disease

## 2014-06-26 NOTE — Telephone Encounter (Signed)
If she feels she is having a significant emotional reaction to prednisone, would discontinue.  Hopefully adding spiriva back will help her breathing improve.

## 2014-06-26 NOTE — Telephone Encounter (Signed)
Called spoke with pt. She reports she started the prednisone has been making her very emotional (crying) and sleepy. Pt started crying on the phone. Pt is requesting alternative to prednisone. Please advise KC thanks  Allergies  Allergen Reactions  . Trazodone Anaphylaxis  . Trazodone Hcl Anaphylaxis  . Erythromycin Ethylsuccinate Nausea And Vomiting  . Cefpodoxime Proxetil Other (See Comments)    unknown  . Erythromycin Ethylsuccinate Nausea And Vomiting  . Lisinopril Other (See Comments)    Cough  . Nsaids   . Tolmetin   . Cefpodoxime Proxetil Other (See Comments)    unknown  . Perphenazine Other (See Comments)    unknown unknown

## 2014-06-26 NOTE — Telephone Encounter (Signed)
Called spoke with pt. Aware of below. She voiced understanding and needed nothing further

## 2014-06-27 ENCOUNTER — Ambulatory Visit (INDEPENDENT_AMBULATORY_CARE_PROVIDER_SITE_OTHER): Payer: BC Managed Care – PPO | Admitting: Cardiology

## 2014-06-27 VITALS — BP 132/102 | HR 78 | Ht 66.0 in | Wt 239.0 lb

## 2014-06-27 DIAGNOSIS — I5032 Chronic diastolic (congestive) heart failure: Secondary | ICD-10-CM

## 2014-06-27 DIAGNOSIS — G4733 Obstructive sleep apnea (adult) (pediatric): Secondary | ICD-10-CM

## 2014-06-27 DIAGNOSIS — R0683 Snoring: Secondary | ICD-10-CM

## 2014-06-27 DIAGNOSIS — J438 Other emphysema: Secondary | ICD-10-CM

## 2014-06-27 DIAGNOSIS — I2510 Atherosclerotic heart disease of native coronary artery without angina pectoris: Secondary | ICD-10-CM

## 2014-06-27 DIAGNOSIS — I10 Essential (primary) hypertension: Secondary | ICD-10-CM

## 2014-06-27 MED ORDER — LABETALOL HCL 200 MG PO TABS: 200.0000 mg | ORAL_TABLET | Freq: Two times a day (BID) | ORAL | Status: DC

## 2014-06-27 NOTE — Patient Instructions (Addendum)
Increase labetalol to 200mg  two times a day. You can take 2 of your 100mg  tablets two times a day and use your current supply.  Your physician has recommended that you have a sleep study. This test records several body functions during sleep, including: brain activity, eye movement, oxygen and carbon dioxide blood levels, heart rate and rhythm, breathing rate and rhythm, the flow of air through your mouth and nose, snoring, body muscle movements, and chest and belly movement. BEFORE THE END OF THE YEAR PER DR [facility]  Your physician recommends that you have  lab work today--BMET/BNP/Lipid profile.  Your physician wants you to follow-up in: 6 months with Dr Aundra Dubin. (JUne 2016).  You will receive a reminder letter in the mail two months in advance. If you don't receive a letter, please call our office to schedule the follow-up appointment.

## 2014-06-28 ENCOUNTER — Encounter: Payer: Self-pay | Admitting: Cardiology

## 2014-06-28 LAB — BRAIN NATRIURETIC PEPTIDE: Pro B Natriuretic peptide (BNP): 72 pg/mL (ref 0.0–100.0)

## 2014-06-28 LAB — LIPID PANEL
CHOL/HDL RATIO: 6
Cholesterol: 186 mg/dL (ref 0–200)
HDL: 31.8 mg/dL — AB (ref >39.00)
LDL Cholesterol: 120 mg/dL — ABNORMAL HIGH (ref 0–99)
NonHDL: 154.2
TRIGLYCERIDES: 170 mg/dL — AB (ref 0.0–149.0)
VLDL: 34 mg/dL (ref 0.0–40.0)

## 2014-06-28 LAB — BASIC METABOLIC PANEL
BUN: 18 mg/dL (ref 6–23)
CHLORIDE: 105 meq/L (ref 96–112)
CO2: 28 meq/L (ref 19–32)
Calcium: 8.7 mg/dL (ref 8.4–10.5)
Creatinine, Ser: 1.6 mg/dL — ABNORMAL HIGH (ref 0.4–1.2)
GFR: 35.52 mL/min — AB (ref >60.00)
GLUCOSE: 98 mg/dL (ref 70–99)
POTASSIUM: 3.6 meq/L (ref 3.5–5.1)
SODIUM: 142 meq/L (ref 135–145)

## 2014-06-28 NOTE — Progress Notes (Signed)
Patient ID: ROSEA DORY, female   DOB: [birth year], 58 y.o.   MRN: [id number] PCP: Dr. Anselmo Pickler  58 yo with history of CAD and COPD presents for followup.  Last LHC in 2012 showed patent LAD stent.  Echo in 5/13 showed preserved EF and Lexiscan Cardiolite in 7/14 was normal.  BP has been stable.  She has chronic dyspnea after walking 50-100 feet or walking up steps.  She sleeps on 3 pillows.  No PND. She does report fatigue/daytime sleepiness and snoring. Weight is up 2 lbs. BP is high today and runs high on occasion at home.   Patient was admitted in 7/15 with atypical chest pain.  She ruled out for MI and Lexiscan Cardiolite was done, showing no evidence for ischemia or infarction.  She has had no further chest pain since discharge.   Patient has a history of PVCs/PACs and has occasional episodes of palpitations. No lightheadedness or syncope.   Labs (12/11): BNP 40  Labs (1/12): HCT 38, D dimer negative, LDL 70, HDL 41, creatinine 1.29, TSH  Labs (10/12): TSH normal, LDL 87, HDL 36, K 3.4, creatinine 1.16 Labs (7/13): LDL 251 Labs (9/13): LDL 115, TGs 195, HDL 36, LFTs normal, K 3.3, creatinine 1.2 Labs (11/13); LDL 64, HDL 35 Labs (1/14): K 3.7, creatinine 1.5 Labs (9/14): K 3.1, creatinine 1.6 Labs (6/15): LDL 111, HDL 49 Labs (7/15): K 4.4, creatinine 1.77, BNP 133 Labs (8/15): K 3.7, creatinine 1.9  Allergies (verified):  1) ! * Vantin  2) Erythromycin Ethylsuccinate (Erythromycin Ethylsuccinate)  3) Cephalexin (Cephalexin)  4) Trazodone Hcl (Trazodone Hcl)   Past Medical History:  1. ANEMIA, NORMOCYTIC  3. DEPRESSION, MAJOR  3. Hx of NARCOTIC ABUSE   4. BIPOLAR DISORDER UNSPECIFIED  5. MULTIPLE PERSONALITY DISORDER  6. HYPERTENSION  7. HYPERLIPIDEMIA  8. G E R D with esophagitis and history of esophageal stricture.  9. C O P D: Quit smoking in 12/11. PFTs (4/10): FEV1 66%, ratio 64%, DLCO 67%.  10. CORONARY HEART DISEASE: Cypher DES to mid LAD in 2007.  Myoview 3/09 with EF 72%, no ischemia or infarction. Myoview (12/11) EF 73%, suspected anterior soft tissue attenuation with no ischemia or infarction.  Left heart cath (1/12): patent LAD stent, mild LIs only.  Lexiscan myoview (6/13) showed no ischemia or infarction.  Lexiscan Cardiolite (7/14) with no ischemia/infarction, EF 75%. Lexiscan Cardiolite (7/15) with EF 71%, no ischemia or infarction.  11. CKD: Partial nephrectomy 1997  12. Diabetes mellitus  13. Echo (1/08): EF 65%, no significant valvular abnormalities. Echo (12/11): EF 55-60%, grade II diastolic dysfunction, normal wall motion, mild left atrial enlargement.  Echo (5/13) with EF 55-60%, mild LVH.  14. Palpitations: Holter (12/11) with occasional PVCs.  Holter (11/12): rare PACs and PVCs.  15. Esophageal stricture s/p dilatation in 7/12.  16. Obesity.  17. Orthostatic hypotension likely related to psych meds  Family History:  emphysema: mother  asthma: mother  heart disease: father with MI at 40, brother and sister with MIs in their 89s.  Sister died with MI at 29.   cancer: mother (ovarian)   Social History:  pt is married with children, lives in [place].  Unemployed  Tobacco Use - Former. 07/07/10  ROS: All systems reviewed and negative except as per HPI.   Current Outpatient Prescriptions  Medication Sig Dispense Refill  . acetaminophen (TYLENOL) 325 MG tablet Take 2 tablets (650 mg total) by mouth every 4 (four) hours as needed for headache or mild  pain.    . albuterol (PROVENTIL HFA;VENTOLIN HFA) 108 (90 BASE) MCG/ACT inhaler Inhale 2 puffs into the lungs every 6 (six) hours as needed for wheezing or shortness of breath. 1 Inhaler 1  . aspirin EC 81 MG tablet Take 81 mg by mouth daily.    Marland Kitchen atorvastatin (LIPITOR) 80 MG tablet TAKE 1 TABLET BY MOUTH DAILY 30 tablet 2  . Cholecalciferol (VITAMIN D) 2000 UNITS tablet Take 2,000 Units by mouth every morning.    . clonazePAM (KLONOPIN) 1 MG tablet Take 1 mg by mouth 3  (three) times daily as needed for anxiety.     . clonazePAM (KLONOPIN) 2 MG tablet Take 2 mg by mouth at bedtime.    . DULoxetine (CYMBALTA) 60 MG capsule Take 60 mg by mouth daily.     . fenofibrate 160 MG tablet Take 1 tablet by mouth daily.    . mometasone-formoterol (DULERA) 100-5 MCG/ACT AERO Inhale 2 puffs into the lungs 2 (two) times daily.    . nitroGLYCERIN (NITROSTAT) 0.4 MG SL tablet Place 0.4 mg under the tongue every 5 (five) minutes as needed for chest pain.    . Omega-3 Fatty Acids (FISH OIL) 300 MG CAPS Take 300 mg by mouth 2 (two) times daily.     Marland Kitchen omeprazole (PRILOSEC) 40 MG capsule TAKE 1 CAPSULE BY MOUTH TWICE DAILY 60 capsule 3  . potassium chloride SA (K-DUR,KLOR-CON) 20 MEQ tablet Take 1 tablet (20 mEq total) by mouth daily. 60 tablet 3  . Propylene Glycol (SYSTANE BALANCE OP) Apply 1 drop to eye 2 (two) times a week.     . Tiotropium Bromide Monohydrate (SPIRIVA RESPIMAT) 2.5 MCG/ACT AERS Inhale 2 puffs into the lungs every morning. 4 g 3  . torsemide (DEMADEX) 10 MG tablet 2 tablets (20mg ) alternating with 1 tablet (10mg ) daily (Patient taking differently: Take 10 mg by mouth once. ) 45 tablet 6  . ziprasidone (GEODON) 80 MG capsule Take 120 mg by mouth at bedtime.     Marland Kitchen labetalol (NORMODYNE) 200 MG tablet Take 1 tablet (200 mg total) by mouth 2 (two) times daily. 60 tablet 6   No current facility-administered medications for this visit.    BP 132/102 mmHg  Pulse 78  Ht 5\' 6"  (1.676 m)  Wt 239 lb (108.41 kg)  BMI 38.59 kg/m2 General: NAD, obese.  Neck: Thick neck, JVP not elevated, no thyromegaly or thyroid nodule.  Lungs: CTAB CV: Nondisplaced PMI.  Heart regular S1/S2, no S3/S4, no murmur.  Trace ankle edema.  No carotid bruit.  Normal pedal pulses.  Abdomen: Soft, nontender, no hepatosplenomegaly, no distention.  Neurologic: Alert and oriented x 3.  Psych: Normal affect. Extremities: No clubbing or cyanosis.   Assessment/Plan: 1. CAD: She had a negative  Cardiolite in 7/15 after episode of atypical chest pain.  No chest pain since hospital discharge.  Continue ASA, ACEI, beta blocker, statin.  2. Hyperlipidemia: LDL has been above goal with known CAD (<70).  However, she is on atorvastatin 80 mg daily and cannot afford either switch to Crestor or addition of Zetia. I will check lipids today.  3. HTN: BP running high at times.  Increase labetalol to 200 mg bid.  4. Obesity: Continue efforts at weight loss.  Unfortunately, it is up again.  She needs to work aggressively with dietary changes and exercise.  5. OSA: I suspect that she has sleep apnea, will schedule sleep study.  6. COPD: Followed by pulmonary, plan for pulmonary rehab. 7.  Chronic diastolic CHF: NYHA class III symptoms.  Dyspnea likely combination of COPD and diastolic CHF.  She is not volume overloaded today, continue current torsemide.  BMET/BNP today.  8. Palpitations: H/o PACs and PVCs by monitoring.  Increasing labetalol today may help.  Followup in 6 months.   Loralie Champagne 06/28/2014

## 2014-07-12 ENCOUNTER — Encounter (HOSPITAL_BASED_OUTPATIENT_CLINIC_OR_DEPARTMENT_OTHER): Payer: BC Managed Care – PPO

## 2014-07-17 ENCOUNTER — Other Ambulatory Visit: Payer: Self-pay | Admitting: Internal Medicine

## 2014-07-18 ENCOUNTER — Ambulatory Visit (HOSPITAL_BASED_OUTPATIENT_CLINIC_OR_DEPARTMENT_OTHER): Payer: BC Managed Care – PPO | Attending: Cardiology | Admitting: Radiology

## 2014-07-18 VITALS — Ht 66.0 in | Wt 240.0 lb

## 2014-07-18 DIAGNOSIS — G4719 Other hypersomnia: Secondary | ICD-10-CM

## 2014-07-18 DIAGNOSIS — G4733 Obstructive sleep apnea (adult) (pediatric): Secondary | ICD-10-CM | POA: Diagnosis not present

## 2014-07-23 ENCOUNTER — Ambulatory Visit (HOSPITAL_COMMUNITY): Payer: BC Managed Care – PPO | Attending: Orthopedic Surgery | Admitting: Cardiology

## 2014-07-23 ENCOUNTER — Other Ambulatory Visit (HOSPITAL_COMMUNITY): Payer: Self-pay | Admitting: Cardiology

## 2014-07-23 ENCOUNTER — Encounter (HOSPITAL_COMMUNITY): Payer: BC Managed Care – PPO

## 2014-07-23 ENCOUNTER — Other Ambulatory Visit: Payer: Self-pay | Admitting: Internal Medicine

## 2014-07-23 DIAGNOSIS — E785 Hyperlipidemia, unspecified: Secondary | ICD-10-CM | POA: Insufficient documentation

## 2014-07-23 DIAGNOSIS — R0600 Dyspnea, unspecified: Secondary | ICD-10-CM | POA: Diagnosis not present

## 2014-07-23 DIAGNOSIS — M7989 Other specified soft tissue disorders: Secondary | ICD-10-CM

## 2014-07-23 DIAGNOSIS — I2510 Atherosclerotic heart disease of native coronary artery without angina pectoris: Secondary | ICD-10-CM | POA: Insufficient documentation

## 2014-07-23 DIAGNOSIS — E119 Type 2 diabetes mellitus without complications: Secondary | ICD-10-CM | POA: Insufficient documentation

## 2014-07-23 DIAGNOSIS — I10 Essential (primary) hypertension: Secondary | ICD-10-CM | POA: Insufficient documentation

## 2014-07-23 DIAGNOSIS — Z720 Tobacco use: Secondary | ICD-10-CM | POA: Insufficient documentation

## 2014-07-23 DIAGNOSIS — M79602 Pain in left arm: Secondary | ICD-10-CM | POA: Insufficient documentation

## 2014-07-23 DIAGNOSIS — J449 Chronic obstructive pulmonary disease, unspecified: Secondary | ICD-10-CM | POA: Diagnosis not present

## 2014-07-23 MED ORDER — OMEPRAZOLE 40 MG PO CPDR: 40.0000 mg | DELAYED_RELEASE_CAPSULE | Freq: Two times a day (BID) | ORAL | Status: DC

## 2014-07-23 NOTE — Progress Notes (Signed)
Left upper venous duplex performed

## 2014-07-23 NOTE — Telephone Encounter (Signed)
rx sent until 08/22/14 appointment.

## 2014-08-07 ENCOUNTER — Telehealth: Payer: Self-pay | Admitting: Cardiology

## 2014-08-07 DIAGNOSIS — G4734 Idiopathic sleep related nonobstructive alveolar hypoventilation: Secondary | ICD-10-CM

## 2014-08-07 DIAGNOSIS — G4719 Other hypersomnia: Secondary | ICD-10-CM | POA: Insufficient documentation

## 2014-08-07 NOTE — Sleep Study (Signed)
   NAME: [name] DATE OF BIRTH:  November 03, 1955 MEDICAL RECORD NUMBER [id number]  LOCATION: [facility]  PHYSICIAN: Desmund Elman R  DATE OF STUDY: 07/18/2014  SLEEP STUDY TYPE: Nocturnal Polysomnogram               REFERRING PHYSICIAN: Larey Dresser, MD  INDICATION FOR STUDY: snoring, excessive daytime fatigue and sleepiness  EPWORTH SLEEPINESS SCORE: 2 HEIGHT: 5\' 6"  (167.6 cm)  WEIGHT: 240 lb (108.863 kg)    Body mass index is 38.76 kg/(m^2).  NECK SIZE: 18.5 in.  MEDICATIONS: Reviewed in the chart  SLEEP ARCHITECTURE: The patient slept for a total of 259 minutes with no slow wave sleep and 39 minutes of REM sleep.  The onset to sleep latency was 128 minutes and onset to REM latency was delayed at 146 minutes.  The sleep efficiency was reduced at 65%.    RESPIRATORY DATA: The patient had no apneas and 2 hypopneas during the study which occurred in the supine position during REM sleep.  The total AHI was normal at 0.5 events per hour.  There was mild snoring.  OXYGEN DATA: The nadir O2 sat was 91% during REM sleep and 92% during NREM sleep.  There total time spent with O2 sats less than 88% was 17 minutes.  CARDIAC DATA: The patient maintained NSR throughout the study  MOVEMENT/PARASOMNIA: There were nor periodic limb movements or REM behavior disorders during the study.  IMPRESSION/ RECOMMENDATION:   1.  No evidence of significant obstructive sleep apnea/hypopnea syndrome with AHI 0.5 events per hour.   2.  Reduced sleep efficiency with increased frequency of arousals due to spontaneous arousals.   3.  Oxygen desaturations with 17 minutes of total sleep time spent with O2 saturations <88%.  Recommend overnight pulse oximetry to see if patient has >5 minutes of continuous O2 desaturations <88% which would be consistent with obesity hypoventilation syndrome without sleep apnea and qualify for nighttime O2 while sleeping.    4.  There were no limb  movement disorders noted. 5.  The patient should be counseled on good sleep hygiene and weight loss. 6.  The patient 's medications should be reviewed and adjusted as necessary.  She is on an SSRI which can suppress REM sleep.  Signed:  Sueanne Margarita Diplomate, American Board of Sleep Medicine  ELECTRONICALLY SIGNED ON:  08/07/2014, 11:03 PM [place] PH: [phone]   FX: [phone] [place]

## 2014-08-07 NOTE — Telephone Encounter (Signed)
Please let patient know that she has no evidence of sleep apnea but her oxygen does drop in her sleep.  Please order an overnight pulse oximetry to assess the amount of consecutive time spent with O2 sats < 88%

## 2014-08-08 NOTE — Addendum Note (Signed)
Addended by: Harland German A on: 08/08/2014 01:38 PM   Modules accepted: Orders

## 2014-08-08 NOTE — Telephone Encounter (Signed)
Patient informed of results and verbal understanding expressed.  Overnight pulse-ox ordered for scheduling.

## 2014-08-09 ENCOUNTER — Telehealth: Payer: Self-pay | Admitting: *Deleted

## 2014-08-09 NOTE — Telephone Encounter (Signed)
Larey Dresser, MD   Sent: Wed August 08, 2014 11:38 AM   To: Katrine Coho, RN      Message    Please let her know that there was no significant sleep apnea but will need overnight oximetry for nocturnal desaturation.    See phone note 08/07/14 -this has been done.

## 2014-08-19 ENCOUNTER — Other Ambulatory Visit: Payer: Self-pay | Admitting: Cardiology

## 2014-08-20 ENCOUNTER — Telehealth: Payer: Self-pay | Admitting: Cardiology

## 2014-08-20 NOTE — Telephone Encounter (Signed)
New problem   [facility] was suppose to call the pt for pulse oxygen study and they didn't call. Pt would like to speak to you concerning this matter.

## 2014-08-20 NOTE — Telephone Encounter (Signed)
Apologized to patient she [facility] has not been in contact with her yet and told her I will send a reminder to them to schedule her overnight pulse oximetry.

## 2014-08-22 ENCOUNTER — Encounter: Payer: Self-pay | Admitting: Cardiology

## 2014-08-22 ENCOUNTER — Ambulatory Visit (INDEPENDENT_AMBULATORY_CARE_PROVIDER_SITE_OTHER): Payer: BLUE CROSS/BLUE SHIELD | Admitting: Internal Medicine

## 2014-08-22 ENCOUNTER — Encounter: Payer: Self-pay | Admitting: Internal Medicine

## 2014-08-22 VITALS — BP 144/86 | HR 88 | Ht 66.0 in | Wt 242.0 lb

## 2014-08-22 DIAGNOSIS — K219 Gastro-esophageal reflux disease without esophagitis: Secondary | ICD-10-CM

## 2014-08-22 DIAGNOSIS — K222 Esophageal obstruction: Secondary | ICD-10-CM

## 2014-08-22 DIAGNOSIS — R197 Diarrhea, unspecified: Secondary | ICD-10-CM

## 2014-08-22 DIAGNOSIS — K589 Irritable bowel syndrome without diarrhea: Secondary | ICD-10-CM

## 2014-08-22 MED ORDER — RANITIDINE HCL 300 MG PO TABS: 300.0000 mg | ORAL_TABLET | Freq: Every day | ORAL | Status: AC

## 2014-08-22 MED ORDER — OMEPRAZOLE 40 MG PO CPDR: 40.0000 mg | DELAYED_RELEASE_CAPSULE | Freq: Two times a day (BID) | ORAL | Status: AC

## 2014-08-22 NOTE — Progress Notes (Signed)
HISTORY OF PRESENT ILLNESS:  [name] is a 59 y.o. female with multiple significant medical problems and psychiatric problems as outlined below. She was last evaluated in the office April 1610 for GERD complicated by erosive esophagitis and peptic stricture. Also ongoing problems with intermittent diarrhea with negative workup and empiric therapies. Upper endoscopy February 2014 with esophageal dilation. She has continued on omeprazole 40 mg twice daily since. Colonoscopy July 2012 with poor prep as described. Follow-up in 5 years with vigorous 2 day prep recommended. Patient presents today for follow-up. Despite omeprazole 40 mg twice daily, she has significant breakthrough reflux 04-5 times per week for which she takes antacids. Fortunately, no recurrent dysphagia. She continues with diarrhea often exacerbated by meals and associated with urgency. She does take Imodium on demand with relief for a day or 2. Occasional incontinence. GI review of systems is otherwise negative. She has had ongoing progressive weight gain.  REVIEW OF SYSTEMS:  All non-GI ROS negative except for arthritis, insomnia  Past Medical History  Diagnosis Date  . Anemia, unspecified   . Major depression   . Narcotic abuse in remission   . Bipolar disorder, unspecified   . Dissociative identity disorder   . HTN (hypertension), benign     a. meds d/c'd by PCP 11/13 due to low BP in setting of significant weight loss and poor appetite related to esophagitis (Toprol restarted late Nov 2013)  . HLD (hyperlipidemia)   . GERD (gastroesophageal reflux disease)   . CAD (coronary artery disease)     a. Cypher DES to mLAD in 2007; b. MV 3/09: EF 72%, no ischemia/infarction.; c. MV (12/11) EF 73%, ? Ant soft tissue atten, no ischemia/infarction; d. LHC (1/12): patent LAD stent, mild LIs only; e. Lex MV (6/13): no ischemia or infarction.   . Hiatal hernia   . COPD (chronic obstructive pulmonary disease)     a. quit  smoking 06/2010; b. PFTs (4/10):  FEV1 66%, ratio 64%, DLCO 67%  . Palpitations     a. Holter (12/11) with occasional PVCs. Holter (11/12): rare PACs and PVCs.  Marland Kitchen Hx of echocardiogram     a. Echo (1/08): EF 65%, no significant valvular abnormalities.; b. Echo (12/11): EF 55-60%, grade II diast dysfn, normal wall motion, mild LAE.; c. Echo (5/13) with EF 55-60%, mild LVH.   Marland Kitchen Esophageal stricture     a. s/p dilatation 7/12;  b. EGD 04/2012 with severe esophagitis and stricture (PPI started and dilatation deferred for several weeks)  . H/O orthostatic hypotension     probably related to psych meds  . Dysphagia   . Chronic kidney disease   . Myocardial infarction 2007    stent placed    Past Surgical History  Procedure Laterality Date  . Cesarean section    . Bladder surgery      Tack  . Nephrectomy      Part of left kidney  . Carpal tunnel release      BILATERAL HANDS  . Coronary stent placement  2007  . Abdominal hysterectomy      Social History [name]  reports that she quit smoking about 4 years ago. Her smoking use included Cigarettes. She has a 40 pack-year smoking history. She has never used smokeless tobacco. She reports that she does not drink alcohol or use illicit drugs.  family history includes Emphysema in her mother; Heart attack (age of onset: 68) in her brother and sister; Heart attack (age of onset: 55)  in her mother; Heart disease in her father; Ovarian cancer in her mother; Stroke in her maternal grandmother. There is no history of Colon cancer.  Allergies  Allergen Reactions  . Trazodone Anaphylaxis  . Trazodone Hcl Anaphylaxis  . Erythromycin Ethylsuccinate Nausea And Vomiting  . Cefpodoxime Proxetil Other (See Comments)    unknown  . Erythromycin Ethylsuccinate Nausea And Vomiting  . Lisinopril Other (See Comments)    Cough  . Nsaids   . Tolmetin   . Cefpodoxime Proxetil Other (See Comments)    unknown  . Perphenazine Other (See  Comments)    unknown unknown  . Prednisone Anxiety    Gets emotional.       PHYSICAL EXAMINATION: Vital signs: BP 144/86 mmHg  Pulse 88  Ht 5\' 6"  (1.676 m)  Wt 242 lb (109.77 kg)  BMI 39.08 kg/m2 General: Obese, Well-developed, well-nourished, no acute distress HEENT: Sclerae are anicteric, conjunctiva pink. Oral mucosa intact Lungs: Clear Heart: Regular Abdomen: soft, obese,, nontender, nondistended, no obvious ascites, no peritoneal signs, normal bowel sounds. No organomegaly. Extremities: No edema Psychiatric: alert and oriented x3. Cooperative     ASSESSMENT:  #1. GERD with erosive esophagitis and peptic stricture. Significant breakthrough symptoms on omeprazole 40 mg twice a day. She is taking the medication properly. I suspect her problem is exacerbated by significant progressive weight gain of greater than 50 pounds in 2 years #2. Peptic stricture. No recurrent dysphagia post dilation #3. Diarrhea predominant IBS #4. Morbid obesity #5. Multiple medical and psychiatric problems   PLAN:  #1. Reflux precautions with attention to weight loss #2. Continue omeprazole 40 mg twice daily #3. Add ranitidine 300 mg at night #4. Continue Imodium as needed to control diarrhea #5. Surveillance screening colonoscopy in July 2017, with more vigorous prep #6. Routine GI follow-up one year. Sooner if needed #7. Resume general medical care with PCP

## 2014-08-22 NOTE — Patient Instructions (Addendum)
We have sent the following medications to your pharmacy for you to pick up at your convenience:  Prilosec and Zantac Please follow up with Dr. Henrene Pastor in one year

## 2014-08-24 ENCOUNTER — Telehealth: Payer: Self-pay | Admitting: Cardiology

## 2014-08-24 NOTE — Telephone Encounter (Signed)
Received request from Nurse fax box, documents faxed for surgical clearance. To: [facility] Fax number: [phone] Attention: 1.29.16/km

## 2014-08-28 ENCOUNTER — Telehealth: Payer: Self-pay | Admitting: Cardiology

## 2014-08-28 NOTE — Telephone Encounter (Signed)
F/U         Apolonio Schneiders returning call. Please call back.

## 2014-08-28 NOTE — Telephone Encounter (Signed)
Will forward to Dr McLean for review. 

## 2014-08-28 NOTE — Telephone Encounter (Signed)
Request for surgical clearance:  1. What type of surgery is being performed? Cervical ESI   2. When is this surgery scheduled? 2.8.16   3. Are there any medications that need to be held prior to surgery and how long? Plavix hold for 7days   4. Name of physician performing surgery? Dr Willa Rough   5. What is your office phone and fax number? Phone # [phone]     Please advise if plavix can be held for 7 day instead of the 3 days Dr Aundra Dubin had prior

## 2014-08-28 NOTE — Telephone Encounter (Signed)
F/U     [name] from [facility] calling back, pt is not on Plavix.   Pt takes Aspirin and they are needing clearance from Aspirin just to clarify.

## 2014-08-28 NOTE — Telephone Encounter (Signed)
Received recorded message at [phone] this was the radiology department, [facility] for [facility].

## 2014-08-28 NOTE — Telephone Encounter (Signed)
I believe the doctor is Dr Ron Agee not Willa Rough but I will wait to confirm with Apolonio Schneiders before I send this note.

## 2014-08-28 NOTE — Telephone Encounter (Signed)
OK to hold the aspirin for surgery

## 2014-08-28 NOTE — Telephone Encounter (Signed)
I spoke with Apolonio Schneiders and she asked that I send this message back to Dr Aundra Dubin to let him know pt is having a cervical ESI ,  not surgery.   Apolonio Schneiders also requesting that Dr Aundra Dubin specify that it is [facility] for pt to hold aspirin for 7 days prior to cervical ESI.  I will forward to Dr Aundra Dubin for review and recommendations.

## 2014-08-28 NOTE — Telephone Encounter (Signed)
I will forward this message with Dr Claris Gladden notes to Dr Willa Rough.

## 2014-08-31 ENCOUNTER — Telehealth: Payer: Self-pay | Admitting: Cardiology

## 2014-08-31 NOTE — Telephone Encounter (Signed)
Received request from Nurse fax box:   To: [facility]  Fax number: [phone]

## 2014-09-05 NOTE — Progress Notes (Signed)
This encounter was created in error - please disregard.

## 2014-09-07 ENCOUNTER — Telehealth: Payer: Self-pay | Admitting: Cardiology

## 2014-09-07 ENCOUNTER — Telehealth: Payer: Self-pay | Admitting: Pulmonary Disease

## 2014-09-07 DIAGNOSIS — G4734 Idiopathic sleep related nonobstructive alveolar hypoventilation: Secondary | ICD-10-CM

## 2014-09-07 NOTE — Telephone Encounter (Signed)
Pt wants a call today1 please call [phone]

## 2014-09-07 NOTE — Telephone Encounter (Signed)
New message     Returning a nurses call to let her know that she want to go on oxygen at night.  [facility].

## 2014-09-07 NOTE — Telephone Encounter (Signed)
Patient states she had told Dr. Aundra Dubin that she was holding off on taking night time O2 until she spoke with a second opinion. She is now calling to report that she did speak with her pulmonologist from [facility] (Dr. Gwenette Greet) and he agreed with Dr. Aundra Dubin that patient needs O2 at night. Patient agreeable to get this started. Advised patient that it is after 5 pm on Friday and Dr. Aundra Dubin was not in office today but will return on Monday. Message will be routed to him. Patient was advised to call her pulmonologist back since he is in office today until 5:30pm and see if he would like to order 02 for her if he feels she needs it before Monday. Patient stated she would call him back now.

## 2014-09-07 NOTE — Telephone Encounter (Signed)
Spoke with pt.  Discussed below per [facility].  Pt verbalized understanding.   Pt states "I guess I will" proceed with nocturnal o2. States Dr. Claris Gladden office advised they would order if she decided to proceed with this. She would like to call his office to have this ordered and will call us back if any problems. She voiced no further questions or concerns at this time. Will sign off and route to [facility] as FYI.

## 2014-09-07 NOTE — Telephone Encounter (Signed)
Called and spoke to pt. Informed pt that she should listen to the recs per Dr. Aundra Dubin about pt's O2 as he ordered the ONO. Pt aware message has already been forwarded to [facility] to respond and we will contact her once he does. Pt stated she really doesn't want to wear nocturnal O2 and wants a second opinion in hope she does not need O2. Pt stated her sats were in the 70's during ONO, advised pt again to follow recs made by Dr. Aundra Dubin. Pt verbalized understanding.

## 2014-09-07 NOTE — Telephone Encounter (Signed)
Let her know that I looked at her study, and she dropped her oxygen level fairly low, and spent NINE HOURS less than 88% during the night.  She really needs to be on oxygen at night.

## 2014-09-07 NOTE — Telephone Encounter (Signed)
Spoke with pt, states in December Dr. Aundra Dubin ordered sleep study- pt does not have sleep apnea but her 02 was dropping low qhs.  Since then, Dr. Aundra Dubin has ordered an ONO which showed pt needs 02 qhs.  Pt is wanting to know if [place] agrees with these recs.  Sleep study is in [place] please advise.  Thanks!

## 2014-09-08 NOTE — Telephone Encounter (Signed)
Please order her oxygen at night, thanks.

## 2014-09-10 ENCOUNTER — Other Ambulatory Visit: Payer: Self-pay | Admitting: *Deleted

## 2014-09-10 NOTE — Telephone Encounter (Signed)
2L

## 2014-09-10 NOTE — Addendum Note (Signed)
Addended by: Katrine Coho on: 09/10/2014 05:36 PM   Modules accepted: Orders

## 2014-09-10 NOTE — Telephone Encounter (Signed)
Dr Aundra Dubin -how much oxygen do you want to order for pt at night?

## 2014-09-10 NOTE — Telephone Encounter (Signed)
Pt notified that I will forward order to [facility] for overnight oxygen.   will send a message to [facility] with order for overnight oxygen.

## 2014-09-13 ENCOUNTER — Other Ambulatory Visit: Payer: Self-pay | Admitting: *Deleted

## 2014-09-13 NOTE — Addendum Note (Signed)
Addended by: Katrine Coho on: 09/13/2014 10:02 AM   Modules accepted: Orders

## 2014-10-02 ENCOUNTER — Telehealth: Payer: Self-pay | Admitting: Cardiology

## 2014-10-02 NOTE — Telephone Encounter (Signed)
New message      Pt is on oxygen.  Can oxygen cause joint pain?  Pt is having a lot of joint pain-----see cannot hardly move

## 2014-10-02 NOTE — Telephone Encounter (Signed)
Called patient back. Patient is having joint pain and fatigue. Patient denies any chest pain. Patient thinks this is from having oxygen. Patient had home health at her house this morning. Informed patient that this does not sound like a cardiac issue. Patient has an extensive list of diagnosis. Informed patient to contact her primary doctor for evaluation. Patient verbalized understanding.

## 2014-10-03 DIAGNOSIS — M130 Polyarthritis, unspecified: Secondary | ICD-10-CM | POA: Insufficient documentation

## 2014-10-15 ENCOUNTER — Telehealth: Payer: Self-pay | Admitting: Cardiology

## 2014-10-15 NOTE — Telephone Encounter (Signed)
New Msg      Pt calling, would like a call back.     No details given.

## 2014-10-15 NOTE — Telephone Encounter (Signed)
Pt states her nasal passages are irritated from nasal cannula she is using for oxygen at night.  I advised pt to contact oxygen equipment company for recommendations about options that may cause less irritation to her nasal passages.  Pt verbalized understanding.

## 2014-11-16 ENCOUNTER — Ambulatory Visit (INDEPENDENT_AMBULATORY_CARE_PROVIDER_SITE_OTHER): Payer: BLUE CROSS/BLUE SHIELD | Admitting: Pulmonary Disease

## 2014-11-16 ENCOUNTER — Encounter: Payer: Self-pay | Admitting: Pulmonary Disease

## 2014-11-16 VITALS — BP 156/88 | HR 65 | Temp 98.3°F | Ht 66.0 in | Wt 250.4 lb

## 2014-11-16 DIAGNOSIS — J438 Other emphysema: Secondary | ICD-10-CM | POA: Diagnosis not present

## 2014-11-16 NOTE — Progress Notes (Signed)
   Subjective:    Patient ID: [name], female    DOB: 1955-08-26, 59 y.o.   MRN: [id number]  HPI The patient comes in today for follow-up of her known moderate COPD. She has been staying on her bronchodilator regimen more compliantly, and was recently started on nocturnal oxygen. She currently feels that she is breathing fairly well at a stable baseline, and has not had a recent acute exacerbation. She also feels that her oxygen at night has helped her sleep.   Review of Systems  Constitutional: Negative for fever and unexpected weight change.  HENT: Positive for congestion and postnasal drip. Negative for dental problem, ear pain, nosebleeds, rhinorrhea, sinus pressure, sneezing, sore throat and trouble swallowing.   Eyes: Negative for redness and itching.  Respiratory: Positive for cough, chest tightness, shortness of breath and wheezing.   Cardiovascular: Negative for palpitations and leg swelling.  Gastrointestinal: Negative for nausea and vomiting.  Genitourinary: Negative for dysuria.  Musculoskeletal: Negative for joint swelling.  Skin: Negative for rash.  Neurological: Negative for headaches.  Hematological: Does not bruise/bleed easily.  Psychiatric/Behavioral: Negative for dysphoric mood. The patient is not nervous/anxious.        Objective:   Physical Exam Morbidly obese female in no acute distress Nose without purulence or discharge noted Neck without lymphadenopathy or thyromegaly Chest with mildly decreased breath sounds, no wheezing Cardiac exam with regular rate and rhythm Lower extremities with 1+ edema, no cyanosis Alert and oriented, moves all 4 extremities.       Assessment & Plan:

## 2014-11-16 NOTE — Assessment & Plan Note (Signed)
The patient appears to be doing well on her current bronchodilator regimen. She is also using oxygen at night, and feels that it has helped her sleep. I've asked her to continue on her current meds, and to work aggressively on weight loss and some type of conditioning program.

## 2014-11-16 NOTE — Patient Instructions (Signed)
Continue your current breathing medications Work on weight loss, and some type of exercise program followup again in 2mos.

## 2014-12-04 ENCOUNTER — Emergency Department (HOSPITAL_BASED_OUTPATIENT_CLINIC_OR_DEPARTMENT_OTHER)
Admission: EM | Admit: 2014-12-04 | Discharge: 2014-12-05 | Disposition: A | Discharge status: Home / Self Care | Payer: BLUE CROSS/BLUE SHIELD | Attending: Emergency Medicine | Admitting: Emergency Medicine

## 2014-12-04 ENCOUNTER — Encounter (HOSPITAL_BASED_OUTPATIENT_CLINIC_OR_DEPARTMENT_OTHER): Payer: Self-pay | Admitting: *Deleted

## 2014-12-04 DIAGNOSIS — J441 Chronic obstructive pulmonary disease with (acute) exacerbation: Secondary | ICD-10-CM | POA: Insufficient documentation

## 2014-12-04 DIAGNOSIS — Z7982 Long term (current) use of aspirin: Secondary | ICD-10-CM | POA: Insufficient documentation

## 2014-12-04 DIAGNOSIS — Z862 Personal history of diseases of the blood and blood-forming organs and certain disorders involving the immune mechanism: Secondary | ICD-10-CM | POA: Diagnosis not present

## 2014-12-04 DIAGNOSIS — Z79899 Other long term (current) drug therapy: Secondary | ICD-10-CM | POA: Insufficient documentation

## 2014-12-04 DIAGNOSIS — Z7951 Long term (current) use of inhaled steroids: Secondary | ICD-10-CM | POA: Insufficient documentation

## 2014-12-04 DIAGNOSIS — E785 Hyperlipidemia, unspecified: Secondary | ICD-10-CM | POA: Insufficient documentation

## 2014-12-04 DIAGNOSIS — I252 Old myocardial infarction: Secondary | ICD-10-CM | POA: Insufficient documentation

## 2014-12-04 DIAGNOSIS — K219 Gastro-esophageal reflux disease without esophagitis: Secondary | ICD-10-CM | POA: Diagnosis not present

## 2014-12-04 DIAGNOSIS — N189 Chronic kidney disease, unspecified: Secondary | ICD-10-CM | POA: Insufficient documentation

## 2014-12-04 DIAGNOSIS — I129 Hypertensive chronic kidney disease with stage 1 through stage 4 chronic kidney disease, or unspecified chronic kidney disease: Secondary | ICD-10-CM | POA: Diagnosis not present

## 2014-12-04 DIAGNOSIS — Z87891 Personal history of nicotine dependence: Secondary | ICD-10-CM | POA: Diagnosis not present

## 2014-12-04 DIAGNOSIS — I2510 Atherosclerotic heart disease of native coronary artery without angina pectoris: Secondary | ICD-10-CM | POA: Insufficient documentation

## 2014-12-04 DIAGNOSIS — F3131 Bipolar disorder, current episode depressed, mild: Secondary | ICD-10-CM | POA: Diagnosis not present

## 2014-12-04 DIAGNOSIS — R0602 Shortness of breath: Secondary | ICD-10-CM | POA: Diagnosis present

## 2014-12-04 MED ORDER — ALBUTEROL SULFATE (2.5 MG/3ML) 0.083% IN NEBU
2.5000 mg | INHALATION_SOLUTION | Freq: Once | RESPIRATORY_TRACT | Status: AC
  Administered 2014-12-04: 2.5 mg via RESPIRATORY_TRACT
  Filled 2014-12-04: qty 3

## 2014-12-04 MED ORDER — IPRATROPIUM-ALBUTEROL 0.5-2.5 (3) MG/3ML IN SOLN
3.0000 mL | Freq: Once | RESPIRATORY_TRACT | Status: AC
  Administered 2014-12-04: 3 mL via RESPIRATORY_TRACT
  Filled 2014-12-04: qty 3

## 2014-12-04 NOTE — ED Notes (Signed)
Pt with hx of COPD is here for sob which began today.  No CP with this.  No fever, chills or URI.  Pt is on O2 2L [place] during the night.

## 2014-12-05 ENCOUNTER — Emergency Department (HOSPITAL_BASED_OUTPATIENT_CLINIC_OR_DEPARTMENT_OTHER): Payer: BLUE CROSS/BLUE SHIELD

## 2014-12-05 LAB — BASIC METABOLIC PANEL
Anion gap: 8 (ref 5–15)
BUN: 14 mg/dL (ref 6–20)
CALCIUM: 9 mg/dL (ref 8.9–10.3)
CHLORIDE: 101 mmol/L (ref 101–111)
CO2: 33 mmol/L — AB (ref 22–32)
CREATININE: 1.27 mg/dL — AB (ref 0.44–1.00)
GFR calc Af Amer: 52 mL/min — ABNORMAL LOW (ref >60)
GFR calc non Af Amer: 45 mL/min — ABNORMAL LOW (ref >60)
GLUCOSE: 124 mg/dL — AB (ref 70–99)
POTASSIUM: 3.7 mmol/L (ref 3.5–5.1)
SODIUM: 142 mmol/L (ref 135–145)

## 2014-12-05 LAB — CBC WITH DIFFERENTIAL/PLATELET
Basophils Absolute: 0 10*3/uL (ref 0.0–0.1)
Basophils Relative: 0 % (ref 0–1)
Eosinophils Absolute: 0.3 10*3/uL (ref 0.0–0.7)
Eosinophils Relative: 3 % (ref 0–5)
HCT: 36.8 % (ref 36.0–46.0)
HEMOGLOBIN: 12 g/dL (ref 12.0–15.0)
LYMPHS PCT: 32 % (ref 12–46)
Lymphs Abs: 2.8 10*3/uL (ref 0.7–4.0)
MCH: 33.9 pg (ref 26.0–34.0)
MCHC: 32.6 g/dL (ref 30.0–36.0)
MCV: 104 fL — ABNORMAL HIGH (ref 78.0–100.0)
MONO ABS: 0.4 10*3/uL (ref 0.1–1.0)
MONOS PCT: 5 % (ref 3–12)
NEUTROS PCT: 60 % (ref 43–77)
Neutro Abs: 5.2 10*3/uL (ref 1.7–7.7)
Platelets: 206 10*3/uL (ref 150–400)
RBC: 3.54 MIL/uL — AB (ref 3.87–5.11)
RDW: 13.9 % (ref 11.5–15.5)
WBC: 8.7 10*3/uL (ref 4.0–10.5)

## 2014-12-05 MED ORDER — MAGNESIUM SULFATE 2 GM/50ML IV SOLN
2.0000 g | Freq: Once | INTRAVENOUS | Status: AC
  Administered 2014-12-05: 2 g via INTRAVENOUS
  Filled 2014-12-05: qty 50

## 2014-12-05 MED ORDER — ALBUTEROL SULFATE (2.5 MG/3ML) 0.083% IN NEBU
5.0000 mg | INHALATION_SOLUTION | Freq: Once | RESPIRATORY_TRACT | Status: AC
  Administered 2014-12-05: 5 mg via RESPIRATORY_TRACT
  Filled 2014-12-05: qty 6

## 2014-12-05 NOTE — ED Notes (Signed)
MD at bedside. 

## 2014-12-05 NOTE — ED Provider Notes (Signed)
CSN: [id number]     Arrival date & time 12/04/14  2335 History   First MD Initiated Contact with Patient 12/05/14 0056     Chief Complaint  Patient presents with  . Shortness of Breath     (Consider location/radiation/quality/duration/timing/severity/associated sxs/prior Treatment) HPI  This is a 59 year old female with a history of COPD. She is here with acute exacerbation of her COPD which began yesterday evening about 10:30 PM as she was getting ready for bed. There was associated nausea but no vomiting, diarrhea, chest pain, fever or chills. Symptoms are moderate and were not relieved with home oxygen and albuterol. She has been given albuterol and Atrovent here with partial relief.  Past Medical History  Diagnosis Date  . Anemia, unspecified   . Major depression   . Narcotic abuse in remission   . Bipolar disorder, unspecified   . Dissociative identity disorder   . HTN (hypertension), benign     a. meds d/c'd by PCP 11/13 due to low BP in setting of significant weight loss and poor appetite related to esophagitis (Toprol restarted late Nov 2013)  . HLD (hyperlipidemia)   . GERD (gastroesophageal reflux disease)   . CAD (coronary artery disease)     a. Cypher DES to mLAD in 2007; b. MV 3/09: EF 72%, no ischemia/infarction.; c. MV (12/11) EF 73%, ? Ant soft tissue atten, no ischemia/infarction; d. LHC (1/12): patent LAD stent, mild LIs only; e. Lex MV (6/13): no ischemia or infarction.   . Hiatal hernia   . COPD (chronic obstructive pulmonary disease)     a. quit smoking 06/2010; b. PFTs (4/10):  FEV1 66%, ratio 64%, DLCO 67%  . Palpitations     a. Holter (12/11) with occasional PVCs. Holter (11/12): rare PACs and PVCs.  Marland Kitchen Hx of echocardiogram     a. Echo (1/08): EF 65%, no significant valvular abnormalities.; b. Echo (12/11): EF 55-60%, grade II diast dysfn, normal wall motion, mild LAE.; c. Echo (5/13) with EF 55-60%, mild LVH.   Marland Kitchen Esophageal stricture     a. s/p dilatation  7/12;  b. EGD 04/2012 with severe esophagitis and stricture (PPI started and dilatation deferred for several weeks)  . H/O orthostatic hypotension     probably related to psych meds  . Dysphagia   . Chronic kidney disease   . Myocardial infarction 2007    stent placed   Past Surgical History  Procedure Laterality Date  . Cesarean section    . Bladder surgery      Tack  . Nephrectomy      Part of left kidney  . Carpal tunnel release      BILATERAL HANDS  . Coronary stent placement  2007  . Abdominal hysterectomy     Family History  Problem Relation Age of Onset  . Emphysema Mother   . Ovarian cancer Mother   . Heart disease Father     mi age 65, died with this  . Heart attack Sister 35    died age 56 with MI  . Heart attack Brother 12    died age 4 with MI  . Colon cancer Neg Hx   . Stroke Maternal Grandmother   . Heart attack Mother 67   History  Substance Use Topics  . Smoking status: Former Smoker -- 1.00 packs/day for 40 years    Types: Cigarettes    Quit date: 07/07/2010  . Smokeless tobacco: Never Used     Comment: Pt states she does  smoke an electronic cigarette 11/16/14  . Alcohol Use: No   OB History    No data available     Review of Systems  All other systems reviewed and are negative.   Allergies  Trazodone hcl; Cefpodoxime proxetil; Erythromycin ethylsuccinate; Lisinopril; Nsaids; Tolmetin; Cefpodoxime proxetil; Perphenazine; and Prednisone  Home Medications   Prior to Admission medications   Medication Sig Start Date End Date Taking? Authorizing Provider  acetaminophen (TYLENOL) 325 MG tablet Take 2 tablets (650 mg total) by mouth every 4 (four) hours as needed for headache or mild pain. 02/14/14   Erlene Quan, PA-C  albuterol (PROVENTIL HFA;VENTOLIN HFA) 108 (90 BASE) MCG/ACT inhaler Inhale 2 puffs into the lungs every 6 (six) hours as needed for wheezing or shortness of breath. 12/21/13   Kathee Delton, MD  aspirin EC 81 MG tablet Take 81 mg  by mouth daily.    Historical Provider, MD  atorvastatin (LIPITOR) 80 MG tablet TAKE 1 TABLET BY MOUTH EVERY DAY 08/20/14   Larey Dresser, MD  Cholecalciferol (VITAMIN D) 2000 UNITS tablet Take 2,000 Units by mouth every morning.    Historical Provider, MD  clonazePAM (KLONOPIN) 1 MG tablet Take 1 mg by mouth 3 (three) times daily as needed for anxiety.     Historical Provider, MD  clonazePAM (KLONOPIN) 2 MG tablet Take 2 mg by mouth at bedtime.    Historical Provider, MD  DULoxetine (CYMBALTA) 60 MG capsule Take 60 mg by mouth daily.     Historical Provider, MD  fenofibrate 160 MG tablet Take 1 tablet by mouth daily. 02/10/14   Historical Provider, MD  gabapentin (NEURONTIN) 300 MG capsule Take 300 mg by mouth 3 (three) times daily.    Historical Provider, MD  labetalol (NORMODYNE) 200 MG tablet Take 1 tablet (200 mg total) by mouth 2 (two) times daily. 06/27/14   Larey Dresser, MD  mometasone-formoterol [facility]) 100-5 MCG/ACT AERO Inhale 2 puffs into the lungs 2 (two) times daily.    Historical Provider, MD  nitroGLYCERIN (NITROSTAT) 0.4 MG SL tablet Place 0.4 mg under the tongue every 5 (five) minutes as needed for chest pain.    Historical Provider, MD  Omega-3 Fatty Acids (FISH OIL) 300 MG CAPS Take 300 mg by mouth 2 (two) times daily.     Historical Provider, MD  omeprazole (PRILOSEC) 40 MG capsule Take 1 capsule (40 mg total) by mouth 2 (two) times daily. 08/22/14   Irene Shipper, MD  potassium chloride SA (K-DUR,KLOR-CON) 20 MEQ tablet Take 1 tablet (20 mEq total) by mouth daily. 02/14/14   Erlene Quan, PA-C  Propylene Glycol (SYSTANE BALANCE OP) Apply 1 drop to eye 2 (two) times a week.     Historical Provider, MD  ranitidine (ZANTAC) 300 MG tablet Take 1 tablet (300 mg total) by mouth at bedtime. 08/22/14   Irene Shipper, MD  Tiotropium Bromide Monohydrate (SPIRIVA RESPIMAT) 2.5 MCG/ACT AERS Inhale 2 puffs into the lungs every morning. 06/25/14   Kathee Delton, MD  torsemide (DEMADEX) 10 MG  tablet 2 tablets (20mg ) alternating with 1 tablet (10mg ) daily Patient taking differently: Take 10 mg by mouth once.  02/19/14   Larey Dresser, MD  ziprasidone (GEODON) 80 MG capsule Take 160 mg by mouth at bedtime.     Historical Provider, MD   BP 134/83 mmHg  Pulse 62  Temp(Src) 97.6 F (36.4 C) (Oral)  Resp 11  Ht 5\' 6"  (1.676 m)  Wt 240  lb (108.863 kg)  BMI 38.76 kg/m2  SpO2 95%   Physical Exam  General: Well-developed, well-nourished female in no acute distress; appearance consistent with age of record HENT: normocephalic; atraumatic Eyes: pupils equal, round and reactive to light; extraocular muscles intact Neck: supple Heart: regular rate and rhythm Lungs: Distant sounds; no wheezing; a few right basilar rales Abdomen: soft; nondistended; nontender; bowel sounds present Extremities: No deformity; full range of motion; pulses normal Neurologic: Awake, alert and oriented; motor function intact in all extremities and symmetric; no facial droop Skin: Warm and dry Psychiatric: Flat affect   ED Course  Procedures (including critical care time)   MDM   Nursing notes and vitals signs, including pulse oximetry, reviewed.  Summary of this visit's results, reviewed by myself:   EKG Interpretation  Date/Time:  Tuesday Dec 04 2014 23:42:15 EDT Ventricular Rate:  71 PR Interval:  144 QRS Duration: 96 QT Interval:  430 QTC Calculation: 467 R Axis:   53 Text Interpretation:  Normal sinus rhythm Normal ECG No significant change was found Confirmed by Zafirah Vanzee  MD, Jenny Reichmann ([id number]) on 12/05/2014 12:56:52 AM       Labs:  Results for orders placed or performed during the hospital encounter of 12/04/14 (from the past 24 hour(s))  CBC with Differential     Status: Abnormal   Collection Time: 12/04/14 11:55 PM  Result Value Ref Range   WBC 8.7 4.0 - 10.5 K/uL   RBC 3.54 (L) 3.87 - 5.11 MIL/uL   Hemoglobin 12.0 12.0 - 15.0 g/dL   HCT 36.8 36.0 - 46.0 %   MCV 104.0 (H) 78.0 -  100.0 fL   MCH 33.9 26.0 - 34.0 pg   MCHC 32.6 30.0 - 36.0 g/dL   RDW 13.9 11.5 - 15.5 %   Platelets 206 150 - 400 K/uL   Neutrophils Relative % 60 43 - 77 %   Neutro Abs 5.2 1.7 - 7.7 K/uL   Lymphocytes Relative 32 12 - 46 %   Lymphs Abs 2.8 0.7 - 4.0 K/uL   Monocytes Relative 5 3 - 12 %   Monocytes Absolute 0.4 0.1 - 1.0 K/uL   Eosinophils Relative 3 0 - 5 %   Eosinophils Absolute 0.3 0.0 - 0.7 K/uL   Basophils Relative 0 0 - 1 %   Basophils Absolute 0.0 0.0 - 0.1 K/uL  Basic metabolic panel     Status: Abnormal   Collection Time: 12/04/14 11:55 PM  Result Value Ref Range   Sodium 142 135 - 145 mmol/L   Potassium 3.7 3.5 - 5.1 mmol/L   Chloride 101 101 - 111 mmol/L   CO2 33 (H) 22 - 32 mmol/L   Glucose, Bld 124 (H) 70 - 99 mg/dL   BUN 14 6 - 20 mg/dL   Creatinine, Ser 1.27 (H) 0.44 - 1.00 mg/dL   Calcium 9.0 8.9 - 10.3 mg/dL   GFR calc non Af Amer 45 (L) >60 mL/min   GFR calc Af Amer 52 (L) >60 mL/min   Anion gap 8 5 - 15    Imaging Studies: Dg Chest 2 View  12/05/2014   CLINICAL DATA:  Shortness of breath today.  EXAM: CHEST  2 VIEW  COMPARISON:  02/13/2014  FINDINGS: The heart size and mediastinal contours are within normal limits. Both lungs are clear. The visualized skeletal structures are unremarkable.  IMPRESSION: No active cardiopulmonary disease.   Electronically Signed   By: Lucienne Capers M.D.   On: 12/05/2014 00:53  2:40 AM Patient resting comfortably after additional neb treatment. Oxygen saturation is 96% on 2 liters. No wheezes are heard. Patient states she feels able to go home this time.  Shanon Rosser, MD 12/05/14 [phone]

## 2014-12-21 ENCOUNTER — Ambulatory Visit: Payer: BLUE CROSS/BLUE SHIELD | Admitting: Cardiology

## 2015-01-03 ENCOUNTER — Ambulatory Visit
Admission: RE | Admit: 2015-01-03 | Discharge: 2015-01-03 | Disposition: A | Discharge status: Home / Self Care | Payer: BLUE CROSS/BLUE SHIELD | Source: Ambulatory Visit | Attending: Orthopedic Surgery | Admitting: Orthopedic Surgery

## 2015-01-03 ENCOUNTER — Other Ambulatory Visit: Payer: Self-pay | Admitting: Orthopedic Surgery

## 2015-01-03 DIAGNOSIS — M25551 Pain in right hip: Secondary | ICD-10-CM

## 2015-01-04 ENCOUNTER — Ambulatory Visit: Payer: BLUE CROSS/BLUE SHIELD | Admitting: Internal Medicine

## 2015-01-19 ENCOUNTER — Other Ambulatory Visit: Payer: BLUE CROSS/BLUE SHIELD

## 2015-01-25 ENCOUNTER — Encounter: Payer: Self-pay | Admitting: Internal Medicine

## 2015-02-04 ENCOUNTER — Telehealth: Payer: Self-pay | Admitting: Cardiology

## 2015-02-04 ENCOUNTER — Telehealth: Payer: Self-pay | Admitting: Pulmonary Disease

## 2015-02-04 MED ORDER — AZITHROMYCIN 250 MG PO TABS: 250.0000 mg | ORAL_TABLET | ORAL | Status: DC

## 2015-02-04 NOTE — Telephone Encounter (Signed)
Follow up      Pt called back stating she did not need you to call back.  Pt spoke to pulmonologist regarding issue.

## 2015-02-04 NOTE — Telephone Encounter (Signed)
Called and spoke to pt. Pt c/o increase in SOB, weakness, slight orthopnea x 2 days (pt able to lay on her side). Pt denies swelling, cough, CP/tightness, f/c/s. Pt states she does not like prednisone as this makes her "psychotic". Pt scheduled with Dr. Melvyn Novas on 7.20.16 to transfer care from [facility] to Dr. Melvyn Novas. Offered pt an appt today with TP. Pt refused d/t previous obligations.   Dr. Joya Gaskins please advise. Thanks.

## 2015-02-04 NOTE — Telephone Encounter (Signed)
New message     Pt is having problem with oxygen level doctor put her on.  Pt would like to turn volume up on the machine, please advise if pt can turn oxygen level up.   Please call to advise.

## 2015-02-04 NOTE — Telephone Encounter (Signed)
Rx sent to pharmacy.  Nothing further needed.  

## 2015-02-04 NOTE — Telephone Encounter (Signed)
Call in azithromycin 250mg  Take two once then one daily until gone  #6  Prefer to give depo medrol injection therefore best to have OV

## 2015-02-04 NOTE — Telephone Encounter (Signed)
noted 

## 2015-02-04 NOTE — Telephone Encounter (Signed)
This is the case of all the ABX she is not allergic  i am ok

## 2015-02-04 NOTE — Telephone Encounter (Signed)
Pt aware of recs. Received drug to drug interaction with her geodon stating the following: The rare occurrence of arrhythmias resulting from the potential for additive QT prolongation should be considered as a possibility when ziprasidone and azithromycin are coadministered  Please advise PW thanks

## 2015-02-06 ENCOUNTER — Encounter: Payer: Self-pay | Admitting: *Deleted

## 2015-02-06 ENCOUNTER — Other Ambulatory Visit: Payer: Self-pay | Admitting: Cardiology

## 2015-02-11 ENCOUNTER — Ambulatory Visit: Payer: BLUE CROSS/BLUE SHIELD | Admitting: Internal Medicine

## 2015-02-13 ENCOUNTER — Institutional Professional Consult (permissible substitution): Payer: BLUE CROSS/BLUE SHIELD | Admitting: Internal Medicine

## 2015-02-21 ENCOUNTER — Encounter: Payer: Self-pay | Admitting: Internal Medicine

## 2015-02-21 ENCOUNTER — Ambulatory Visit (INDEPENDENT_AMBULATORY_CARE_PROVIDER_SITE_OTHER): Payer: BLUE CROSS/BLUE SHIELD | Admitting: Internal Medicine

## 2015-02-21 VITALS — BP 142/84 | HR 84 | Temp 98.3°F | Resp 18 | Ht 66.0 in | Wt 248.4 lb

## 2015-02-21 DIAGNOSIS — I10 Essential (primary) hypertension: Secondary | ICD-10-CM | POA: Diagnosis not present

## 2015-02-21 DIAGNOSIS — H0520 Unspecified exophthalmos: Secondary | ICD-10-CM | POA: Diagnosis not present

## 2015-02-21 DIAGNOSIS — F319 Bipolar disorder, unspecified: Secondary | ICD-10-CM

## 2015-02-21 DIAGNOSIS — E785 Hyperlipidemia, unspecified: Secondary | ICD-10-CM

## 2015-02-21 DIAGNOSIS — E119 Type 2 diabetes mellitus without complications: Secondary | ICD-10-CM

## 2015-02-21 NOTE — Progress Notes (Signed)
   Subjective:    Patient ID: [name], female    DOB: Sep 02, 1955, 59 y.o.   MRN: [id number]  HPI The patient is a 59 YO female coming in new for a recent fall. She was up late at night going to the bathroom. She walked into the door jam as she was not able to see that well. She did not fall down and denies LOC. Her eye has swollen some and she has a bruise on her face. She is able to see out of her eyes. No blurred vision. The swelling has gone down some since this morning. She did not try ice on it at all. Mild headache over the bruise on her forehead.   PMH, [facility], social history reviewed and updated.   Review of Systems  Constitutional: Negative for fever, activity change, appetite change, fatigue and unexpected weight change.  Eyes: Positive for pain. Negative for photophobia, discharge, redness, itching and visual disturbance.  Respiratory: Negative.   Cardiovascular: Negative.   Gastrointestinal: Negative for nausea, abdominal pain, diarrhea, constipation and abdominal distention.  Musculoskeletal: Negative.   Skin: Negative.   Neurological: Positive for headaches. Negative for dizziness, syncope, weakness and numbness.  Psychiatric/Behavioral: Negative.       Objective:   Physical Exam  Constitutional: She is oriented to person, place, and time. She appears well-developed and well-nourished.  Overweight  HENT:  Head: Normocephalic.  Small bruise on the parietal region right, some mild swelling of the orbit on the right. No bruising around the orbit and minimal tenderness to palpation. Vision intact.  Eyes: Conjunctivae and EOM are normal. Pupils are equal, round, and reactive to light. Right eye exhibits no discharge. Left eye exhibits no discharge.  Neck: Normal range of motion. No JVD present. No tracheal deviation present.  Cardiovascular: Normal rate and regular rhythm.   Pulmonary/Chest: Effort normal. No respiratory distress. She has no wheezes. She has no  rales. She exhibits no tenderness.  Abdominal: Soft. Bowel sounds are normal. She exhibits no distension. There is no tenderness. There is no rebound.  Musculoskeletal: She exhibits no edema.  Neurological: She is alert and oriented to person, place, and time.  Skin: Skin is warm and dry.   Filed Vitals:   02/21/15 1443  BP: 142/84  Pulse: 84  Temp: 98.3 F (36.8 C)  TempSrc: Oral  Resp: 18  Height: 5\' 6"  (1.676 m)  Weight: 248 lb 6.4 oz (112.674 kg)  SpO2: 93%      Assessment & Plan:

## 2015-02-21 NOTE — Patient Instructions (Signed)
We would like you to use ice on your forehead and eye to help with the swelling. There is no need for any imaging of the head right now. If you start having worsening pain or nausea and vomiting please call us back or seek care in an ER (emergency room).   We do not need to check any blood work today but will check blood work next time.   We would like to see you back in about 3 months. It is a really good idea to do the pulmonary rehab for the lungs and to get you moving again. Any weight you can lose will take pressure off your joints and make your breathing easier.   Fall Prevention and Home Safety Falls cause injuries and can affect all age groups. It is possible to use preventive measures to significantly decrease the likelihood of falls. There are many simple measures which can make your home safer and prevent falls. OUTDOORS  Repair cracks and edges of walkways and driveways.  Remove high doorway thresholds.  Trim shrubbery on the main path into your home.  Have good outside lighting.  Clear walkways of tools, rocks, debris, and clutter.  Check that handrails are not broken and are securely fastened. Both sides of steps should have handrails.  Have leaves, snow, and ice cleared regularly.  Use sand or salt on walkways during winter months.  In the garage, clean up grease or oil spills. BATHROOM  Install night lights.  Install grab bars by the toilet and in the tub and shower.  Use non-skid mats or decals in the tub or shower.  Place a plastic non-slip stool in the shower to sit on, if needed.  Keep floors dry and clean up all water on the floor immediately.  Remove soap buildup in the tub or shower on a regular basis.  Secure bath mats with non-slip, double-sided rug tape.  Remove throw rugs and tripping hazards from the floors. BEDROOMS  Install night lights.  Make sure a bedside light is easy to reach.  Do not use oversized bedding.  Keep a telephone by  your bedside.  Have a firm chair with side arms to use for getting dressed.  Remove throw rugs and tripping hazards from the floor. KITCHEN  Keep handles on pots and pans turned toward the center of the stove. Use back burners when possible.  Clean up spills quickly and allow time for drying.  Avoid walking on wet floors.  Avoid hot utensils and knives.  Position shelves so they are not too high or low.  Place commonly used objects within easy reach.  If necessary, use a sturdy step stool with a grab bar when reaching.  Keep electrical cables out of the way.  Do not use floor polish or wax that makes floors slippery. If you must use wax, use non-skid floor wax.  Remove throw rugs and tripping hazards from the floor. STAIRWAYS  Never leave objects on stairs.  Place handrails on both sides of stairways and use them. Fix any loose handrails. Make sure handrails on both sides of the stairways are as long as the stairs.  Check carpeting to make sure it is firmly attached along stairs. Make repairs to worn or loose carpet promptly.  Avoid placing throw rugs at the top or bottom of stairways, or properly secure the rug with carpet tape to prevent slippage. Get rid of throw rugs, if possible.  Have an electrician put in a light switch at the  top and bottom of the stairs. OTHER FALL PREVENTION TIPS  Wear low-heel or rubber-soled shoes that are supportive and fit well. Wear closed toe shoes.  When using a stepladder, make sure it is fully opened and both spreaders are firmly locked. Do not climb a closed stepladder.  Add color or contrast paint or tape to grab bars and handrails in your home. Place contrasting color strips on first and last steps.  Learn and use mobility aids as needed. Install an electrical emergency response system.  Turn on lights to avoid dark areas. Replace light bulbs that burn out immediately. Get light switches that glow.  Arrange furniture to create  clear pathways. Keep furniture in the same place.  Firmly attach carpet with non-skid or double-sided tape.  Eliminate uneven floor surfaces.  Select a carpet pattern that does not visually hide the edge of steps.  Be aware of all pets. OTHER HOME SAFETY TIPS  Set the water temperature for 120 F (48.8 C).  Keep emergency numbers on or near the telephone.  Keep smoke detectors on every level of the home and near sleeping areas. Document Released: 07/03/2002 Document Revised: 01/12/2012 Document Reviewed: 10/02/2011 [facility] Patient Information 2015 [place], [place]. This information is not intended to replace advice given to you by your health care provider. Make sure you discuss any questions you have with your health care provider.

## 2015-02-21 NOTE — Progress Notes (Signed)
Pre visit review using our clinic review tool, if applicable. No additional management support is needed unless otherwise documented below in the visit note. 

## 2015-02-22 ENCOUNTER — Telehealth: Payer: Self-pay | Admitting: Internal Medicine

## 2015-02-22 DIAGNOSIS — N183 Chronic kidney disease, stage 3 (moderate): Secondary | ICD-10-CM

## 2015-02-22 NOTE — Assessment & Plan Note (Signed)
The injury to her right orbit is not severe. Advised ice on it and can take tylenol for pain relief. No changes to the eyes or vision. If any change to the vision she is to seek care immediately.

## 2015-02-22 NOTE — Telephone Encounter (Signed)
Referral placed.

## 2015-02-22 NOTE — Telephone Encounter (Signed)
Patient is requesting to move her nephrologist to an office in [place]. Please refer to a local nephrologist

## 2015-02-22 NOTE — Telephone Encounter (Signed)
Please advise, thanks.

## 2015-02-22 NOTE — Assessment & Plan Note (Signed)
BP at goal on torsemide, labetalol. Getting records from previous provider to detail complication but appears to be complicated by some CKD. Will need records for accurate staging.

## 2015-02-22 NOTE — Telephone Encounter (Signed)
Tried to call patient back to advise that referral has been placed, no answer, no voicemail, no answering machine, no

## 2015-02-22 NOTE — Assessment & Plan Note (Signed)
Unclear if this is her actual diagnosis. She was not aware. Taking high dose klonopin, geodon, cymbalta from mental health.

## 2015-02-22 NOTE — Assessment & Plan Note (Signed)
Seeing cardiology and currently taking atorvastatin 80 mg daily.

## 2015-02-22 NOTE — Assessment & Plan Note (Signed)
Not currently on any diabetes medications. Will review records to see how she was diagnosed. Looking back through lab records she does have very high sugars on many labs. If no recent HgA1c will check and address as appropriate.

## 2015-02-27 ENCOUNTER — Other Ambulatory Visit: Payer: Self-pay | Admitting: *Deleted

## 2015-02-27 MED ORDER — MOMETASONE FURO-FORMOTEROL FUM 100-5 MCG/ACT IN AERO: 2.0000 | INHALATION_SPRAY | Freq: Two times a day (BID) | RESPIRATORY_TRACT | Status: DC

## 2015-02-27 MED ORDER — TIOTROPIUM BROMIDE MONOHYDRATE 2.5 MCG/ACT IN AERS: 2.0000 | INHALATION_SPRAY | RESPIRATORY_TRACT | Status: DC

## 2015-03-01 ENCOUNTER — Telehealth: Payer: Self-pay | Admitting: Internal Medicine

## 2015-03-01 MED ORDER — MOMETASONE FURO-FORMOTEROL FUM 100-5 MCG/ACT IN AERO: 2.0000 | INHALATION_SPRAY | Freq: Two times a day (BID) | RESPIRATORY_TRACT | Status: AC

## 2015-03-01 NOTE — Telephone Encounter (Signed)
Patient called to let you know that she was seeing a doctor at [facility] for a bulging disc and arthritis who prescribes her gabapentin.  This doctor wants Dr. Doug Sou to take over on this medication. She has about a weeks worth of medication left.

## 2015-03-01 NOTE — Telephone Encounter (Signed)
Rx has been resent to [facility]. Nothing further was needed.

## 2015-03-04 ENCOUNTER — Institutional Professional Consult (permissible substitution): Payer: BLUE CROSS/BLUE SHIELD | Admitting: Internal Medicine

## 2015-03-12 NOTE — Telephone Encounter (Signed)
Pt called in and wanted to know if Dr Doug Sou would send in some gabapentin (NEURONTIN) 300 MG capsule [[id number]] for her .  She states that she has been taking it and it really is helping

## 2015-03-13 MED ORDER — GABAPENTIN 300 MG PO CAPS: 300.0000 mg | ORAL_CAPSULE | Freq: Three times a day (TID) | ORAL | Status: AC

## 2015-03-13 NOTE — Telephone Encounter (Signed)
Sent in 3 months worth and can discuss at next visit.

## 2015-03-15 ENCOUNTER — Encounter: Payer: Self-pay | Admitting: Cardiology

## 2015-03-15 ENCOUNTER — Encounter: Payer: Self-pay | Admitting: *Deleted

## 2015-03-15 ENCOUNTER — Ambulatory Visit (INDEPENDENT_AMBULATORY_CARE_PROVIDER_SITE_OTHER): Payer: BLUE CROSS/BLUE SHIELD | Admitting: Cardiology

## 2015-03-15 ENCOUNTER — Telehealth: Payer: Self-pay | Admitting: Internal Medicine

## 2015-03-15 VITALS — BP 98/70 | Ht 66.0 in | Wt 248.0 lb

## 2015-03-15 DIAGNOSIS — E785 Hyperlipidemia, unspecified: Secondary | ICD-10-CM

## 2015-03-15 DIAGNOSIS — J438 Other emphysema: Secondary | ICD-10-CM

## 2015-03-15 DIAGNOSIS — R002 Palpitations: Secondary | ICD-10-CM

## 2015-03-15 DIAGNOSIS — I2510 Atherosclerotic heart disease of native coronary artery without angina pectoris: Secondary | ICD-10-CM

## 2015-03-15 DIAGNOSIS — I10 Essential (primary) hypertension: Secondary | ICD-10-CM

## 2015-03-15 DIAGNOSIS — I5032 Chronic diastolic (congestive) heart failure: Secondary | ICD-10-CM

## 2015-03-15 DIAGNOSIS — R0602 Shortness of breath: Secondary | ICD-10-CM | POA: Diagnosis not present

## 2015-03-15 LAB — BASIC METABOLIC PANEL
BUN: 8 mg/dL (ref 7–25)
CALCIUM: 8.5 mg/dL — AB (ref 8.6–10.4)
CO2: 34 mmol/L — AB (ref 20–31)
CREATININE: 1.1 mg/dL — AB (ref 0.50–0.99)
Chloride: 99 mmol/L (ref 98–110)
GLUCOSE: 96 mg/dL (ref 65–99)
Potassium: 3.6 mmol/L (ref 3.5–5.3)
SODIUM: 140 mmol/L (ref 135–146)

## 2015-03-15 LAB — LIPID PANEL
CHOL/HDL RATIO: 5.8 ratio — AB (ref <=5.0)
Cholesterol: 157 mg/dL (ref 125–200)
HDL: 27 mg/dL — AB (ref >=46)
LDL Cholesterol: 91 mg/dL (ref <130)
TRIGLYCERIDES: 195 mg/dL — AB (ref <150)
VLDL: 39 mg/dL — ABNORMAL HIGH (ref <30)

## 2015-03-15 NOTE — Patient Instructions (Signed)
Medication Instructions:  No changes today  Labwork: Lipid profile/BMET/BNP today  Testing/Procedures: Your physician has requested that you have an echocardiogram. Echocardiography is a painless test that uses sound waves to create images of your heart. It provides your doctor with information about the size and shape of your heart and how well your heart's chambers and valves are working. This procedure takes approximately one hour. There are no restrictions for this procedure.    Follow-Up: Your physician wants you to follow-up in: 6 months with Dr Aundra Dubin. (February 2017).You will receive a reminder letter in the mail two months in advance. If you don't receive a letter, please call our office to schedule the follow-up appointment.

## 2015-03-15 NOTE — Telephone Encounter (Signed)
Rec'd from [place] forward 75 pages to Dr. Doug Sou

## 2015-03-16 LAB — BRAIN NATRIURETIC PEPTIDE: BRAIN NATRIURETIC PEPTIDE: 52.8 pg/mL (ref 0.0–100.0)

## 2015-03-17 NOTE — Progress Notes (Signed)
Patient ID: [name], female   DOB: 1955-08-25, 59 y.o.   MRN: [id number] PCP: Dr. Anselmo Pickler  59 yo with history of CAD and COPD presents for followup.  Last LHC in 2012 showed patent LAD stent.  Echo in 5/13 showed preserved EF and Lexiscan Cardiolite in 7/14 was normal.  BP has been stable.  She has chronic dyspnea after walking 50-100 feet or walking up steps.  She sleeps on 3 pillows.  No PND.  BP not elevated.   Patient was admitted in 7/15 with atypical chest pain.  She ruled out for MI and Lexiscan Cardiolite was done, showing no evidence for ischemia or infarction.  Since then, she has occasional atypical chest pain.  Sleep study was negative for OSA.   Patient has a history of PVCs/PACs and has occasional episodes of palpitations. No lightheadedness or syncope.   ECG: NSR, anterior T wave flattening, slightly prolonged QT interval.   Labs (12/11): BNP 40  Labs (1/12): HCT 38, D dimer negative, LDL 70, HDL 41, creatinine 1.29, TSH  Labs (10/12): TSH normal, LDL 87, HDL 36, K 3.4, creatinine 1.16 Labs (7/13): LDL 251 Labs (9/13): LDL 115, TGs 195, HDL 36, LFTs normal, K 3.3, creatinine 1.2 Labs (11/13); LDL 64, HDL 35 Labs (1/14): K 3.7, creatinine 1.5 Labs (9/14): K 3.1, creatinine 1.6 Labs (6/15): LDL 111, HDL 49 Labs (7/15): K 4.4, creatinine 1.77, BNP 133 Labs (8/15): K 3.7, creatinine 1.9 Labs (12/15): LDL 120 Labs (5/16): K 3.7, creatinine 1.27, HCT 36.8  Allergies (verified):  1) ! * Vantin  2) Erythromycin Ethylsuccinate (Erythromycin Ethylsuccinate)  3) Cephalexin (Cephalexin)  4) Trazodone Hcl (Trazodone Hcl)   Past Medical History:  1. ANEMIA, NORMOCYTIC  3. DEPRESSION, MAJOR  3. Hx of NARCOTIC ABUSE   4. BIPOLAR DISORDER  5. MULTIPLE PERSONALITY DISORDER  6. HYPERTENSION  7. HYPERLIPIDEMIA  8. G E R D with esophagitis and history of esophageal stricture.  9. C O P D: Quit smoking in 12/11. PFTs (4/10): FEV1 66%, ratio 64%, DLCO 67%.  10.  CORONARY HEART DISEASE: Cypher DES to mid LAD in 2007. Myoview 3/09 with EF 72%, no ischemia or infarction. Myoview (12/11) EF 73%, suspected anterior soft tissue attenuation with no ischemia or infarction.  Left heart cath (1/12): patent LAD stent, mild LIs only.  Lexiscan myoview (6/13) showed no ischemia or infarction.  Lexiscan Cardiolite (7/14) with no ischemia/infarction, EF 75%. Lexiscan Cardiolite (7/15) with EF 71%, no ischemia or infarction.  11. CKD: Partial nephrectomy 1997  12. Diabetes mellitus  13. Echo (1/08): EF 65%, no significant valvular abnormalities. Echo (12/11): EF 55-60%, grade II diastolic dysfunction, normal wall motion, mild left atrial enlargement.  Echo (5/13) with EF 55-60%, mild LVH.  14. Palpitations: Holter (12/11) with occasional PVCs.  Holter (11/12): rare PACs and PVCs.  15. Esophageal stricture s/p dilatation in 7/12.  16. Obesity.  17. Orthostatic hypotension likely related to psych meds 18. Sleep study (1/16) was negative for OSA.   Family History:  emphysema: mother  asthma: mother  heart disease: father with MI at 6, brother and sister with MIs in their 47s.  Sister died with MI at 40.   cancer: mother (ovarian)   Social History:  pt is married with children, lives in [place].  Unemployed  Tobacco Use - Former. 07/07/10  ROS: All systems reviewed and negative except as per HPI.   Current Outpatient Prescriptions  Medication Sig Dispense Refill  . acetaminophen (TYLENOL) 325 MG  tablet Take 2 tablets (650 mg total) by mouth every 4 (four) hours as needed for headache or mild pain.    Marland Kitchen albuterol (PROVENTIL HFA;VENTOLIN HFA) 108 (90 BASE) MCG/ACT inhaler Inhale 2 puffs into the lungs every 6 (six) hours as needed for wheezing or shortness of breath. 1 Inhaler 1  . aspirin EC 81 MG tablet Take 81 mg by mouth daily.    Marland Kitchen atorvastatin (LIPITOR) 80 MG tablet TAKE 1 TABLET BY MOUTH EVERY DAY 30 tablet 5  . clonazePAM (KLONOPIN) 1 MG tablet Take 1 mg  by mouth 3 (three) times daily as needed for anxiety.     . clonazePAM (KLONOPIN) 2 MG tablet Take 2 mg by mouth at bedtime.    . DULoxetine (CYMBALTA) 60 MG capsule Take 60 mg by mouth daily.     Marland Kitchen gabapentin (NEURONTIN) 300 MG capsule Take 1 capsule (300 mg total) by mouth 3 (three) times daily. 90 capsule 3  . HYDROcodone-acetaminophen (NORCO/VICODIN) 5-325 MG per tablet Take 1 tablet by mouth daily.    Marland Kitchen labetalol (NORMODYNE) 200 MG tablet TAKE 1 TABLET BY MOUTH TWICE DAILY 60 tablet 1  . mometasone-formoterol (DULERA) 100-5 MCG/ACT AERO Inhale 2 puffs into the lungs 2 (two) times daily. 1 Inhaler 0  . nitroGLYCERIN (NITROSTAT) 0.4 MG SL tablet Place 0.4 mg under the tongue every 5 (five) minutes as needed for chest pain.    . Omega-3 Fatty Acids (FISH OIL) 300 MG CAPS Take 300 mg by mouth 2 (two) times daily.     Marland Kitchen omeprazole (PRILOSEC) 40 MG capsule Take 1 capsule (40 mg total) by mouth 2 (two) times daily. 60 capsule 6  . ranitidine (ZANTAC) 300 MG tablet Take 1 tablet (300 mg total) by mouth at bedtime. 30 tablet 6  . Tiotropium Bromide Monohydrate (SPIRIVA RESPIMAT) 2.5 MCG/ACT AERS Inhale 2 puffs into the lungs every morning. 4 g 0  . torsemide (DEMADEX) 10 MG tablet 2 tablets (20mg ) alternating with 1 tablet (10mg ) daily (Patient taking differently: Take 10 mg by mouth once. ) 45 tablet 6  . ziprasidone (GEODON) 80 MG capsule Take 160 mg by mouth at bedtime.     . potassium chloride SA (K-DUR,KLOR-CON) 20 MEQ tablet Take 1 tablet (20 mEq total) by mouth daily. (Patient not taking: Reported on 03/15/2015) 60 tablet 3   No current facility-administered medications for this visit.    BP 98/70 mmHg  Ht 5\' 6"  (1.676 m)  Wt 248 lb (112.492 kg)  BMI 40.05 kg/m2 General: NAD, obese.  Neck: Thick neck, JVP not elevated, no thyromegaly or thyroid nodule.  Lungs: CTAB CV: Nondisplaced PMI.  Heart regular S1/S2, no S3/S4, no murmur.  No edema.  No carotid bruit.  Normal pedal pulses.   Abdomen: Soft, nontender, no hepatosplenomegaly, no distention.  Neurologic: Alert and oriented x 3.  Psych: Normal affect. Extremities: No clubbing or cyanosis.   Assessment/Plan: 1. CAD: She had a negative Cardiolite in 7/15 after episode of atypical chest pain.  No worrisome chest pain recently.  Continue ASA, ACEI, beta blocker, statin.  2. Hyperlipidemia: Repeat lipids today.  If LDL remains higher than goal (<70), would like to replace atorvastatin with Crestor 40 daily.  3. HTN: BP not elevated.   4. Obesity: Continue efforts at weight loss.    5. COPD: Followed by pulmonary, I think that this is a major contributor to her dyspnea.  6. Chronic diastolic CHF: NYHA class III symptoms.  Dyspnea likely combination of COPD and  diastolic CHF.  She is not volume overloaded today, continue current torsemide.  BMET/BNP today.  I will get an echo to make sure that LV function remains preserved.   Followup in 6 months.   Loralie Champagne 03/17/2015

## 2015-03-18 ENCOUNTER — Other Ambulatory Visit: Payer: Self-pay | Admitting: *Deleted

## 2015-03-18 DIAGNOSIS — E785 Hyperlipidemia, unspecified: Secondary | ICD-10-CM

## 2015-03-18 MED ORDER — ROSUVASTATIN CALCIUM 40 MG PO TABS: 40.0000 mg | ORAL_TABLET | Freq: Every day | ORAL | Status: AC

## 2015-03-21 ENCOUNTER — Ambulatory Visit (HOSPITAL_COMMUNITY): Payer: BLUE CROSS/BLUE SHIELD

## 2015-03-22 ENCOUNTER — Telehealth: Payer: Self-pay | Admitting: Internal Medicine

## 2015-03-22 NOTE — Telephone Encounter (Signed)
Attempted to call pt back, no answer and not able to leave a message.

## 2015-03-25 ENCOUNTER — Other Ambulatory Visit: Payer: Self-pay | Admitting: Internal Medicine

## 2015-03-25 NOTE — Telephone Encounter (Signed)
Pt states she is taking omeprazole 40mg  BID and Zantac 300mg  q hs. States she is taking tums and drinking milk in addition to the meds due to epigastric pain and burning in her chest. Pt states the meds are not helping. Please advise.

## 2015-03-26 ENCOUNTER — Ambulatory Visit (INDEPENDENT_AMBULATORY_CARE_PROVIDER_SITE_OTHER): Payer: BLUE CROSS/BLUE SHIELD | Admitting: Internal Medicine

## 2015-03-26 ENCOUNTER — Ambulatory Visit (INDEPENDENT_AMBULATORY_CARE_PROVIDER_SITE_OTHER)
Admission: RE | Admit: 2015-03-26 | Discharge: 2015-03-26 | Disposition: A | Discharge status: Home / Self Care | Payer: BLUE CROSS/BLUE SHIELD | Source: Ambulatory Visit | Attending: Internal Medicine | Admitting: Internal Medicine

## 2015-03-26 ENCOUNTER — Encounter: Payer: Self-pay | Admitting: Internal Medicine

## 2015-03-26 ENCOUNTER — Other Ambulatory Visit (INDEPENDENT_AMBULATORY_CARE_PROVIDER_SITE_OTHER): Payer: BLUE CROSS/BLUE SHIELD

## 2015-03-26 VITALS — BP 112/70 | HR 73 | Ht 66.0 in | Wt 249.0 lb

## 2015-03-26 DIAGNOSIS — R0600 Dyspnea, unspecified: Secondary | ICD-10-CM

## 2015-03-26 DIAGNOSIS — J449 Chronic obstructive pulmonary disease, unspecified: Secondary | ICD-10-CM

## 2015-03-26 DIAGNOSIS — J9612 Chronic respiratory failure with hypercapnia: Secondary | ICD-10-CM | POA: Diagnosis not present

## 2015-03-26 LAB — CBC WITH DIFFERENTIAL/PLATELET
BASOS PCT: 0.4 % (ref 0.0–3.0)
Basophils Absolute: 0 10*3/uL (ref 0.0–0.1)
EOS PCT: 3.5 % (ref 0.0–5.0)
Eosinophils Absolute: 0.3 10*3/uL (ref 0.0–0.7)
HCT: 35 % — ABNORMAL LOW (ref 36.0–46.0)
Hemoglobin: 11.4 g/dL — ABNORMAL LOW (ref 12.0–15.0)
LYMPHS ABS: 2 10*3/uL (ref 0.7–4.0)
Lymphocytes Relative: 23 % (ref 12.0–46.0)
MCHC: 32.6 g/dL (ref 30.0–36.0)
MCV: 105 fl — ABNORMAL HIGH (ref 78.0–100.0)
Monocytes Absolute: 0.3 10*3/uL (ref 0.1–1.0)
Monocytes Relative: 3.5 % (ref 3.0–12.0)
NEUTROS PCT: 69.6 % (ref 43.0–77.0)
Neutro Abs: 6.2 10*3/uL (ref 1.4–7.7)
Platelets: 211 10*3/uL (ref 150.0–400.0)
RBC: 3.33 Mil/uL — AB (ref 3.87–5.11)
RDW: 16 % — ABNORMAL HIGH (ref 11.5–15.5)
WBC: 8.9 10*3/uL (ref 4.0–10.5)

## 2015-03-26 LAB — TSH: TSH: 1.92 u[IU]/mL (ref 0.35–4.50)

## 2015-03-26 NOTE — Patient Instructions (Addendum)
Please remember to go to the lab and x-ray department downstairs for your tests - we will call you with the results when they are available  If you have asthma, we will need to consider taking you off the normodyne as it blocks the benefit of dulera and ventolin  Be sure you take prilosec Take 30- 60 min before your first and last meals of the day   GERD (REFLUX)  is an extremely common cause of respiratory symptoms just like yours , many times with no obvious heartburn at all.    It can be treated with medication, but also with lifestyle changes including elevation of the head of your bed (ideally with 6 inch  bed blocks),  Smoking cessation, avoidance of late meals, excessive alcohol, and avoid fatty foods, chocolate, peppermint, colas, red wine, and acidic juices such as orange juice.  NO MINT OR MENTHOL PRODUCTS SO NO COUGH DROPS  USE SUGARLESS CANDY INSTEAD (Jolley ranchers or Stover's or Life Savers) or even ice chips will also do - the key is to swallow to prevent all throat clearing. NO OIL BASED VITAMINS - use powdered substitutes.  You have very little evidence of airflow obstruction presently  - no pulmonary follow up is needed at this point

## 2015-03-26 NOTE — Telephone Encounter (Signed)
Unable to reach pt at either number.

## 2015-03-26 NOTE — Progress Notes (Signed)
Quick Note:  ATC, NA and no option to leave a msg ______ 

## 2015-03-26 NOTE — Progress Notes (Signed)
Patient ID: [name], female   DOB: 02-15-1956, 59 y.o.   MRN: [id number]   Brief patient profile:  1  yowf MO quit  Smoking 2011  with dx of copd by Dr [name] referred by Dr [name]  With GOLD III criteria  at intial pulmonary  eval by [name] 03/26/2015 with more restrictive(from obesity)  than obstructive changes    History of Present Illness  03/26/2015 1st   office visit/ [name] re: worse sob x 2 weeks  On spriva/ dulera 100 and normodyne  Chief Complaint  Patient presents with  . Pulmonary Consult    Former Dr [name] pt. She c/o increased SOB "at any time" for the past 2 wks. She is using ventolin 4 x daily on average.   breathing is worse immediately lying down unless wears 02 does fine on 02 hs/ sometime sob at rest daytime rarely using 02 during the day/breathing better p saba despite normodyne rx   Doe x  50 fts better p inhaler    No obvious day to day or daytime variability or assoc chronic cough or cp or chest tightness, subjective wheeze or overt sinus or hb symptoms. No unusual exp hx or h/o childhood pna/ asthma or knowledge of premature birth.  Sleeping ok without nocturnal  or early am exacerbation  of respiratory  c/o's or need for noct saba. Also denies any obvious fluctuation of symptoms with weather or environmental changes or other aggravating or alleviating factors except as outlined above   Current Medications, Allergies, Complete Past Medical History, Past Surgical History, Family History, and Social History were reviewed in [employer] [facility] record.  ROS  The following are not active complaints unless bolded sore throat, dysphagia, dental problems, itching, sneezing,  nasal congestion or excess/ purulent secretions, ear ache,   fever, chills, sweats, unintended wt loss, classically pleuritic or exertional cp, hemoptysis,  orthopnea pnd or leg swelling, presyncope, palpitations, abdominal pain, anorexia, nausea, vomiting, diarrhea  or change in  bowel or bladder habits, change in stools or urine, dysuria,hematuria,  rash, arthralgias, visual complaints, headache, numbness, weakness or ataxia or problems with walking or coordination,  change in mood/affect or memory.      Objective:   Physical Exam   amb massively obese wf nad    Wt Readings from Last 3 Encounters:  03/26/15 249 lb (112.946 kg)  03/15/15 248 lb (112.492 kg)  02/21/15 248 lb 6.4 oz (112.674 kg)    Vital signs reviewed      HEENT: nl dentition, turbinates, and orophanx. Nl external ear canals without cough reflex   NECK :  without JVD/Nodes/TM/ nl carotid upstrokes bilaterally   LUNGS: no acc muscle use, clear to A and P bilaterally without cough on insp or exp maneuvers   CV:  RRR  no s3 or murmur or increase in P2, no edema   ABD:  soft and nontender with nl excursion in the supine position. No bruits or organomegaly, bowel sounds nl  MS:  warm without deformities, calf tenderness, cyanosis or clubbing  SKIN: warm and dry without lesions    NEURO:  alert, approp, no deficits     I personally reviewed images and agree with radiology impression as follows:  CXR:   03/26/15 There is no active cardiopulmonary disease.  Labs ordered/ reviewed:     Chemistry      Component Value Date/Time   NA 140 03/15/2015 1702   K 3.6 03/15/2015 1702   CL 99 03/15/2015  1702   CO2 34* 03/15/2015 1702   BUN 8 03/15/2015 1702   CREATININE 1.10* 03/15/2015 1702   CREATININE 1.27* 12/04/2014 2355      Component Value Date/Time   CALCIUM 8.5* 03/15/2015 1702   ALKPHOS 28* 02/13/2014 1632   AST 12 02/13/2014 1632   ALT 12 02/13/2014 1632   BILITOT 0.3 02/13/2014 1632        Lab 03/26/15 1527  HGB 11.4*  HCT 35.0*  WBC 8.9  PLT 211.0     Lab Results  Component Value Date   TSH 1.92 03/26/2015     BNP 53  03/15/15         Assessment:

## 2015-03-26 NOTE — Telephone Encounter (Signed)
See extender when available. In meantime, reflux precautions with weight loss

## 2015-03-27 ENCOUNTER — Encounter: Payer: Self-pay | Admitting: Internal Medicine

## 2015-03-27 ENCOUNTER — Telehealth: Payer: Self-pay | Admitting: *Deleted

## 2015-03-27 DIAGNOSIS — J9612 Chronic respiratory failure with hypercapnia: Secondary | ICD-10-CM | POA: Insufficient documentation

## 2015-03-27 NOTE — Progress Notes (Signed)
Quick Note:  Spoke with pt and notified of results per Dr. Wert. Pt verbalized understanding and denied any questions.  ______ 

## 2015-03-27 NOTE — Assessment & Plan Note (Signed)
HC03   03/15/15 = 34   She is already on nocturnal oxygen. She does not need daytime oxygen based on her walking sat today. However, I'm concerned because of the hypercarbia that she's most likely developing obesity hypoventilation syndrome. The record indicates she has a diagnosis of sleep apnea but not clear who follows her for it. I'm going to refer her to one of our sleep doctors (if she doesn't have one already) to see if perhaps she would be better off switch to a BiPAP at at bedtime

## 2015-03-27 NOTE — Telephone Encounter (Signed)
Pt aware. Pt scheduled to see Amy Esterwood PA Apr 16, 2015@2 :30pm. Pt aware of appt.

## 2015-03-27 NOTE — Assessment & Plan Note (Addendum)
PFT's 2010:  FEV1 1.73 (66%), ratio 69, no restriction, DLCO 67% PFT's 2015:  FEV1 0.97 (37%),ratio 59 AAT 2015:  Normal level, MM - 03/26/2015   Walked RA x one lap @ 185 stopped due to  Sob and hip pain, no desat at slow  pace  - Spirometry 03/26/2015 >   FEV1  1.0 (35%) ratio 69  Based on today's history exam and spirometry as well as her walking study, her main problem is obesity, not unstable airways disease. If her airways to disease does become unstable, I would strongly favor a trial off of Normodyne -   prefer in this setting: Bystolic, the most beta -1  selective Beta blocker available in sample form, with bisoprolol the most selective generic choice  on the market.   I had an extended discussion with the patient reviewing all relevant studies completed to date and  lasting 63 m   Each maintenance medication was reviewed in detail including most importantly the difference between maintenance and prns and under what circumstances the prns are to be triggered using an action plan format that is not reflected in the computer generated alphabetically organized AVS.    Please see instructions for details which were reviewed in writing and the patient given a copy highlighting the part that I personally wrote and discussed at today's ov.

## 2015-03-27 NOTE — Telephone Encounter (Signed)
-----   Message from Tanda Rockers, MD sent at 03/27/2015 10:04 AM EDT ----- Let her know :   I'm concerned because of the hypercarbia that she's most likely developing obesity hypoventilation syndrome. The record indicates she has a diagnosis of sleep apnea but not clear who follows her for it.    REC  refer her to one of our sleep doctors (if she doesn't have one already) to see if perhaps she would be better off switch to a BiPAP

## 2015-03-27 NOTE — Telephone Encounter (Signed)
Spoke with the pt and she states does not see a sleep doc b/c she was told after having PSG that she does not have sleep apnea  MW aware

## 2015-03-27 NOTE — Assessment & Plan Note (Signed)
Body mass index is 40.21 kg/(m^2).  Lab Results  Component Value Date   TSH 1.92 03/26/2015     Contributing to gerd tendency/ doe/reviewed need  achieve and maintain neg calorie balance > defer f/u primary care including intermittently monitoring thyroid status

## 2015-03-28 ENCOUNTER — Ambulatory Visit (HOSPITAL_COMMUNITY): Payer: BLUE CROSS/BLUE SHIELD

## 2015-04-05 ENCOUNTER — Telehealth: Payer: Self-pay | Admitting: Internal Medicine

## 2015-04-05 NOTE — Telephone Encounter (Signed)
Pt called stated she might have uti due to odor,cloudy and blood in her urine. Offer an appt for sat clinic but pt stated she can not come. Pt was wondering if Dr. Doug Sou can give her something for this. Please advise.

## 2015-04-05 NOTE — Telephone Encounter (Signed)
Would recommend that she is seen somewhere or if she can leave a sample downstairs.

## 2015-04-05 NOTE — Telephone Encounter (Signed)
[facility] Patient Name: [name] SS DOB: Mar 28, 1956 Initial Comment Caller states that has frequency, urgency, cloudy urine, blood in urine. Wants advice on what can do over the weekend Nurse Assessment Nurse: Martyn Ehrich, RN, Solmon Ice Date/Time Eilene Ghazi Time): 04/05/2015 4:21:37 PM Confirm and document reason for call. If symptomatic, describe symptoms. ---Pt has frequency, pain on urination onset 2 weeks ago and blood in urine onset 3 d ago - no fever. Has the patient traveled out of the country within the last 30 days? ---No Does the patient require triage? ---Yes Related visit to physician within the last 2 weeks? ---No Does the PT have any chronic conditions? (i.e. diabetes, asthma, etc.) ---NoGuidelines Guideline Title Affirmed Question Affirmed Notes Urine - Blood In Side (flank) or back pain present Final Disposition User See Physician within 24 Hours [place], RN, [place] Ice Comments gave caller [facility] appt no. Referrals [facility] Disagree/Comply: Comply Call Id: [id number]

## 2015-04-05 NOTE — Telephone Encounter (Signed)
Patient cannot make it in by 5 to leave a sample in the lab. I informed her to go to the ER, or urgent care, due to the blood in her urine.

## 2015-04-07 ENCOUNTER — Other Ambulatory Visit: Payer: Self-pay | Admitting: Cardiology

## 2015-04-07 ENCOUNTER — Encounter (HOSPITAL_COMMUNITY): Payer: Self-pay | Admitting: Emergency Medicine

## 2015-04-07 ENCOUNTER — Emergency Department (HOSPITAL_COMMUNITY)
Admission: EM | Admit: 2015-04-07 | Discharge: 2015-04-27 | Disposition: E | Payer: BLUE CROSS/BLUE SHIELD | Attending: Emergency Medicine | Admitting: Emergency Medicine

## 2015-04-07 DIAGNOSIS — Z862 Personal history of diseases of the blood and blood-forming organs and certain disorders involving the immune mechanism: Secondary | ICD-10-CM | POA: Insufficient documentation

## 2015-04-07 DIAGNOSIS — I129 Hypertensive chronic kidney disease with stage 1 through stage 4 chronic kidney disease, or unspecified chronic kidney disease: Secondary | ICD-10-CM | POA: Diagnosis not present

## 2015-04-07 DIAGNOSIS — Z79899 Other long term (current) drug therapy: Secondary | ICD-10-CM | POA: Diagnosis not present

## 2015-04-07 DIAGNOSIS — N189 Chronic kidney disease, unspecified: Secondary | ICD-10-CM | POA: Diagnosis not present

## 2015-04-07 DIAGNOSIS — A419 Sepsis, unspecified organism: Secondary | ICD-10-CM | POA: Diagnosis not present

## 2015-04-07 DIAGNOSIS — I252 Old myocardial infarction: Secondary | ICD-10-CM | POA: Diagnosis not present

## 2015-04-07 DIAGNOSIS — I469 Cardiac arrest, cause unspecified: Secondary | ICD-10-CM | POA: Diagnosis not present

## 2015-04-07 DIAGNOSIS — J449 Chronic obstructive pulmonary disease, unspecified: Secondary | ICD-10-CM | POA: Insufficient documentation

## 2015-04-07 DIAGNOSIS — R531 Weakness: Secondary | ICD-10-CM | POA: Diagnosis present

## 2015-04-07 DIAGNOSIS — Z87891 Personal history of nicotine dependence: Secondary | ICD-10-CM | POA: Insufficient documentation

## 2015-04-07 DIAGNOSIS — Z7951 Long term (current) use of inhaled steroids: Secondary | ICD-10-CM | POA: Insufficient documentation

## 2015-04-07 DIAGNOSIS — F319 Bipolar disorder, unspecified: Secondary | ICD-10-CM | POA: Diagnosis not present

## 2015-04-07 DIAGNOSIS — I2510 Atherosclerotic heart disease of native coronary artery without angina pectoris: Secondary | ICD-10-CM | POA: Insufficient documentation

## 2015-04-07 DIAGNOSIS — E785 Hyperlipidemia, unspecified: Secondary | ICD-10-CM | POA: Diagnosis not present

## 2015-04-07 DIAGNOSIS — Z7982 Long term (current) use of aspirin: Secondary | ICD-10-CM | POA: Insufficient documentation

## 2015-04-07 DIAGNOSIS — K219 Gastro-esophageal reflux disease without esophagitis: Secondary | ICD-10-CM | POA: Diagnosis not present

## 2015-04-07 MED ORDER — SODIUM CHLORIDE 0.9 % IV BOLUS (SEPSIS)
1000.0000 mL | Freq: Once | INTRAVENOUS | Status: AC
  Administered 2015-04-08: 1000 mL via INTRAVENOUS

## 2015-04-07 NOTE — ED Provider Notes (Signed)
TIME SEEN: 11:40 PM   CHIEF COMPLAINT: Weakness, fever, falls, dysuria  HPI: HPI Comments: [name] is a 59 y.o. female with h/o CAD, HTN, HLD, COPD on 2L O2 at night who presents to the Emergency Department complaining of weakness onset 3-4 days prior. Pt reports associated fever, dysuria, hematuria and diarrhea. Pt states that she has a HX of kidney infections and this feels similar.Family member states that she has been non complaint with her oxygen at night due to the machine making her hot. Pt does report falling and injuring her right great toe yesterday morning. She states that she did hit her head but denies LOC. Pt states that over the past few days during the onset of her symptoms her mobility has been increasingly getting worse. Family member states that she has been falling more frequently over the past year. Denies CP, SOB, cough, vomiting, extremity pain or neck pain. No numbness or focal weakness.  PCP Show Low  ROS: See HPI Constitutional: fever  Eyes: no drainage  ENT: no runny nose   Cardiovascular:  no chest pain  Resp: no SOB  GI: no vomiting GU: dysuria Integumentary: no rash  Allergy: no hives  Musculoskeletal: no leg swelling  Neurological: no slurred speech ROS otherwise negative  PAST MEDICAL HISTORY/PAST SURGICAL HISTORY:  Past Medical History  Diagnosis Date  . Anemia, unspecified   . Major depression   . Narcotic abuse in remission   . Bipolar disorder, unspecified   . Dissociative identity disorder   . HTN (hypertension), benign     a. meds d/c'd by PCP 11/13 due to low BP in setting of significant weight loss and poor appetite related to esophagitis (Toprol restarted late Nov 2013)  . HLD (hyperlipidemia)   . GERD (gastroesophageal reflux disease)   . CAD (coronary artery disease)     a. Cypher DES to mLAD in 2007; b. MV 3/09: EF 72%, no ischemia/infarction.; c. MV (12/11) EF 73%, ? Ant soft tissue atten, no ischemia/infarction; d. LHC  (1/12): patent LAD stent, mild LIs only; e. Lex MV (6/13): no ischemia or infarction.   . Hiatal hernia   . COPD (chronic obstructive pulmonary disease)     a. quit smoking 06/2010; b. PFTs (4/10):  FEV1 66%, ratio 64%, DLCO 67%  . Palpitations     a. Holter (12/11) with occasional PVCs. Holter (11/12): rare PACs and PVCs.  Marland Kitchen Hx of echocardiogram     a. Echo (1/08): EF 65%, no significant valvular abnormalities.; b. Echo (12/11): EF 55-60%, grade II diast dysfn, normal wall motion, mild LAE.; c. Echo (5/13) with EF 55-60%, mild LVH.   Marland Kitchen Esophageal stricture     a. s/p dilatation 7/12;  b. EGD 04/2012 with severe esophagitis and stricture (PPI started and dilatation deferred for several weeks)  . H/O orthostatic hypotension     probably related to psych meds  . Dysphagia   . Chronic kidney disease   . Myocardial infarction 2007    stent placed    MEDICATIONS:  Prior to Admission medications   Medication Sig Start Date End Date Taking? Authorizing Provider  acetaminophen (TYLENOL) 325 MG tablet Take 2 tablets (650 mg total) by mouth every 4 (four) hours as needed for headache or mild pain. 02/14/14   Erlene Quan, PA-C  albuterol (PROVENTIL HFA;VENTOLIN HFA) 108 (90 BASE) MCG/ACT inhaler Inhale 2 puffs into the lungs every 6 (six) hours as needed for wheezing or shortness of breath. 12/21/13   Lanny Hurst  Cloyde Reams, MD  aspirin EC 81 MG tablet Take 81 mg by mouth daily.    Historical Provider, MD  clonazePAM (KLONOPIN) 1 MG tablet Take 1 mg by mouth 3 (three) times daily as needed for anxiety.     Historical Provider, MD  clonazePAM (KLONOPIN) 2 MG tablet Take 2 mg by mouth at bedtime.    Historical Provider, MD  DULoxetine (CYMBALTA) 60 MG capsule Take 60 mg by mouth daily.     Historical Provider, MD  gabapentin (NEURONTIN) 300 MG capsule Take 1 capsule (300 mg total) by mouth 3 (three) times daily. 03/13/15   Olga Millers, MD  labetalol (NORMODYNE) 200 MG tablet TAKE 1 TABLET BY MOUTH TWICE  DAILY 02/06/15   Larey Dresser, MD  mometasone-formoterol [facility]) 100-5 MCG/ACT AERO Inhale 2 puffs into the lungs 2 (two) times daily. 03/01/15   Tanda Rockers, MD  nitroGLYCERIN (NITROSTAT) 0.4 MG SL tablet Place 0.4 mg under the tongue every 5 (five) minutes as needed for chest pain.    Historical Provider, MD  Omega-3 Fatty Acids (FISH OIL) 300 MG CAPS Take 300 mg by mouth 2 (two) times daily.     Historical Provider, MD  omeprazole (PRILOSEC) 40 MG capsule Take 1 capsule (40 mg total) by mouth 2 (two) times daily. 08/22/14   Irene Shipper, MD  ranitidine (ZANTAC) 300 MG tablet Take 1 tablet (300 mg total) by mouth at bedtime. 08/22/14   Irene Shipper, MD  rosuvastatin (CRESTOR) 40 MG tablet Take 1 tablet (40 mg total) by mouth daily. 03/18/15   Larey Dresser, MD  SPIRIVA RESPIMAT 2.5 MCG/ACT AERS INHALE 2 PUFFS INTO THE LUNGS EVERY MORNING 03/26/15   Tanda Rockers, MD  torsemide (DEMADEX) 10 MG tablet Take 10 mg by mouth daily as needed.    Historical Provider, MD  ziprasidone (GEODON) 80 MG capsule Take 160 mg by mouth at bedtime.     Historical Provider, MD    ALLERGIES:  Allergies  Allergen Reactions  . Trazodone Hcl Anaphylaxis  . Cefpodoxime Proxetil Other (See Comments)    unknown  . Erythromycin Ethylsuccinate Nausea And Vomiting  . Lisinopril Other (See Comments)    Cough  . Nsaids     Other reaction(s): Other (See Comments) Kidney disease  . Sulfa Antibiotics Swelling  . Tolmetin     Other reaction(s): Other (See Comments) Unknown.  Marland Kitchen Cefpodoxime Proxetil Other (See Comments)    unknown  . Perphenazine Other (See Comments)    unknown unknown  . Prednisone Anxiety    Gets emotional.    SOCIAL HISTORY:  Social History  Substance Use Topics  . Smoking status: Former Smoker -- 1.00 packs/day for 40 years    Types: Cigarettes    Quit date: 07/07/2010  . Smokeless tobacco: Never Used     Comment: Pt states she does smoke an electronic cigarette 11/16/14  . Alcohol  Use: No    FAMILY HISTORY: Family History  Problem Relation Age of Onset  . Emphysema Mother   . Ovarian cancer Mother   . Heart disease Father     mi age 77, died with this  . Heart attack Sister 2    died age 31 with MI  . Heart attack Brother 50    died age 38 with MI  . Colon cancer Neg Hx   . Stroke Maternal Grandmother   . Heart attack Mother 58    EXAM: BP 110/55 mmHg  Pulse 63  Temp(Src) 101.8 F (38.8 C)  SpO2 97%   CONSTITUTIONAL: Alert and oriented and responds appropriately to questions. Chronically ill appearing, obese, febrile but non toxic HEAD: Normocephalic EYES: Conjunctivae clear, PERRL ENT: normal nose; no rhinorrhea; slightly dry mucous membranes; pharynx without lesions noted NECK: Supple, no meningismus, no LAD  CARD: RRR; S1 and S2 appreciated; no murmurs, no clicks, no rubs, no gallops RESP: Normal chest excursion without splinting or tachypnea; breath sounds clear and equal bilaterally but diminished at pts bases with poor inspiratory effort ; no wheezes, no rhonchi, no rales, no hypoxia or respiratory distress, speaking full sentences ABD/GI: Normal bowel sounds; non-distended; soft, non-tender, no rebound, no guarding, no peritoneal signs BACK:  The back appears normal and is non-tender to palpation, there is no CVA tenderness EXT: Normal ROM in all joints; non-tender to palpation; no edema; normal capillary refill; no cyanosis, no calf tenderness or swelling; pt has lost her right great toe nail with damage to nail bed but no laceration that needs repair no bony tenderness swelling or ecchymosis over right foot. 2+ DP pulses bilaterally SKIN: Normal color for age and race; warm, no rash or sign of cellulitis NEURO: Moves all extremities equally, sensation to light touch intact diffusely, cranial nerves II through XII intact; has a difficult time lifting her extremities off the bed and sitting upright in the bed but has no obvious focal  weakness PSYCH: The patient's mood and manner are appropriate. Grooming and personal hygiene are appropriate.  MEDICAL DECISION MAKING: Patient here with fever, increased falls, generalized weakness. Has history of frequent kidney infections/UTIs. Reports dysuria. Possible UTI today. No flank pain on exam. She is febrile but otherwise hemodynamically stable. Blood pressure slightly low. We'll give IV fluids and continue to closely monitor patient. Will obtain labs, chest x-ray, urine. Discussed with family of patient does not improve and is still unable to ambulate that she will need admission. States she normally ambulates without a cane or walker. Husband feels like he cannot care for her at home in this state.  ED PROGRESS: Patient has a leukocytosis of 13.1 with left shift. Lactate normal. Will give broad-spectrum antibiotics given she meets SIRS criteria. She is receiving IV fluids. Last echocardiogram was in 2013 with EF of 55-60%. Chest x-ray clear. Head CT shows no acute abnormalities.   2:30 AM  Called to patient's room she became unresponsive, apneic, cyanotic and had oxygen saturation of 40% on 2 L, blood pressure 69/43, heart rate of 42.  Patient has a normal blood glucose. EKG shows sinus rhythm with diffuse ST depression suggesting ischemia. Patient was not complaining of any chest pain or shortness of breath previously. Ventilated with BVM, dentures removed but sats not over 69%.  Patient's husband is at bedside. He states patient would not want chest compressions, electric shocks or intubation. Discussed with husband that if patient is not intubated that she will die. Bipap is not an option given pt has GCS of 3 and needs airway protection.  No seiure like activity witnessed.  He would like for [place] to stop further interventions and make patient comfortable. I do not suspect that she will survive much longer in the emergency department. Chaplain has been notified.   2:54 AM  Pt has expired.   No spontaneous respirations or pulse.  Will contact Dr. Donneta Romberg office for the death certificate.  She would not be a medical examiner case.  3:30 AM  Dr. Silvio Pate on call for Dr. Doug Sou.  PCP will  complete death certificate.     EKG Interpretation  Date/Time:  Sunday April 07 2015 23:53:05 EDT Ventricular Rate:  59 PR Interval:  128 QRS Duration: 101 QT Interval:  551 QTC Calculation: 546 R Axis:   15 Text Interpretation:  Sinus rhythm Atrial premature complex Prolonged QT interval No significant change since last tracing Confirmed by Tel Hevia,  DO, Haila Dena [phone]) on 2015/05/08 12:08:47 AM         EKG Interpretation  Date/Time:  05-08-15 02:30:11 EDT Ventricular Rate:  96 PR Interval:  144 QRS Duration: 108 QT Interval:  463 QTC Calculation: 585 R Axis:   110 Text Interpretation:  Sinus rhythm Probable left atrial enlargement Right axis deviation Repol abnrm, severe global ischemia (LM/MVD) Prolonged QT interval Confirmed by Abdiaziz Klahn,  DO, Greogory Cornette ([id number]) on May 08, 2015 2:42:40 AM         CRITICAL CARE Performed by: Nyra Jabs   Total critical care time: 60 minutes  Critical care time was exclusive of separately billable procedures and treating other patients.  Critical care was necessary to treat or prevent imminent or life-threatening deterioration.  Critical care was time spent personally by me on the following activities: development of treatment plan with patient and/or surrogate as well as nursing, discussions with consultants, evaluation of patient's response to treatment, examination of patient, obtaining history from patient or surrogate, ordering and performing treatments and interventions, ordering and review of laboratory studies, ordering and review of radiographic studies, pulse oximetry and re-evaluation of patient's condition.    I personally performed the services described in this documentation, which was scribed in my presence. The  recorded information has been reviewed and is accurate.     [place], DO 05-08-2015 [phone]

## 2015-04-07 NOTE — ED Notes (Signed)
Pt states she has been feeling weak and lethargic for past 3-4 days. Pt stated she has had a difficult time urinating in that time too. Pt stated that she has a history of kidney infections. Pt has denied any N/V, but has had some SOB. Pt with history of COPD. Pt with a fever of 101.8 upon arrival to ED.

## 2015-04-08 ENCOUNTER — Emergency Department (HOSPITAL_COMMUNITY): Payer: BLUE CROSS/BLUE SHIELD

## 2015-04-08 ENCOUNTER — Ambulatory Visit: Payer: BLUE CROSS/BLUE SHIELD | Admitting: Physician Assistant

## 2015-04-08 ENCOUNTER — Telehealth: Payer: Self-pay | Admitting: Internal Medicine

## 2015-04-08 LAB — CBC WITH DIFFERENTIAL/PLATELET
BASOS ABS: 0 10*3/uL (ref 0.0–0.1)
Basophils Relative: 0 % (ref 0–1)
EOS ABS: 0 10*3/uL (ref 0.0–0.7)
EOS PCT: 0 % (ref 0–5)
HCT: 31.8 % — ABNORMAL LOW (ref 36.0–46.0)
Hemoglobin: 10.3 g/dL — ABNORMAL LOW (ref 12.0–15.0)
Lymphocytes Relative: 11 % — ABNORMAL LOW (ref 12–46)
Lymphs Abs: 1.5 10*3/uL (ref 0.7–4.0)
MCH: 33.6 pg (ref 26.0–34.0)
MCHC: 32.4 g/dL (ref 30.0–36.0)
MCV: 103.6 fL — ABNORMAL HIGH (ref 78.0–100.0)
Monocytes Absolute: 1 10*3/uL (ref 0.1–1.0)
Monocytes Relative: 8 % (ref 3–12)
Neutro Abs: 10.6 10*3/uL — ABNORMAL HIGH (ref 1.7–7.7)
Neutrophils Relative %: 81 % — ABNORMAL HIGH (ref 43–77)
PLATELETS: 214 10*3/uL (ref 150–400)
RBC: 3.07 MIL/uL — AB (ref 3.87–5.11)
RDW: 14.7 % (ref 11.5–15.5)
WBC: 13.1 10*3/uL — AB (ref 4.0–10.5)

## 2015-04-08 LAB — COMPREHENSIVE METABOLIC PANEL
ALK PHOS: 54 U/L (ref 38–126)
ALT: 14 U/L (ref 14–54)
AST: 22 U/L (ref 15–41)
Albumin: 2.8 g/dL — ABNORMAL LOW (ref 3.5–5.0)
Anion gap: 11 (ref 5–15)
BILIRUBIN TOTAL: 1.4 mg/dL — AB (ref 0.3–1.2)
BUN: 13 mg/dL (ref 6–20)
CO2: 27 mmol/L (ref 22–32)
CREATININE: 1.81 mg/dL — AB (ref 0.44–1.00)
Calcium: 8.5 mg/dL — ABNORMAL LOW (ref 8.9–10.3)
Chloride: 96 mmol/L — ABNORMAL LOW (ref 101–111)
GFR calc Af Amer: 34 mL/min — ABNORMAL LOW (ref >60)
GFR, EST NON AFRICAN AMERICAN: 29 mL/min — AB (ref >60)
Glucose, Bld: 116 mg/dL — ABNORMAL HIGH (ref 65–99)
Potassium: 3.8 mmol/L (ref 3.5–5.1)
Sodium: 134 mmol/L — ABNORMAL LOW (ref 135–145)
TOTAL PROTEIN: 5.3 g/dL — AB (ref 6.5–8.1)

## 2015-04-08 LAB — I-STAT CG4 LACTIC ACID, ED: Lactic Acid, Venous: 1.65 mmol/L (ref 0.5–2.0)

## 2015-04-08 LAB — CBG MONITORING, ED: Glucose-Capillary: 136 mg/dL — ABNORMAL HIGH (ref 65–99)

## 2015-04-08 MED ORDER — SODIUM CHLORIDE 0.9 % IV SOLN: INTRAVENOUS | Status: DC

## 2015-04-08 MED ORDER — PIPERACILLIN-TAZOBACTAM 3.375 G IVPB: 3.3750 g | Freq: Three times a day (TID) | INTRAVENOUS | Status: DC

## 2015-04-08 MED ORDER — IPRATROPIUM-ALBUTEROL 0.5-2.5 (3) MG/3ML IN SOLN
3.0000 mL | Freq: Once | RESPIRATORY_TRACT | Status: AC
  Administered 2015-04-08: 3 mL via RESPIRATORY_TRACT
  Filled 2015-04-08: qty 6

## 2015-04-08 MED ORDER — VANCOMYCIN HCL 10 G IV SOLR: 1250.0000 mg | INTRAVENOUS | Status: DC

## 2015-04-08 MED ORDER — VANCOMYCIN HCL 10 G IV SOLR
2000.0000 mg | INTRAVENOUS | Status: DC
  Filled 2015-04-08: qty 2000

## 2015-04-08 MED ORDER — PIPERACILLIN-TAZOBACTAM 3.375 G IVPB 30 MIN
3.3750 g | INTRAVENOUS | Status: AC
  Administered 2015-04-08: 3.375 g via INTRAVENOUS
  Filled 2015-04-08: qty 50

## 2015-04-08 MED ORDER — ACETAMINOPHEN 500 MG PO TABS
1000.0000 mg | ORAL_TABLET | Freq: Once | ORAL | Status: AC
  Administered 2015-04-08: 1000 mg via ORAL
  Filled 2015-04-08: qty 2

## 2015-04-08 MED ORDER — MORPHINE SULFATE (PF) 4 MG/ML IV SOLN
4.0000 mg | Freq: Once | INTRAVENOUS | Status: AC
  Administered 2015-04-08: 4 mg via INTRAVENOUS
  Filled 2015-04-08: qty 1

## 2015-04-08 MED ORDER — SODIUM CHLORIDE 0.9 % IV BOLUS (SEPSIS)
1000.0000 mL | Freq: Once | INTRAVENOUS | Status: AC
Start: 2015-04-08 — End: 2015-04-08
  Administered 2015-04-08: 1000 mL via INTRAVENOUS

## 2015-04-10 ENCOUNTER — Telehealth: Payer: Self-pay

## 2015-04-10 LAB — CULTURE, BLOOD (ROUTINE X 2)

## 2015-04-10 NOTE — Telephone Encounter (Signed)
On 04/10/2015 I received a death certificate from [facility]. The death certificate is for cremation. The patient is a patient of Vertell Novak. The death certificate will be taken to the primary care unit this am for signature.  Received back 04-10-15 and called [facility] for pick-up.

## 2015-04-27 NOTE — Telephone Encounter (Signed)
Call from [facility] ER Came in several hours ago--very elevated white count and being worked up for sepsis About an hour ago--husband asked that no aggressive means be done and Rx scaled back She then died  Will notify Dr Doug Sou who will need to do the death certificate

## 2015-04-27 NOTE — Progress Notes (Signed)
ANTIBIOTIC CONSULT NOTE - INITIAL  Pharmacy Consult for Vancomycin and Zosyn Indication: sepsis  Allergies  Allergen Reactions  . Trazodone Hcl Anaphylaxis  . Cefpodoxime Proxetil Other (See Comments)    unknown  . Erythromycin Ethylsuccinate Nausea And Vomiting  . Lisinopril Other (See Comments)    Cough  . Nsaids     Other reaction(s): Other (See Comments) Kidney disease  . Sulfa Antibiotics Swelling  . Tolmetin     Other reaction(s): Other (See Comments) Unknown.  Marland Kitchen Cefpodoxime Proxetil Other (See Comments)    unknown  . Perphenazine Other (See Comments)    unknown unknown  . Prednisone Anxiety    Gets emotional.    Patient Measurements: Height: 5\' 6"  (167.6 cm) Weight: 249 lb (112.946 kg) IBW/kg (Calculated) : 59.3  Vital Signs: Temp: 101.8 F (38.8 C) (09/11 2339) BP: 100/44 mmHg (09/11 2345) Pulse Rate: 56 (09/11 2345) Intake/Output from previous day:   Intake/Output from this shift:    Labs:  Recent Labs  2015-04-09 0111  WBC 13.1*  HGB 10.3*  PLT 214   CrCl cannot be calculated (Patient has no serum creatinine result on file.). No results for input(s): VANCOTROUGH, VANCOPEAK, VANCORANDOM, GENTTROUGH, GENTPEAK, GENTRANDOM, TOBRATROUGH, TOBRAPEAK, TOBRARND, AMIKACINPEAK, AMIKACINTROU, AMIKACIN in the last 72 hours.   Microbiology: No results found for this or any previous visit (from the past 720 hour(s)).  Medical History: Past Medical History  Diagnosis Date  . Anemia, unspecified   . Major depression   . Narcotic abuse in remission   . Bipolar disorder, unspecified   . Dissociative identity disorder   . HTN (hypertension), benign     a. meds d/c'd by PCP 11/13 due to low BP in setting of significant weight loss and poor appetite related to esophagitis (Toprol restarted late Nov 2013)  . HLD (hyperlipidemia)   . GERD (gastroesophageal reflux disease)   . CAD (coronary artery disease)     a. Cypher DES to mLAD in 2007; b. MV 3/09: EF 72%,  no ischemia/infarction.; c. MV (12/11) EF 73%, ? Ant soft tissue atten, no ischemia/infarction; d. LHC (1/12): patent LAD stent, mild LIs only; e. Lex MV (6/13): no ischemia or infarction.   . Hiatal hernia   . COPD (chronic obstructive pulmonary disease)     a. quit smoking 06/2010; b. PFTs (4/10):  FEV1 66%, ratio 64%, DLCO 67%  . Palpitations     a. Holter (12/11) with occasional PVCs. Holter (11/12): rare PACs and PVCs.  Marland Kitchen Hx of echocardiogram     a. Echo (1/08): EF 65%, no significant valvular abnormalities.; b. Echo (12/11): EF 55-60%, grade II diast dysfn, normal wall motion, mild LAE.; c. Echo (5/13) with EF 55-60%, mild LVH.   Marland Kitchen Esophageal stricture     a. s/p dilatation 7/12;  b. EGD 04/2012 with severe esophagitis and stricture (PPI started and dilatation deferred for several weeks)  . H/O orthostatic hypotension     probably related to psych meds  . Dysphagia   . Chronic kidney disease   . Myocardial infarction 2007    stent placed    Medications:  Awaiting med rec  Assessment: 58 y.o. female presents with weakness, fever, falls, and dysuria. Pt reports h/o kidney infections. Tm 101.8. WBC elevated to 13.1. To begin vancomycin and zosyn for sepsis. Scr 1.81, est normalized CrCl 35-40 ml/min.   Goal of Therapy:  Vancomycin trough level 15-20 mcg/ml  Plan:  Zosyn 3.375gm IV now over 30 min then 3.375gm IV q8h -  subsequent doses over 4 hours Vancomycin 2gm IV now then 1250mg  IV q24h Will f/u micro data, renal function, and pt's clinical condition Vanc trough at Css   Sherlon Handing, PharmD, BCPS Clinical pharmacist, pager [phone] 05/05/15,1:55 AM

## 2015-04-27 NOTE — Telephone Encounter (Signed)
Patients son called in this morning.  Stated that mother past this morning at [facility].

## 2015-04-27 NOTE — ED Notes (Signed)
Time of death called by  Dr. Leonides Schanz at the bedside at [phone]

## 2015-04-27 NOTE — ED Notes (Signed)
Pt's temp has decreased from  101.8 to 99.6 at this time.

## 2015-04-27 NOTE — ED Notes (Signed)
Pt expired at 0254 and pronounced by Dr. Leonides Schanz.

## 2015-04-27 DEATH — deceased

## 2015-05-15 ENCOUNTER — Other Ambulatory Visit: Payer: BLUE CROSS/BLUE SHIELD

## 2015-05-24 ENCOUNTER — Ambulatory Visit: Payer: BLUE CROSS/BLUE SHIELD | Admitting: Internal Medicine

## 2015-07-17 ENCOUNTER — Encounter: Payer: Self-pay | Admitting: Geriatric Medicine
